# Patient Record
Sex: Female | Born: 1935
Health system: Southern US, Community
[De-identification: ages and names within clinical notes are randomized; demographics above are authoritative.]

## PROBLEM LIST (undated history)

## (undated) DIAGNOSIS — R51 Headache: Secondary | ICD-10-CM

## (undated) DIAGNOSIS — R112 Nausea with vomiting, unspecified: Secondary | ICD-10-CM

## (undated) DIAGNOSIS — R519 Headache, unspecified: Secondary | ICD-10-CM

## (undated) DIAGNOSIS — E785 Hyperlipidemia, unspecified: Secondary | ICD-10-CM

## (undated) DIAGNOSIS — K219 Gastro-esophageal reflux disease without esophagitis: Secondary | ICD-10-CM

## (undated) DIAGNOSIS — J189 Pneumonia, unspecified organism: Secondary | ICD-10-CM

## (undated) DIAGNOSIS — Z9889 Other specified postprocedural states: Secondary | ICD-10-CM

## (undated) DIAGNOSIS — M1990 Unspecified osteoarthritis, unspecified site: Secondary | ICD-10-CM

## (undated) HISTORY — DX: Headache, unspecified: R51.9

## (undated) HISTORY — PX: ANKLE SURGERY: SHX546

## (undated) HISTORY — DX: Gastro-esophageal reflux disease without esophagitis: K21.9

## (undated) HISTORY — PX: ABDOMINAL HYSTERECTOMY: SHX81

## (undated) HISTORY — DX: Unspecified osteoarthritis, unspecified site: M19.90

## (undated) HISTORY — DX: Hyperlipidemia, unspecified: E78.5

## (undated) HISTORY — PX: EYE SURGERY: SHX253

## (undated) HISTORY — DX: Headache: R51

## (undated) HISTORY — PX: WISDOM TOOTH EXTRACTION: SHX21

---
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
## 1998-05-08 HISTORY — PX: OTHER SURGICAL HISTORY: SHX169

## 2011-05-09 HISTORY — PX: OTHER SURGICAL HISTORY: SHX169

## 2012-09-18 LAB — PULMONARY FUNCTION TEST

## 2013-05-19 DIAGNOSIS — M1610 Unilateral primary osteoarthritis, unspecified hip: Secondary | ICD-10-CM | POA: Insufficient documentation

## 2015-06-15 DIAGNOSIS — Z96649 Presence of unspecified artificial hip joint: Secondary | ICD-10-CM | POA: Insufficient documentation

## 2015-06-15 DIAGNOSIS — M7062 Trochanteric bursitis, left hip: Secondary | ICD-10-CM | POA: Insufficient documentation

## 2016-05-09 DIAGNOSIS — M545 Low back pain: Secondary | ICD-10-CM | POA: Diagnosis not present

## 2016-05-09 DIAGNOSIS — M9903 Segmental and somatic dysfunction of lumbar region: Secondary | ICD-10-CM | POA: Diagnosis not present

## 2016-05-09 DIAGNOSIS — M791 Myalgia: Secondary | ICD-10-CM | POA: Diagnosis not present

## 2016-05-09 DIAGNOSIS — M6240 Contracture of muscle, unspecified site: Secondary | ICD-10-CM | POA: Diagnosis not present

## 2016-05-15 DIAGNOSIS — M9903 Segmental and somatic dysfunction of lumbar region: Secondary | ICD-10-CM | POA: Diagnosis not present

## 2016-05-15 DIAGNOSIS — M791 Myalgia: Secondary | ICD-10-CM | POA: Diagnosis not present

## 2016-05-15 DIAGNOSIS — M545 Low back pain: Secondary | ICD-10-CM | POA: Diagnosis not present

## 2016-05-15 DIAGNOSIS — M6240 Contracture of muscle, unspecified site: Secondary | ICD-10-CM | POA: Diagnosis not present

## 2016-05-18 DIAGNOSIS — M6240 Contracture of muscle, unspecified site: Secondary | ICD-10-CM | POA: Diagnosis not present

## 2016-05-18 DIAGNOSIS — M545 Low back pain: Secondary | ICD-10-CM | POA: Diagnosis not present

## 2016-05-18 DIAGNOSIS — M791 Myalgia: Secondary | ICD-10-CM | POA: Diagnosis not present

## 2016-05-18 DIAGNOSIS — M9903 Segmental and somatic dysfunction of lumbar region: Secondary | ICD-10-CM | POA: Diagnosis not present

## 2016-05-22 DIAGNOSIS — M6240 Contracture of muscle, unspecified site: Secondary | ICD-10-CM | POA: Diagnosis not present

## 2016-05-22 DIAGNOSIS — M791 Myalgia: Secondary | ICD-10-CM | POA: Diagnosis not present

## 2016-05-22 DIAGNOSIS — M545 Low back pain: Secondary | ICD-10-CM | POA: Diagnosis not present

## 2016-05-22 DIAGNOSIS — M9903 Segmental and somatic dysfunction of lumbar region: Secondary | ICD-10-CM | POA: Diagnosis not present

## 2016-05-29 DIAGNOSIS — J209 Acute bronchitis, unspecified: Secondary | ICD-10-CM | POA: Diagnosis not present

## 2016-06-08 DIAGNOSIS — M9903 Segmental and somatic dysfunction of lumbar region: Secondary | ICD-10-CM | POA: Diagnosis not present

## 2016-07-06 DIAGNOSIS — B351 Tinea unguium: Secondary | ICD-10-CM | POA: Diagnosis not present

## 2016-07-06 DIAGNOSIS — M79675 Pain in left toe(s): Secondary | ICD-10-CM | POA: Diagnosis not present

## 2016-07-06 DIAGNOSIS — M9903 Segmental and somatic dysfunction of lumbar region: Secondary | ICD-10-CM | POA: Diagnosis not present

## 2016-07-06 DIAGNOSIS — M79674 Pain in right toe(s): Secondary | ICD-10-CM | POA: Diagnosis not present

## 2016-08-17 DIAGNOSIS — Z79899 Other long term (current) drug therapy: Secondary | ICD-10-CM | POA: Diagnosis not present

## 2016-08-17 DIAGNOSIS — E782 Mixed hyperlipidemia: Secondary | ICD-10-CM | POA: Diagnosis not present

## 2016-08-18 LAB — CBC AND DIFFERENTIAL
HCT: 42 (ref 36–46)
Hemoglobin: 13.7 (ref 12.0–16.0)
Platelets: 235 (ref 150–399)
WBC: 4.8

## 2016-08-24 DIAGNOSIS — M545 Low back pain: Secondary | ICD-10-CM | POA: Diagnosis not present

## 2016-08-24 DIAGNOSIS — T7840XA Allergy, unspecified, initial encounter: Secondary | ICD-10-CM | POA: Diagnosis not present

## 2016-08-24 DIAGNOSIS — E782 Mixed hyperlipidemia: Secondary | ICD-10-CM | POA: Diagnosis not present

## 2016-08-24 DIAGNOSIS — M810 Age-related osteoporosis without current pathological fracture: Secondary | ICD-10-CM | POA: Diagnosis not present

## 2016-08-29 DIAGNOSIS — M5416 Radiculopathy, lumbar region: Secondary | ICD-10-CM | POA: Diagnosis not present

## 2016-09-06 DIAGNOSIS — M47816 Spondylosis without myelopathy or radiculopathy, lumbar region: Secondary | ICD-10-CM | POA: Diagnosis not present

## 2016-09-06 DIAGNOSIS — M5417 Radiculopathy, lumbosacral region: Secondary | ICD-10-CM | POA: Diagnosis not present

## 2016-09-15 DIAGNOSIS — B351 Tinea unguium: Secondary | ICD-10-CM | POA: Diagnosis not present

## 2016-09-15 DIAGNOSIS — M79675 Pain in left toe(s): Secondary | ICD-10-CM | POA: Diagnosis not present

## 2016-09-15 DIAGNOSIS — M79674 Pain in right toe(s): Secondary | ICD-10-CM | POA: Diagnosis not present

## 2016-10-19 DIAGNOSIS — M48062 Spinal stenosis, lumbar region with neurogenic claudication: Secondary | ICD-10-CM | POA: Diagnosis not present

## 2016-10-19 DIAGNOSIS — M25559 Pain in unspecified hip: Secondary | ICD-10-CM | POA: Diagnosis not present

## 2016-10-19 DIAGNOSIS — M4807 Spinal stenosis, lumbosacral region: Secondary | ICD-10-CM | POA: Diagnosis not present

## 2016-10-19 DIAGNOSIS — M545 Low back pain: Secondary | ICD-10-CM | POA: Diagnosis not present

## 2016-10-19 DIAGNOSIS — M79605 Pain in left leg: Secondary | ICD-10-CM | POA: Diagnosis not present

## 2016-11-17 DIAGNOSIS — M79675 Pain in left toe(s): Secondary | ICD-10-CM | POA: Diagnosis not present

## 2016-11-17 DIAGNOSIS — M79674 Pain in right toe(s): Secondary | ICD-10-CM | POA: Diagnosis not present

## 2016-11-17 DIAGNOSIS — B351 Tinea unguium: Secondary | ICD-10-CM | POA: Diagnosis not present

## 2016-11-28 DIAGNOSIS — M5416 Radiculopathy, lumbar region: Secondary | ICD-10-CM | POA: Diagnosis not present

## 2016-11-30 DIAGNOSIS — K449 Diaphragmatic hernia without obstruction or gangrene: Secondary | ICD-10-CM | POA: Diagnosis not present

## 2016-11-30 DIAGNOSIS — D131 Benign neoplasm of stomach: Secondary | ICD-10-CM | POA: Diagnosis not present

## 2016-11-30 DIAGNOSIS — Z0389 Encounter for observation for other suspected diseases and conditions ruled out: Secondary | ICD-10-CM | POA: Diagnosis not present

## 2016-11-30 DIAGNOSIS — Z8719 Personal history of other diseases of the digestive system: Secondary | ICD-10-CM | POA: Diagnosis not present

## 2016-11-30 DIAGNOSIS — K2970 Gastritis, unspecified, without bleeding: Secondary | ICD-10-CM | POA: Diagnosis not present

## 2016-11-30 DIAGNOSIS — K317 Polyp of stomach and duodenum: Secondary | ICD-10-CM | POA: Diagnosis not present

## 2016-11-30 DIAGNOSIS — K2270 Barrett's esophagus without dysplasia: Secondary | ICD-10-CM | POA: Diagnosis not present

## 2016-11-30 DIAGNOSIS — K219 Gastro-esophageal reflux disease without esophagitis: Secondary | ICD-10-CM | POA: Diagnosis not present

## 2016-11-30 DIAGNOSIS — K2950 Unspecified chronic gastritis without bleeding: Secondary | ICD-10-CM | POA: Diagnosis not present

## 2016-11-30 DIAGNOSIS — B9681 Helicobacter pylori [H. pylori] as the cause of diseases classified elsewhere: Secondary | ICD-10-CM | POA: Diagnosis not present

## 2016-12-01 DIAGNOSIS — M5416 Radiculopathy, lumbar region: Secondary | ICD-10-CM | POA: Diagnosis not present

## 2017-01-04 DIAGNOSIS — H3540 Unspecified peripheral retinal degeneration: Secondary | ICD-10-CM | POA: Diagnosis not present

## 2017-01-04 DIAGNOSIS — H43813 Vitreous degeneration, bilateral: Secondary | ICD-10-CM | POA: Diagnosis not present

## 2017-01-26 DIAGNOSIS — M79674 Pain in right toe(s): Secondary | ICD-10-CM | POA: Diagnosis not present

## 2017-01-26 DIAGNOSIS — B351 Tinea unguium: Secondary | ICD-10-CM | POA: Diagnosis not present

## 2017-01-26 DIAGNOSIS — M79675 Pain in left toe(s): Secondary | ICD-10-CM | POA: Diagnosis not present

## 2017-02-08 DIAGNOSIS — B9681 Helicobacter pylori [H. pylori] as the cause of diseases classified elsewhere: Secondary | ICD-10-CM | POA: Diagnosis not present

## 2017-02-08 DIAGNOSIS — K219 Gastro-esophageal reflux disease without esophagitis: Secondary | ICD-10-CM | POA: Diagnosis not present

## 2017-02-09 DIAGNOSIS — B9681 Helicobacter pylori [H. pylori] as the cause of diseases classified elsewhere: Secondary | ICD-10-CM | POA: Diagnosis not present

## 2017-02-21 DIAGNOSIS — Z23 Encounter for immunization: Secondary | ICD-10-CM | POA: Diagnosis not present

## 2017-02-22 DIAGNOSIS — E782 Mixed hyperlipidemia: Secondary | ICD-10-CM | POA: Diagnosis not present

## 2017-02-22 DIAGNOSIS — M810 Age-related osteoporosis without current pathological fracture: Secondary | ICD-10-CM | POA: Diagnosis not present

## 2017-02-22 DIAGNOSIS — Z1231 Encounter for screening mammogram for malignant neoplasm of breast: Secondary | ICD-10-CM | POA: Diagnosis not present

## 2017-02-22 DIAGNOSIS — Z79899 Other long term (current) drug therapy: Secondary | ICD-10-CM | POA: Diagnosis not present

## 2017-02-22 DIAGNOSIS — F320 Major depressive disorder, single episode, mild: Secondary | ICD-10-CM | POA: Diagnosis not present

## 2017-02-22 DIAGNOSIS — Z0001 Encounter for general adult medical examination with abnormal findings: Secondary | ICD-10-CM | POA: Diagnosis not present

## 2017-02-23 LAB — LIPID PANEL
Cholesterol: 172 (ref 0–200)
HDL: 89 — AB (ref 35–70)
LDL CALC: 67
Triglycerides: 81 (ref 40–160)

## 2017-02-23 LAB — BASIC METABOLIC PANEL
BUN: 19 (ref 4–21)
Creatinine: 0.6 (ref 0.5–1.1)
GLUCOSE: 88
POTASSIUM: 3.9 (ref 3.4–5.3)
SODIUM: 143 (ref 137–147)

## 2017-02-23 LAB — HEPATIC FUNCTION PANEL
ALT: 16 (ref 7–35)
AST: 27 (ref 13–35)
Alkaline Phosphatase: 64 (ref 25–125)
BILIRUBIN, TOTAL: 0.7

## 2017-03-01 DIAGNOSIS — Z79899 Other long term (current) drug therapy: Secondary | ICD-10-CM | POA: Diagnosis not present

## 2017-03-01 DIAGNOSIS — M810 Age-related osteoporosis without current pathological fracture: Secondary | ICD-10-CM | POA: Diagnosis not present

## 2017-03-01 DIAGNOSIS — E782 Mixed hyperlipidemia: Secondary | ICD-10-CM | POA: Diagnosis not present

## 2017-03-01 DIAGNOSIS — K2270 Barrett's esophagus without dysplasia: Secondary | ICD-10-CM | POA: Diagnosis not present

## 2017-03-20 DIAGNOSIS — Z1231 Encounter for screening mammogram for malignant neoplasm of breast: Secondary | ICD-10-CM | POA: Diagnosis not present

## 2017-03-20 LAB — HM MAMMOGRAPHY

## 2017-03-22 DIAGNOSIS — H52223 Regular astigmatism, bilateral: Secondary | ICD-10-CM | POA: Diagnosis not present

## 2017-03-22 DIAGNOSIS — Z961 Presence of intraocular lens: Secondary | ICD-10-CM | POA: Diagnosis not present

## 2017-03-22 DIAGNOSIS — H43393 Other vitreous opacities, bilateral: Secondary | ICD-10-CM | POA: Diagnosis not present

## 2017-03-22 DIAGNOSIS — H524 Presbyopia: Secondary | ICD-10-CM | POA: Diagnosis not present

## 2017-03-22 DIAGNOSIS — H31091 Other chorioretinal scars, right eye: Secondary | ICD-10-CM | POA: Diagnosis not present

## 2017-03-22 DIAGNOSIS — H1045 Other chronic allergic conjunctivitis: Secondary | ICD-10-CM | POA: Diagnosis not present

## 2017-03-23 DIAGNOSIS — L814 Other melanin hyperpigmentation: Secondary | ICD-10-CM | POA: Diagnosis not present

## 2017-03-23 DIAGNOSIS — L821 Other seborrheic keratosis: Secondary | ICD-10-CM | POA: Diagnosis not present

## 2017-03-23 DIAGNOSIS — D1801 Hemangioma of skin and subcutaneous tissue: Secondary | ICD-10-CM | POA: Diagnosis not present

## 2017-04-02 DIAGNOSIS — M79674 Pain in right toe(s): Secondary | ICD-10-CM | POA: Diagnosis not present

## 2017-04-02 DIAGNOSIS — M79675 Pain in left toe(s): Secondary | ICD-10-CM | POA: Diagnosis not present

## 2017-04-02 DIAGNOSIS — Z6826 Body mass index (BMI) 26.0-26.9, adult: Secondary | ICD-10-CM | POA: Diagnosis not present

## 2017-04-02 DIAGNOSIS — B351 Tinea unguium: Secondary | ICD-10-CM | POA: Diagnosis not present

## 2017-04-18 DIAGNOSIS — M810 Age-related osteoporosis without current pathological fracture: Secondary | ICD-10-CM | POA: Diagnosis not present

## 2017-06-04 DIAGNOSIS — M79674 Pain in right toe(s): Secondary | ICD-10-CM | POA: Diagnosis not present

## 2017-06-04 DIAGNOSIS — B351 Tinea unguium: Secondary | ICD-10-CM | POA: Diagnosis not present

## 2017-06-04 DIAGNOSIS — M79675 Pain in left toe(s): Secondary | ICD-10-CM | POA: Diagnosis not present

## 2017-06-04 DIAGNOSIS — Z6826 Body mass index (BMI) 26.0-26.9, adult: Secondary | ICD-10-CM | POA: Diagnosis not present

## 2017-08-06 DIAGNOSIS — Z6826 Body mass index (BMI) 26.0-26.9, adult: Secondary | ICD-10-CM | POA: Diagnosis not present

## 2017-08-06 DIAGNOSIS — M79675 Pain in left toe(s): Secondary | ICD-10-CM | POA: Diagnosis not present

## 2017-08-06 DIAGNOSIS — M79674 Pain in right toe(s): Secondary | ICD-10-CM | POA: Diagnosis not present

## 2017-08-06 DIAGNOSIS — B351 Tinea unguium: Secondary | ICD-10-CM | POA: Diagnosis not present

## 2017-08-06 DIAGNOSIS — M810 Age-related osteoporosis without current pathological fracture: Secondary | ICD-10-CM | POA: Diagnosis not present

## 2017-08-06 DIAGNOSIS — E782 Mixed hyperlipidemia: Secondary | ICD-10-CM | POA: Diagnosis not present

## 2017-08-06 DIAGNOSIS — Z79899 Other long term (current) drug therapy: Secondary | ICD-10-CM | POA: Diagnosis not present

## 2017-08-14 DIAGNOSIS — M810 Age-related osteoporosis without current pathological fracture: Secondary | ICD-10-CM | POA: Diagnosis not present

## 2017-08-14 DIAGNOSIS — E782 Mixed hyperlipidemia: Secondary | ICD-10-CM | POA: Diagnosis not present

## 2017-08-21 DIAGNOSIS — M5416 Radiculopathy, lumbar region: Secondary | ICD-10-CM | POA: Diagnosis not present

## 2017-09-10 ENCOUNTER — Encounter: Payer: Self-pay | Admitting: Family Medicine

## 2017-09-10 ENCOUNTER — Ambulatory Visit (INDEPENDENT_AMBULATORY_CARE_PROVIDER_SITE_OTHER): Payer: Medicare Other | Admitting: Family Medicine

## 2017-09-10 VITALS — BP 112/68 | HR 71 | Temp 98.1°F | Ht 63.5 in | Wt 148.5 lb

## 2017-09-10 DIAGNOSIS — Z7689 Persons encountering health services in other specified circumstances: Secondary | ICD-10-CM

## 2017-09-10 DIAGNOSIS — K219 Gastro-esophageal reflux disease without esophagitis: Secondary | ICD-10-CM

## 2017-09-10 DIAGNOSIS — L600 Ingrowing nail: Secondary | ICD-10-CM

## 2017-09-10 DIAGNOSIS — M1990 Unspecified osteoarthritis, unspecified site: Secondary | ICD-10-CM

## 2017-09-10 DIAGNOSIS — E785 Hyperlipidemia, unspecified: Secondary | ICD-10-CM

## 2017-09-10 DIAGNOSIS — B351 Tinea unguium: Secondary | ICD-10-CM

## 2017-09-10 DIAGNOSIS — E2839 Other primary ovarian failure: Secondary | ICD-10-CM

## 2017-09-10 NOTE — Progress Notes (Signed)
Subjective:    Patient ID: Sherry Lamb, female    DOB: 01-21-1935, 83 y.o.   MRN: [id number]  HPI This is an 83 yo female who presents today to establish care. She is accompanied by her husband who is also establishing care. She has recently moved to the area from [facility] to be close to family.   Last CPE- two weeks ago Mammo- 10/18  Stomach has been upset with move. Feels unsettled. Takes omeprazole daily. Had EGD/colonscopy, was dx with h. Pylori and had treatment. Improvement of symptoms following treatment.   Intermittent back and hip pain- has done well in past with occasional injections. Requesting names of someone who may be able to help her if she has a flare.   Requesting referral to podiatrist for thickened nails/pain of feet.   She denies chest pain, SOB, fatigue, nausea/vomiting/diarrhea/constipation.   Past Medical History:  Diagnosis Date  . Arthritis   . Frequent headaches   . GERD (gastroesophageal reflux disease)   . Hyperlipidemia    Past Surgical History:  Procedure Laterality Date  . ABDOMINAL HYSTERECTOMY    . EYE SURGERY    . Left hip replacement Left 2013  . right hip replacement Right 2000  . TONSILLECTOMY AND ADENOIDECTOMY  1943     Family History  Problem Relation Age of Onset  . Arthritis Mother   . Asthma Mother   . Hearing loss Mother   . Early death Father   . Heart disease Father   . Hyperlipidemia Father   . Kidney disease Father   . Depression Brother   . Hyperlipidemia Brother    Social History   Tobacco Use  . Smoking status: Never Smoker  . Smokeless tobacco: Never Used  Substance Use Topics  . Alcohol use: Yes  . Drug use: Never       Review of Systems Per HPI    Objective:   Physical Exam  Constitutional: She is oriented to person, place, and time. She appears well-developed and well-nourished.  HENT:  Head: Normocephalic and atraumatic.  Eyes: Conjunctivae are normal.  Cardiovascular: Normal rate, regular  rhythm and normal heart sounds.  Pulmonary/Chest: Effort normal and breath sounds normal.  Musculoskeletal: She exhibits no edema.  Neurological: She is alert and oriented to person, place, and time.  Skin: Skin is warm and dry.  Psychiatric: She has a normal mood and affect. Her behavior is normal. Thought content normal.  Vitals reviewed.    BP 112/68 (BP Location: Right Arm, Patient Position: Sitting, Cuff Size: Normal)   Pulse 71   Temp 98.1 F (36.7 C) (Oral)   Ht 5' 3.5" (1.613 m)   Wt 148 lb 8 oz (67.4 kg)   SpO2 96%   BMI 25.89 kg/m   Depression screen [facility] 2/9 09/10/2017  Decreased Interest 0  Down, Depressed, Hopeless 0  PHQ - 2 Score 0       Assessment & Plan:  1. Encounter to establish care - will request records   2. Onychomycosis with ingrown toenail - Ambulatory referral to Podiatry  3. Estrogen deficiency - DG Bone Density; Future  4. Hyperlipidemia, unspecified hyperlipidemia type - has had recent labs, currently on rosuvastatin 10 mg  5. Gastroesophageal reflux disease, esophagitis presence not specified - continue omeprazole 20 mg, avoid triggers  6. Arthritis - currently pain well controlled, function good - provided her with names of local physiatrists and offered referral. She will let me know if she needs referral in future.   -  follow up in 6 months  Clarene Reamer, FNP-BC  [facility], [place]  09/16/2017 8:55 PM

## 2017-09-10 NOTE — Patient Instructions (Signed)
Good to meet you today  I have placed a referral for podiatry and order for bone scan, please stop at front desk to schedule  Please follow up in 6 months  Physiatrists in the area-  Beemer, Suella Broad, Laroy Apple, [facility]- [phone]

## 2017-09-12 ENCOUNTER — Telehealth: Payer: Self-pay | Admitting: Family Medicine

## 2017-09-12 NOTE — Telephone Encounter (Signed)
Patient brought by handicapped placard form.  Patient has had 2 hip replacements.  Patient asked for form to be faxed to the [facility] at fax number [phone] when form is complete. Form is in rx tower.

## 2017-09-12 NOTE — Telephone Encounter (Signed)
Noted  

## 2017-09-12 NOTE — Telephone Encounter (Signed)
Called and made patient aware that form has been completed and faxed. Form placed up front for patient to pick up. Nothing further needed.

## 2017-09-16 ENCOUNTER — Encounter: Payer: Self-pay | Admitting: Family Medicine

## 2017-09-16 DIAGNOSIS — E785 Hyperlipidemia, unspecified: Secondary | ICD-10-CM | POA: Insufficient documentation

## 2017-09-16 DIAGNOSIS — R51 Headache: Secondary | ICD-10-CM

## 2017-09-16 DIAGNOSIS — K219 Gastro-esophageal reflux disease without esophagitis: Secondary | ICD-10-CM | POA: Insufficient documentation

## 2017-09-16 DIAGNOSIS — M1990 Unspecified osteoarthritis, unspecified site: Secondary | ICD-10-CM | POA: Insufficient documentation

## 2017-09-16 DIAGNOSIS — R519 Headache, unspecified: Secondary | ICD-10-CM | POA: Insufficient documentation

## 2017-09-18 ENCOUNTER — Other Ambulatory Visit: Payer: Self-pay | Admitting: Family Medicine

## 2017-09-18 DIAGNOSIS — Z1231 Encounter for screening mammogram for malignant neoplasm of breast: Secondary | ICD-10-CM

## 2017-10-03 ENCOUNTER — Encounter: Payer: Self-pay | Admitting: Family Medicine

## 2017-10-04 ENCOUNTER — Ambulatory Visit (INDEPENDENT_AMBULATORY_CARE_PROVIDER_SITE_OTHER): Payer: Medicare Other | Admitting: Internal Medicine

## 2017-10-04 ENCOUNTER — Encounter: Payer: Self-pay | Admitting: Internal Medicine

## 2017-10-04 VITALS — BP 114/74 | HR 84 | Temp 98.1°F | Wt 147.0 lb

## 2017-10-04 DIAGNOSIS — J3089 Other allergic rhinitis: Secondary | ICD-10-CM | POA: Diagnosis not present

## 2017-10-05 ENCOUNTER — Encounter: Payer: Self-pay | Admitting: Internal Medicine

## 2017-10-05 ENCOUNTER — Ambulatory Visit: Payer: Medicare Other | Admitting: Family Medicine

## 2017-10-05 ENCOUNTER — Ambulatory Visit: Payer: Medicare Other | Admitting: Internal Medicine

## 2017-10-05 NOTE — Progress Notes (Signed)
HPI  Pt presents to the clinic today with c/o nasal congestion, ear fullness and cough. Shee reports this started 2 weeks ago. Shee is blowing clear mucous out of his nose. She denies ear pain or decreased hearing. The cough is non productive. She denies fever, chills or body aches. She has tried nasal saline and an antihistamine OTC. She has no history of allergies. She has not had sick contacts that she is aware of.  Review of Systems      Past Medical History:  Diagnosis Date  . Arthritis   . Frequent headaches   . GERD (gastroesophageal reflux disease)   . Hyperlipidemia     Family History  Problem Relation Age of Onset  . Arthritis Mother   . Asthma Mother   . Hearing loss Mother   . Early death Father   . Heart disease Father   . Hyperlipidemia Father   . Kidney disease Father   . Depression Brother   . Hyperlipidemia Brother     Social History   Socioeconomic History  . Marital status: Married    Spouse name: Not on file  . Number of children: Not on file  . Years of education: Not on file  . Highest education level: Not on file  Occupational History  . Not on file  Social Needs  . Financial resource strain: Not on file  . Food insecurity:    Worry: Not on file    Inability: Not on file  . Transportation needs:    Medical: Not on file    Non-medical: Not on file  Tobacco Use  . Smoking status: Never Smoker  . Smokeless tobacco: Never Used  Substance and Sexual Activity  . Alcohol use: Yes  . Drug use: Never  . Sexual activity: Yes    Partners: Male  Lifestyle  . Physical activity:    Days per week: Not on file    Minutes per session: Not on file  . Stress: Not on file  Relationships  . Social connections:    Talks on phone: Not on file    Gets together: Not on file    Attends religious service: Not on file    Active member of club or organization: Not on file    Attends meetings of clubs or organizations: Not on file    Relationship status:  Not on file  . Intimate partner violence:    Fear of current or ex partner: Not on file    Emotionally abused: Not on file    Physically abused: Not on file    Forced sexual activity: Not on file  Other Topics Concern  . Not on file  Social History Narrative   Married to [employer] [facility] from [facility] 2019 to be closer to family    Allergies  Allergen Reactions  . Penicillins Other (See Comments)    Stomach tightness.     Constitutional:Denies headache, fatigue, fever or abrupt weight changes.  HEENT:  Positive ear fullness, nasal congestion. Denies eye redness, eye pain, pressure behind the eyes, facial pain, ear pain, ringing in the ears, wax buildup, runny nose or bloody nose. Respiratory: Positive cough. Denies difficulty breathing or shortness of breath.  Cardiovascular: Denies chest pain, chest tightness, palpitations or swelling in the hands or feet.   No other specific complaints in a complete review of systems (except as listed in HPI above).  Objective:   BP 114/74   Pulse 84   Temp 98.1  F (36.7 C) (Oral)   Wt 147 lb (66.7 kg)   SpO2 97%   BMI 25.63 kg/m  Wt Readings from Last 3 Encounters:  10/04/17 147 lb (66.7 kg)  09/10/17 148 lb 8 oz (67.4 kg)     General: Appears her stated age, in NAD. HEENT: Head: normal shape and size;  Ears: Tm's gray and intact, normal light reflex; Nose: mucosa pink and moist, septum midline; Throat/Mouth: + PND. Teeth present, mucosa erythematous and moist, no exudate noted, no lesions or ulcerations noted.  Neck: No cervical lymphadenopathy.  Pulmonary/Chest: Normal effort and positive vesicular breath sounds. No respiratory distress. No wheezes, rales or ronchi noted.       Assessment & Plan:   Allergies:  Get some rest and drink plenty of water Encouraged her to continue antihistamine OTC Advised her to try Flonase instead of nasal saline  RTC as needed or if symptoms persist.   Webb Silversmith, NP

## 2017-10-05 NOTE — Patient Instructions (Signed)
Allergies An allergy is when your body reacts to a substance in a way that is not normal. An allergic reaction can happen after you:  Eat something.  Breathe in something.  Touch something.  You can be allergic to:  Things that are only around during certain seasons, like molds and pollens.  Foods.  Drugs.  Insects.  Animal dander.  What are the signs or symptoms?  Puffiness (swelling). This may happen on the lips, face, tongue, mouth, or throat.  Sneezing.  Coughing.  Breathing loudly (wheezing).  Stuffy nose.  Tingling in the mouth.  A rash.  Itching.  Itchy, red, puffy areas of skin (hives).  Watery eyes.  Throwing up (vomiting).  Watery poop (diarrhea).  Dizziness.  Feeling faint or fainting.  Trouble breathing or swallowing.  A tight feeling in the chest.  A fast heartbeat. How is this diagnosed? Allergies can be diagnosed with:  A medical and family history.  Skin tests.  Blood tests.  A food diary. A food diary is a record of all the foods, drinks, and symptoms you have each day.  The results of an elimination diet. This diet involves making sure not to eat certain foods and then seeing what happens when you start eating them again.  How is this treated? There is no cure for allergies, but allergic reactions can be treated with medicine. Severe reactions usually need to be treated at a hospital. How is this prevented? The best way to prevent an allergic reaction is to avoid the thing you are allergic to. Allergy shots and medicines can also help prevent reactions in some cases. This information is not intended to replace advice given to you by your health care provider. Make sure you discuss any questions you have with your health care provider. Document Released: 08/19/2012 Document Revised: 12/20/2015 Document Reviewed: 02/03/2014 Elsevier Interactive Patient Education  2018 Elsevier Inc.  

## 2017-10-17 DIAGNOSIS — H1031 Unspecified acute conjunctivitis, right eye: Secondary | ICD-10-CM | POA: Diagnosis not present

## 2017-11-05 ENCOUNTER — Encounter: Payer: Self-pay | Admitting: Podiatry

## 2017-11-05 ENCOUNTER — Ambulatory Visit (INDEPENDENT_AMBULATORY_CARE_PROVIDER_SITE_OTHER): Payer: Medicare Other | Admitting: Podiatry

## 2017-11-05 VITALS — BP 144/91 | HR 77

## 2017-11-05 DIAGNOSIS — M79676 Pain in unspecified toe(s): Secondary | ICD-10-CM

## 2017-11-05 DIAGNOSIS — B351 Tinea unguium: Secondary | ICD-10-CM

## 2017-11-11 NOTE — Progress Notes (Signed)
   SUBJECTIVE Patient presents to office today complaining of elongated, thickened nails that cause pain while ambulating in shoes. She is unable to trim her own nails. Patient is here for further evaluation and treatment.  Past Medical History:  Diagnosis Date  . Arthritis   . Frequent headaches   . GERD (gastroesophageal reflux disease)   . Hyperlipidemia     OBJECTIVE General Patient is awake, alert, and oriented x 3 and in no acute distress. Derm Skin is dry and supple bilateral. Negative open lesions or macerations. Remaining integument unremarkable. Nails are tender, long, thickened and dystrophic with subungual debris, consistent with onychomycosis, 1-5 bilateral. No signs of infection noted. Vasc  DP and PT pedal pulses palpable bilaterally. Temperature gradient within normal limits.  Neuro Epicritic and protective threshold sensation grossly intact bilaterally.  Musculoskeletal Exam No symptomatic pedal deformities noted bilateral. Muscular strength within normal limits.  ASSESSMENT 1. Onychodystrophic nails 1-5 bilateral with hyperkeratosis of nails.  2. Onychomycosis of nail due to dermatophyte bilateral 3. Pain in foot bilateral  PLAN OF CARE 1. Patient evaluated today.  2. Instructed to maintain good pedal hygiene and foot care.  3. Mechanical debridement of nails 1-5 bilaterally performed using a nail nipper. Filed with dremel without incident.  4. Return to clinic in 3 mos.    Edrick Kins, DPM [facility]  Dr. Edrick Kins, [address]                                        [place], [place] [address]                Office [phone]  Fax [phone]

## 2017-11-13 ENCOUNTER — Ambulatory Visit: Payer: Self-pay | Admitting: Podiatry

## 2017-11-19 ENCOUNTER — Other Ambulatory Visit: Payer: Self-pay | Admitting: Family Medicine

## 2017-11-19 MED ORDER — OMEPRAZOLE 20 MG PO CPDR: 20.0000 mg | DELAYED_RELEASE_CAPSULE | Freq: Every day | ORAL | 1 refills | Status: DC

## 2017-11-19 MED ORDER — ROSUVASTATIN CALCIUM 10 MG PO TABS: 10.0000 mg | ORAL_TABLET | Freq: Every day | ORAL | 1 refills | Status: DC

## 2017-11-19 NOTE — Telephone Encounter (Signed)
Pt notified refills done for crestor and prilosec; pt wanted to know if it would be on auto refill; advised pt to call pharmacy about setting up auto refill. Pt voiced understanding.

## 2017-11-19 NOTE — Telephone Encounter (Signed)
I advised pt that [facility] mail order does not come up when enter the information but when call the [phone] the recording said [facility] new prescription care service. Pt said she is on another call and to call her back when refill is complete. Pt established as new pt on 09/10/17. Last lipid lab 02/23/17.Please advise.

## 2017-11-19 NOTE — Telephone Encounter (Signed)
Copied from [place] [id number]. Topic: Quick Communication - Rx Refill/Question >> Nov 19, 2017  2:54 PM Margot Ables wrote: Medication: rosuvastatin (CRESTOR) 10 MG tablet & omeprazole (PRILOSEC) 20 MG capsule - 1 1/2-2 weeks on hand - needs 90 day supply thru mail order pharmacy Has the patient contacted their pharmacy? Yes told to call doctor for new RX Preferred Pharmacy (with phone number or street name): [facility] [place], ph# [phone] fax# [phone]

## 2017-12-10 ENCOUNTER — Telehealth: Payer: Self-pay | Admitting: Family Medicine

## 2017-12-10 NOTE — Telephone Encounter (Signed)
Called and spoke with patient. She will wait until Nov. Nothing further needed at this time.

## 2017-12-10 NOTE — Telephone Encounter (Unsigned)
Copied from [place] [id number]. Topic: Quick Communication - See Telephone Encounter >> Dec 10, 2017  9:37 AM Percell Belt A wrote: CRM for notification. See Telephone encounter for: 12/10/17.   Pt called in and read something about the omeprazole (PRILOSEC) 20 MG capsule [[id number]] that it should not be taking for long periods of time?  She Is concerned about this med and stated that last dr increased it to 46 and not sure why?    Pt will be out this morning and said cell number would be the best number to get her

## 2017-12-10 NOTE — Telephone Encounter (Signed)
Please call patient and tell her that I recommend she schedule an office visit to discuss her omeprazole, possible side effects and whether or not she should decrease the dose. It is fine if she would like to wait until her November appointment, but if she is concerned, I am happy to see her sooner.

## 2018-02-07 ENCOUNTER — Encounter: Payer: Self-pay | Admitting: Podiatry

## 2018-02-07 ENCOUNTER — Ambulatory Visit (INDEPENDENT_AMBULATORY_CARE_PROVIDER_SITE_OTHER): Payer: Medicare Other | Admitting: Podiatry

## 2018-02-07 DIAGNOSIS — M79676 Pain in unspecified toe(s): Secondary | ICD-10-CM | POA: Diagnosis not present

## 2018-02-07 DIAGNOSIS — M1990 Unspecified osteoarthritis, unspecified site: Secondary | ICD-10-CM | POA: Diagnosis not present

## 2018-02-07 DIAGNOSIS — B351 Tinea unguium: Secondary | ICD-10-CM | POA: Diagnosis not present

## 2018-02-07 NOTE — Progress Notes (Signed)
Complaint:  Visit Type: Patient returns to my office for continued preventative foot care services. Complaint: Patient states" my nails have grown long and thick and become painful to walk and wear shoes" Patient has been dealing with ingrown nails right big toe.. The patient presents for preventative foot care services. No changes to ROS  Podiatric Exam: Vascular: dorsalis pedis and posterior tibial pulses are palpable bilateral. Capillary return is immediate. Temperature gradient is WNL. Skin turgor WNL  Sensorium: Normal Semmes Weinstein monofilament test. Normal tactile sensation bilaterally. Nail Exam: Pt has thick disfigured discolored nails with subungual debris noted bilateral entire nail hallux through fifth toenails.  Pincer nail right hallux. Ulcer Exam: There is no evidence of ulcer or pre-ulcerative changes or infection. Orthopedic Exam: Muscle tone and strength are WNL. No limitations in general ROM. No crepitus or effusions noted. Foot type and digits show no abnormalities. Bony prominences are unremarkable. Skin: No Porokeratosis. No infection or ulcers  Diagnosis:  Onychomycosis, , Pain in right toe, pain in left toes  Treatment & Plan Procedures and Treatment: Consent by patient was obtained for treatment procedures.   Debridement of mycotic and hypertrophic toenails, 1 through 5 bilateral and clearing of subungual debris. No ulceration, no infection noted.  Return Visit-Office Procedure: Patient instructed to return to the office for a follow up visit 3 months for continued evaluation and treatment.    Gardiner Barefoot DPM

## 2018-02-13 ENCOUNTER — Encounter: Payer: Self-pay | Admitting: Family Medicine

## 2018-02-13 ENCOUNTER — Ambulatory Visit (INDEPENDENT_AMBULATORY_CARE_PROVIDER_SITE_OTHER): Payer: Medicare Other | Admitting: Family Medicine

## 2018-02-13 VITALS — BP 116/70 | HR 94 | Temp 97.4°F | Ht 63.5 in | Wt 152.0 lb

## 2018-02-13 DIAGNOSIS — E785 Hyperlipidemia, unspecified: Secondary | ICD-10-CM

## 2018-02-13 DIAGNOSIS — Y93E6 Activity, residential relocation: Secondary | ICD-10-CM | POA: Diagnosis not present

## 2018-02-13 NOTE — Patient Instructions (Signed)
Good to see you today!  

## 2018-02-13 NOTE — Progress Notes (Signed)
   Subjective:    Patient ID: Sherry Lamb, female    DOB: 1934-10-17, 83 y.o.   MRN: [id number]  HPI This is a 83 yo female who presents today with concerns about her medication, specifically whether or not she should continue her Crestor. She is currently on 10 mg daily and has not been having any side effects. She takes in the evening. Taking omeprazole in the morning.  She is still struggling with her relocation from  [facility], [place]. She feels that she did not have much say in her husband's decision to move. Misses her friends and activities.   Past Medical History:  Diagnosis Date  . Arthritis   . Frequent headaches   . GERD (gastroesophageal reflux disease)   . Hyperlipidemia    Past Surgical History:  Procedure Laterality Date  . ABDOMINAL HYSTERECTOMY    . EYE SURGERY    . Left hip replacement Left 2013  . right hip replacement Right 2000  . TONSILLECTOMY AND ADENOIDECTOMY  1943   Family History  Problem Relation Age of Onset  . Arthritis Mother   . Asthma Mother   . Hearing loss Mother   . Early death Father   . Heart disease Father   . Hyperlipidemia Father   . Kidney disease Father   . Depression Brother   . Hyperlipidemia Brother    Social History   Tobacco Use  . Smoking status: Never Smoker  . Smokeless tobacco: Never Used  Substance Use Topics  . Alcohol use: Yes  . Drug use: Never      Review of Systems Per HPI    Objective:   Physical Exam  Constitutional: She is oriented to person, place, and time. She appears well-developed and well-nourished.  Appears younger than stated age.   HENT:  Head: Normocephalic and atraumatic.  Eyes: Conjunctivae are normal.  Neck: Normal range of motion. Neck supple.  Cardiovascular: Normal rate.  Pulmonary/Chest: Effort normal.  Neurological: She is alert and oriented to person, place, and time.  Psychiatric: She has a normal mood and affect. Her behavior is normal. Judgment and thought content normal.    Vitals reviewed.     BP 116/70 (BP Location: Right Arm, Patient Position: Sitting, Cuff Size: Normal)   Pulse 94   Temp (!) 97.4 F (36.3 C) (Oral)   Ht 5' 3.5" (1.613 m)   Wt 152 lb (68.9 kg)   SpO2 96%   BMI 26.50 kg/m  Wt Readings from Last 3 Encounters:  02/13/18 152 lb (68.9 kg)  10/04/17 147 lb (66.7 kg)  09/10/17 148 lb 8 oz (67.4 kg)       Assessment & Plan:  1. Hyperlipidemia, unspecified hyperlipidemia type - reviewed her labs done earlier this year and she has had excellent response to Crestor - she agrees to continue, will let me know if she develops any side effect, may consider decreasing to 5 mg  2. Recent relocation to unfamiliar city - encouraged her to increase socialization and activities to help her feel more integrated in the community  - follow up on file  Clarene Reamer, FNP-BC  [facility] at [facility], [place]  02/17/2018 8:55 PM

## 2018-02-17 ENCOUNTER — Encounter: Payer: Self-pay | Admitting: Family Medicine

## 2018-03-13 ENCOUNTER — Ambulatory Visit (INDEPENDENT_AMBULATORY_CARE_PROVIDER_SITE_OTHER): Payer: Medicare Other | Admitting: Family Medicine

## 2018-03-13 ENCOUNTER — Encounter: Payer: Self-pay | Admitting: Family Medicine

## 2018-03-13 VITALS — BP 128/80 | HR 96 | Temp 98.3°F | Ht 63.5 in | Wt 151.4 lb

## 2018-03-13 DIAGNOSIS — Z23 Encounter for immunization: Secondary | ICD-10-CM

## 2018-03-13 DIAGNOSIS — B9789 Other viral agents as the cause of diseases classified elsewhere: Secondary | ICD-10-CM

## 2018-03-13 DIAGNOSIS — J069 Acute upper respiratory infection, unspecified: Secondary | ICD-10-CM | POA: Diagnosis not present

## 2018-03-13 DIAGNOSIS — E785 Hyperlipidemia, unspecified: Secondary | ICD-10-CM

## 2018-03-13 DIAGNOSIS — Z79899 Other long term (current) drug therapy: Secondary | ICD-10-CM | POA: Diagnosis not present

## 2018-03-13 LAB — LIPID PANEL
CHOLESTEROL: 165 mg/dL (ref 0–200)
HDL: 82.2 mg/dL (ref >39.00)
LDL Cholesterol: 70 mg/dL (ref 0–99)
NONHDL: 82.58
TRIGLYCERIDES: 64 mg/dL (ref 0.0–149.0)
Total CHOL/HDL Ratio: 2
VLDL: 12.8 mg/dL (ref 0.0–40.0)

## 2018-03-13 LAB — BASIC METABOLIC PANEL
BUN: 28 mg/dL — ABNORMAL HIGH (ref 6–23)
CALCIUM: 9.9 mg/dL (ref 8.4–10.5)
CO2: 30 mEq/L (ref 19–32)
Chloride: 103 mEq/L (ref 96–112)
Creatinine, Ser: 0.68 mg/dL (ref 0.40–1.20)
GFR: 88.16 mL/min (ref >60.00)
GLUCOSE: 105 mg/dL — AB (ref 70–99)
POTASSIUM: 4.5 meq/L (ref 3.5–5.1)
SODIUM: 140 meq/L (ref 135–145)

## 2018-03-13 MED ORDER — OMEPRAZOLE 10 MG PO CPDR: 10.0000 mg | DELAYED_RELEASE_CAPSULE | Freq: Every day | ORAL | 0 refills | Status: DC

## 2018-03-13 NOTE — Progress Notes (Signed)
Subjective:    Patient ID: Sherry Lamb, female    DOB: [birth year], 83 y.o.   MRN: [id number]  HPI This is an 83 yo female, accompanied by her husband who is also being seen, for follow up of chronic health concerns.   Hyperlipidemia- currently on Crestor 10 mg. Tolerating without side effects.   Barretts esophagus- has been on omeprazole for many years. Was on 40 mg but was decreased to 20 mg. She wonders if she needs it. She denies GERD symptoms currently or prior to being put on omeprazole.   Cough and congestions- for several days, husband with cough. No wheeze, SOB, fever or sputum production. Has been taking a homeopathic cold medication with good relief.   Past Medical History:  Diagnosis Date  . Arthritis   . Frequent headaches   . GERD (gastroesophageal reflux disease)   . Hyperlipidemia    Past Surgical History:  Procedure Laterality Date  . ABDOMINAL HYSTERECTOMY    . EYE SURGERY    . Left hip replacement Left 2013  . right hip replacement Right 2000  . TONSILLECTOMY AND ADENOIDECTOMY  1943   Family History  Problem Relation Age of Onset  . Arthritis Mother   . Asthma Mother   . Hearing loss Mother   . Early death Father   . Heart disease Father   . Hyperlipidemia Father   . Kidney disease Father   . Depression Brother   . Hyperlipidemia Brother    Social History   Tobacco Use  . Smoking status: Never Smoker  . Smokeless tobacco: Never Used  Substance Use Topics  . Alcohol use: Yes  . Drug use: Never      Review of Systems     Objective:   Physical Exam  Constitutional: She is oriented to person, place, and time. She appears well-developed and well-nourished. No distress.  HENT:  Head: Normocephalic and atraumatic.  Right Ear: Tympanic membrane, external ear and ear canal normal.  Left Ear: Tympanic membrane, external ear and ear canal normal.  Nose: Mucosal edema and rhinorrhea present.  Eyes: Conjunctivae are normal.  Neck: Normal range  of motion. Neck supple.  Cardiovascular: Normal rate, regular rhythm and normal heart sounds.  Pulmonary/Chest: Effort normal and breath sounds normal.  Neurological: She is alert and oriented to person, place, and time.  Skin: Skin is warm and dry. She is not diaphoretic.  Psychiatric: She has a normal mood and affect. Her behavior is normal. Judgment and thought content normal.  Vitals reviewed.     BP 128/80 (BP Location: Right Arm, Patient Position: Sitting, Cuff Size: Normal)   Pulse 96   Temp 98.3 F (36.8 C) (Oral)   Ht 5' 3.5" (1.613 m)   Wt 151 lb 6.4 oz (68.7 kg)   SpO2 97%   BMI 26.40 kg/m  Wt Readings from Last 3 Encounters:  03/13/18 151 lb 6.4 oz (68.7 kg)  02/13/18 152 lb (68.9 kg)  10/04/17 147 lb (66.7 kg)       Assessment & Plan:  1. Hyperlipidemia, unspecified hyperlipidemia type - Basic Metabolic Panel - Lipid Panel  2. Viral URI with cough - discussed symptomatic relief, provided RTC precautions  3. Current use of proton pump inhibitor - reviewed outside EGD records which did not show Barrett's esophagus. Discussed slowly tapering omeprazole to avoid rebound acid hypersecretion - omeprazole (PRILOSEC) 10 MG capsule; Take 1 capsule (10 mg total) by mouth daily.  Dispense: 90 capsule; Refill: 0  4. Need  for influenza vaccination - Flu vaccine HIGH DOSE PF  - follow up in 6 months for AWV/ follow up   Clarene Reamer, FNP-BC  [facility] at [facility]  03/15/2018 6:16 AM

## 2018-03-13 NOTE — Patient Instructions (Signed)
Good to see you today  I have sent in a lower dose of omeprazole for your stomach, take daily for 3 weeks then go to every other day for 2 weeks, if no heartburn symptoms, you can stop it

## 2018-03-14 ENCOUNTER — Ambulatory Visit
Admission: RE | Admit: 2018-03-14 | Discharge: 2018-03-14 | Disposition: A | Discharge status: Home / Self Care | Payer: Medicare Other | Source: Ambulatory Visit | Attending: Family Medicine | Admitting: Family Medicine

## 2018-03-14 ENCOUNTER — Other Ambulatory Visit: Payer: Self-pay | Admitting: Family Medicine

## 2018-03-14 DIAGNOSIS — Z1231 Encounter for screening mammogram for malignant neoplasm of breast: Secondary | ICD-10-CM

## 2018-03-14 DIAGNOSIS — E2839 Other primary ovarian failure: Secondary | ICD-10-CM | POA: Diagnosis not present

## 2018-03-14 DIAGNOSIS — M810 Age-related osteoporosis without current pathological fracture: Secondary | ICD-10-CM | POA: Diagnosis not present

## 2018-03-14 DIAGNOSIS — Z780 Asymptomatic menopausal state: Secondary | ICD-10-CM | POA: Diagnosis not present

## 2018-03-15 ENCOUNTER — Encounter: Payer: Self-pay | Admitting: Family Medicine

## 2018-03-20 DIAGNOSIS — Z961 Presence of intraocular lens: Secondary | ICD-10-CM | POA: Diagnosis not present

## 2018-04-03 ENCOUNTER — Ambulatory Visit (INDEPENDENT_AMBULATORY_CARE_PROVIDER_SITE_OTHER): Payer: Medicare Other | Admitting: Family Medicine

## 2018-04-03 ENCOUNTER — Encounter: Payer: Self-pay | Admitting: Family Medicine

## 2018-04-03 VITALS — BP 120/70 | HR 96 | Temp 97.8°F | Ht 63.5 in | Wt 153.0 lb

## 2018-04-03 DIAGNOSIS — M810 Age-related osteoporosis without current pathological fracture: Secondary | ICD-10-CM

## 2018-04-03 LAB — VITAMIN D 25 HYDROXY (VIT D DEFICIENCY, FRACTURES): VITD: 70.18 ng/mL (ref 30.00–100.00)

## 2018-04-03 NOTE — Patient Instructions (Addendum)
Please stop at the lab for blood work  Go to every other day of omeprazole  I will notify you of your vitamin D level and if normal I will send in Fosamax   Alendronate tablets What is this medicine? ALENDRONATE (a LEN droe nate) slows calcium loss from bones. It helps to make normal healthy bone and to slow bone loss in people with Paget's disease and osteoporosis. It may be used in others at risk for bone loss. This medicine may be used for other purposes; ask your health care provider or pharmacist if you have questions. COMMON BRAND NAME(S): Fosamax What should I tell my health care provider before I take this medicine? They need to know if you have any of these conditions: -dental disease -esophagus, stomach, or intestine problems, like acid reflux or GERD -kidney disease -low blood calcium -low vitamin D -problems sitting or standing 30 minutes -trouble swallowing -an unusual or allergic reaction to alendronate, other medicines, foods, dyes, or preservatives -pregnant or trying to get pregnant -breast-feeding How should I use this medicine? You must take this medicine exactly as directed or you will lower the amount of the medicine you absorb into your body or you may cause yourself harm. Take this medicine by mouth first thing in the morning, after you are up for the day. Do not eat or drink anything before you take your medicine. Swallow the tablet with a full glass (6 to 8 fluid ounces) of plain water. Do not take this medicine with any other drink. Do not chew or crush the tablet. After taking this medicine, do not eat breakfast, drink, or take any medicines or vitamins for at least 30 minutes. Sit or stand up for at least 30 minutes after you take this medicine; do not lie down. Do not take your medicine more often than directed. Talk to your pediatrician regarding the use of this medicine in children. Special care may be needed. Overdosage: If you think you have taken too much  of this medicine contact a poison control center or emergency room at once. NOTE: This medicine is only for you. Do not share this medicine with others. What if I miss a dose? If you miss a dose, do not take it later in the day. Continue your normal schedule starting the next morning. Do not take double or extra doses. What may interact with this medicine? -aluminum hydroxide -antacids -aspirin -calcium supplements -drugs for inflammation like ibuprofen, naproxen, and others -iron supplements -magnesium supplements -vitamins with minerals This list may not describe all possible interactions. Give your health care provider a list of all the medicines, herbs, non-prescription drugs, or dietary supplements you use. Also tell them if you smoke, drink alcohol, or use illegal drugs. Some items may interact with your medicine. What should I watch for while using this medicine? Visit your doctor or health care professional for regular checks ups. It may be some time before you see benefit from this medicine. Do not stop taking your medicine except on your doctor's advice. Your doctor or health care professional may order blood tests and other tests to see how you are doing. You should make sure you get enough calcium and vitamin D while you are taking this medicine, unless your doctor tells you not to. Discuss the foods you eat and the vitamins you take with your health care professional. Some people who take this medicine have severe bone, joint, and/or muscle pain. This medicine may also increase your risk for  a broken thigh bone. Tell your doctor right away if you have pain in your upper leg or groin. Tell your doctor if you have any pain that does not go away or that gets worse. This medicine can make you more sensitive to the sun. If you get a rash while taking this medicine, sunlight may cause the rash to get worse. Keep out of the sun. If you cannot avoid being in the sun, wear protective clothing  and use sunscreen. Do not use sun lamps or tanning beds/booths. What side effects may I notice from receiving this medicine? Side effects that you should report to your doctor or health care professional as soon as possible: -allergic reactions like skin rash, itching or hives, swelling of the face, lips, or tongue -black or tarry stools -bone, muscle or joint pain -changes in vision -chest pain -heartburn or stomach pain -jaw pain, especially after dental work -pain or trouble when swallowing -redness, blistering, peeling or loosening of the skin, including inside the mouth Side effects that usually do not require medical attention (report to your doctor or health care professional if they continue or are bothersome): -changes in taste -diarrhea or constipation -eye pain or itching -headache -nausea or vomiting -stomach gas or fullness This list may not describe all possible side effects. Call your doctor for medical advice about side effects. You may report side effects to FDA at 1-800-FDA-1088. Where should I keep my medicine? Keep out of the reach of children. Store at room temperature of 15 and 30 degrees C (59 and 86 degrees F). Throw away any unused medicine after the expiration date. NOTE: This sheet is a summary. It may not cover all possible information. If you have questions about this medicine, talk to your doctor, pharmacist, or health care provider.  2018 Elsevier/Gold Standard (2010-10-21 08:56:09)

## 2018-04-03 NOTE — Progress Notes (Signed)
   Subjective:    Patient ID: Sherry Lamb, female    DOB: 1934/08/15, 83 y.o.   MRN: [id number]  HPI This is an 83 year old female, accompanied by her husband, who presents today to discuss recent results from bone density study.  She had a recent study done on 03/14/2018 which showed a T score of -3.7. I have no records regarding previous tests from prior pcp but patient reports that she was on alendronate for many years, was taken off "because I was on it for too long" and then put on Evista which caused GI upset. She has not been on any medication for osteoporosis for several years. Is concerned about possible side effects from Prolia.  Last EGD negative. Is currently weaning omeprazole, tolerating well.    Past Medical History:  Diagnosis Date  . Arthritis   . Frequent headaches   . GERD (gastroesophageal reflux disease)   . Hyperlipidemia    Past Surgical History:  Procedure Laterality Date  . ABDOMINAL HYSTERECTOMY    . EYE SURGERY    . Left hip replacement Left 2013  . right hip replacement Right 2000  . TONSILLECTOMY AND ADENOIDECTOMY  1943   Family History  Problem Relation Age of Onset  . Arthritis Mother   . Asthma Mother   . Hearing loss Mother   . Early death Father   . Heart disease Father   . Hyperlipidemia Father   . Kidney disease Father   . Depression Brother   . Hyperlipidemia Brother   . Breast cancer Neg Hx    Social History   Tobacco Use  . Smoking status: Never Smoker  . Smokeless tobacco: Never Used  Substance Use Topics  . Alcohol use: Yes  . Drug use: Never      Review of Systems Per HPI    Objective:   Physical Exam Physical Exam  Vitals reviewed. Constitutional: Oriented to person, place, and time. Appears well-developed and well-nourished.  HENT:  Head: Normocephalic and atraumatic.  Eyes: Conjunctivae are normal.  Neck: Normal range of motion. Neck supple.  Cardiovascular: Normal rate.   Pulmonary/Chest: Effort normal.    Musculoskeletal: Normal range of motion.  Neurological: Alert and oriented to person, place, and time.  Psychiatric: Normal mood and affect. Behavior is normal. Judgment and thought content normal.      BP 120/70 (BP Location: Left Arm, Patient Position: Sitting, Cuff Size: Normal)   Pulse 96   Temp 97.8 F (36.6 C) (Oral)   Ht 5' 3.5" (1.613 m)   Wt 153 lb (69.4 kg)   SpO2 97%   BMI 26.68 kg/m  Wt Readings from Last 3 Encounters:  04/03/18 153 lb (69.4 kg)  03/13/18 151 lb 6.4 oz (68.7 kg)  02/13/18 152 lb (68.9 kg)       Assessment & Plan:  1. Age-related osteoporosis without current pathological fracture - Provided written and verbal information regarding diagnosis and treatment. - Discussed treatment options and patient agrees to alendronate. Will check vit d and if normal, will send in prescription. Discussed importance of adequate calcium intake and sources of calcium - VITAMIN D 25 Hydroxy (Vit-D Deficiency, Fractures)   Clarene Reamer, FNP-BC  [facility]  04/03/2018 11:21 AM

## 2018-04-08 MED ORDER — ALENDRONATE SODIUM 70 MG PO TABS: 70.0000 mg | ORAL_TABLET | ORAL | 3 refills | Status: DC

## 2018-04-08 NOTE — Addendum Note (Signed)
Addended by: Clarene Reamer B on: 04/08/2018 07:51 AM   Modules accepted: Orders

## 2018-05-09 ENCOUNTER — Encounter: Payer: Self-pay | Admitting: Podiatry

## 2018-05-09 ENCOUNTER — Ambulatory Visit (INDEPENDENT_AMBULATORY_CARE_PROVIDER_SITE_OTHER): Payer: Medicare Other | Admitting: Podiatry

## 2018-05-09 DIAGNOSIS — B351 Tinea unguium: Secondary | ICD-10-CM | POA: Diagnosis not present

## 2018-05-09 DIAGNOSIS — M79676 Pain in unspecified toe(s): Secondary | ICD-10-CM

## 2018-05-09 DIAGNOSIS — L608 Other nail disorders: Secondary | ICD-10-CM

## 2018-05-09 NOTE — Progress Notes (Signed)
Complaint:  Visit Type: Patient returns to my office for continued preventative foot care services. Complaint: Patient states" my nails have grown long and thick and become painful to walk and wear shoes" Patient has been dealing with ingrown nails right big toe.. The patient presents for preventative foot care services. No changes to ROS  Podiatric Exam: Vascular: dorsalis pedis and posterior tibial pulses are palpable bilateral. Capillary return is immediate. Temperature gradient is WNL. Skin turgor WNL  Sensorium: Normal Semmes Weinstein monofilament test. Normal tactile sensation bilaterally. Nail Exam: Pt has thick disfigured discolored nails with subungual debris noted bilateral entire nail hallux through fifth toenails.  Pincer nail right hallux. Ulcer Exam: There is no evidence of ulcer or pre-ulcerative changes or infection. Orthopedic Exam: Muscle tone and strength are WNL. No limitations in general ROM. No crepitus or effusions noted. Foot type and digits show no abnormalities. Bony prominences are unremarkable. Skin: No Porokeratosis. No infection or ulcers  Diagnosis:  Onychomycosis, , Pain in right toe, pain in left toes  Treatment & Plan Procedures and Treatment: Consent by patient was obtained for treatment procedures.   Debridement of mycotic and hypertrophic toenails, 1 through 5 bilateral and clearing of subungual debris. No ulceration, no infection noted.  Return Visit-Office Procedure: Patient instructed to return to the office for a follow up visit 10 weeks  for continued evaluation and treatment.    Macon Sandiford DPM 

## 2018-05-13 ENCOUNTER — Telehealth: Payer: Self-pay | Admitting: Podiatry

## 2018-05-13 NOTE — Telephone Encounter (Signed)
Pt called following up on compound she had spoke with nurse about.

## 2018-05-14 NOTE — Telephone Encounter (Signed)
Prescription for GALKK cream has been phoned into [facility] and patient has been notified GALKK cream consist of: Gabapentin 2% Amitriptyline 2% Lidocaine 5% Ketoprofen 5%  Ketamine 2.5%

## 2018-05-16 ENCOUNTER — Other Ambulatory Visit: Payer: Self-pay | Admitting: Family Medicine

## 2018-05-16 NOTE — Telephone Encounter (Signed)
Medication not on pts current medication list. Last OV 03/2018

## 2018-05-16 NOTE — Telephone Encounter (Signed)
Received a refill request for her omeprazole. At last visit, we discussed weaning this medication. Please call her and see if she is still taking? If so, clarify dose.

## 2018-05-17 NOTE — Telephone Encounter (Signed)
Advised Sherry Lamb pt is no longer taking medication and she states ok to refuse

## 2018-05-17 NOTE — Telephone Encounter (Signed)
Pt stated she isn't on this medication anymore

## 2018-05-17 NOTE — Telephone Encounter (Signed)
Lm on pts vm requesting a call back to confirm if she is still taking med, and if so, what dose.

## 2018-06-06 DIAGNOSIS — G8929 Other chronic pain: Secondary | ICD-10-CM | POA: Insufficient documentation

## 2018-06-06 DIAGNOSIS — M25562 Pain in left knee: Secondary | ICD-10-CM | POA: Diagnosis not present

## 2018-06-06 DIAGNOSIS — M25561 Pain in right knee: Secondary | ICD-10-CM | POA: Diagnosis not present

## 2018-06-06 DIAGNOSIS — M1711 Unilateral primary osteoarthritis, right knee: Secondary | ICD-10-CM | POA: Diagnosis not present

## 2018-06-06 DIAGNOSIS — M1712 Unilateral primary osteoarthritis, left knee: Secondary | ICD-10-CM | POA: Diagnosis not present

## 2018-06-08 ENCOUNTER — Telehealth: Payer: Self-pay

## 2018-06-10 NOTE — Telephone Encounter (Signed)
Patient Name: Sherry Lamb Gender: Female DOB: August 24, 1934 Age: 84 Y 110 M 10 D Return Phone Number: [phone] (Primary) Address: City/State/Zip: [place] [address] Client [facility] Night - Client Client Site [place] Physician Tor Netters- NP Contact Type Call Who Is Calling Patient / Member / Family / Caregiver Call Type Triage / Clinical Relationship To Patient Self Return Phone Number [phone] (Primary) Chief Complaint Medication reaction Reason for Call Symptomatic / Request for [place] is taking a new Rx her face is red and hot. She had to corterzone shots on the knee on thursday. Translation No Nurse Assessment Nurse: Tressia Danas, RN, [place] Date/Time (Eastern Time): 06/08/2018 10:33:40 AM Confirm and document reason for call. If symptomatic, describe symptoms. ---Caller is taking a new Rx her face is red and hot. She had 2 Cortisone shots on the knee on Thursday. Alendronate 70 mg. no fever. cheeks and forehead Does the patient have any new or worsening symptoms? ---Yes Will a triage be completed? ---Yes Related visit to physician within the last 2 weeks? ---No Does the PT have any chronic conditions? (i.e. diabetes, asthma, this includes High risk factors for pregnancy, etc.) ---Yes List chronic conditions. ---decreased bone density Is this a behavioral health or substance abuse call? ---No Guidelines Guideline Title Affirmed Question Affirmed Notes Nurse Date/Time (Eastern Time) Rash or Redness - Localized Mild localized rash [place], RN, [facility] 06/08/2018 10:39:24 AM Disp. Time Eilene Ghazi Time) Disposition Final User 06/08/2018 10:42:53 AM Cole, RN, [facility] Caller Disagree/Comply Comply Caller Understands Yes PLEASE NOTE: All timestamps contained within this report are represented as [place] Standard Time. CONFIDENTIALTY NOTICE: This fax transmission is  intended only for the addressee. It contains information that is legally privileged, confidential or otherwise protected from use or disclosure. If you are not the intended recipient, you are strictly prohibited from reviewing, disclosing, copying using or disseminating any of this information or taking any action in reliance on or regarding this information. If you have received this fax in error, please notify us immediately by telephone so that we can arrange for its return to [place]. Phone: [phone], Toll-Free: [phone], Fax: [phone] Page: 2 of 2 Call Id: [id number] PreDisposition Did not know what to do Care Advice Given Per Guideline HOME CARE: * You should be able to treat this at home. LOCAL COLD: Apply ice or soak in cold water for 20 minutes every 3 or 4 hours to reduce itching or pain. REASSURANCE AND EDUCATION: New rashes that are in one small (localized) area are usually due to skin contact with an irritating substance. * Rash spreads or becomes worse CALL BACK IF: * Rash lasts over 1 week * You become worse. CARE ADVICE given per Rash - Localized and Cause Unknown (Adult) guideline. EXPECTED COURSE: Most of these rashes pass in 2 to 3 days.

## 2018-06-10 NOTE — Telephone Encounter (Signed)
Attempted to reach patient, unable to leave a message, (answering machine not working) to have her update [place] on her condition.  Will forward to D. Carlean Purl, NP as [place].

## 2018-06-12 NOTE — Telephone Encounter (Signed)
Please call patient and check on her

## 2018-06-17 NOTE — Telephone Encounter (Signed)
Noted  

## 2018-06-17 NOTE — Telephone Encounter (Signed)
Spoke with pt who was confused as to why I was calling. Per pt she states someone named Sherry Lamb has been called her and they figured out the issue which was a new cleanser that she was using and gave her instructions to clear her skin and per pt her skin is back to normal. Apologized to pt and informed her nothing was documented in chart of this but she states she is ok and everything is fine.   [name] this is a Pharmacist, hospital

## 2018-07-18 DIAGNOSIS — M25561 Pain in right knee: Secondary | ICD-10-CM | POA: Diagnosis not present

## 2018-07-18 DIAGNOSIS — M25562 Pain in left knee: Secondary | ICD-10-CM | POA: Diagnosis not present

## 2018-07-25 ENCOUNTER — Ambulatory Visit (INDEPENDENT_AMBULATORY_CARE_PROVIDER_SITE_OTHER): Payer: Medicare Other | Admitting: Podiatry

## 2018-07-25 ENCOUNTER — Encounter: Payer: Self-pay | Admitting: Podiatry

## 2018-07-25 ENCOUNTER — Other Ambulatory Visit: Payer: Self-pay

## 2018-07-25 DIAGNOSIS — B351 Tinea unguium: Secondary | ICD-10-CM | POA: Diagnosis not present

## 2018-07-25 DIAGNOSIS — M79676 Pain in unspecified toe(s): Secondary | ICD-10-CM

## 2018-07-25 DIAGNOSIS — L608 Other nail disorders: Secondary | ICD-10-CM

## 2018-07-25 NOTE — Progress Notes (Signed)
Complaint:  Visit Type: Patient returns to my office for continued preventative foot care services. Complaint: Patient states" my nails have grown long and thick and become painful to walk and wear shoes" Patient has been dealing with ingrown nails right big toe.. The patient presents for preventative foot care services. No changes to ROS  Podiatric Exam: Vascular: dorsalis pedis and posterior tibial pulses are palpable bilateral. Capillary return is immediate. Temperature gradient is WNL. Skin turgor WNL  Sensorium: Normal Semmes Weinstein monofilament test. Normal tactile sensation bilaterally. Nail Exam: Pt has thick disfigured discolored nails with subungual debris noted bilateral entire nail hallux through fifth toenails.  Pincer nail right hallux. Ulcer Exam: There is no evidence of ulcer or pre-ulcerative changes or infection. Orthopedic Exam: Muscle tone and strength are WNL. No limitations in general ROM. No crepitus or effusions noted. Foot type and digits show no abnormalities. Bony prominences are unremarkable. Skin: No Porokeratosis. No infection or ulcers  Diagnosis:  Onychomycosis, , Pain in right toe, pain in left toes  Treatment & Plan Procedures and Treatment: Consent by patient was obtained for treatment procedures.   Debridement of mycotic and hypertrophic toenails, 1 through 5 bilateral and clearing of subungual debris. No ulceration, no infection noted.  Return Visit-Office Procedure: Patient instructed to return to the office for a follow up visit 10 weeks  for continued evaluation and treatment.    Kadeja Granada DPM 

## 2018-08-15 DIAGNOSIS — M170 Bilateral primary osteoarthritis of knee: Secondary | ICD-10-CM | POA: Diagnosis not present

## 2018-08-20 DIAGNOSIS — M545 Low back pain: Secondary | ICD-10-CM | POA: Diagnosis not present

## 2018-08-20 DIAGNOSIS — M5136 Other intervertebral disc degeneration, lumbar region: Secondary | ICD-10-CM | POA: Insufficient documentation

## 2018-08-22 DIAGNOSIS — M170 Bilateral primary osteoarthritis of knee: Secondary | ICD-10-CM | POA: Diagnosis not present

## 2018-08-26 DIAGNOSIS — M1710 Unilateral primary osteoarthritis, unspecified knee: Secondary | ICD-10-CM | POA: Insufficient documentation

## 2018-08-26 DIAGNOSIS — M179 Osteoarthritis of knee, unspecified: Secondary | ICD-10-CM | POA: Insufficient documentation

## 2018-08-29 DIAGNOSIS — M25561 Pain in right knee: Secondary | ICD-10-CM | POA: Diagnosis not present

## 2018-08-29 DIAGNOSIS — M170 Bilateral primary osteoarthritis of knee: Secondary | ICD-10-CM | POA: Diagnosis not present

## 2018-08-29 DIAGNOSIS — M25562 Pain in left knee: Secondary | ICD-10-CM | POA: Diagnosis not present

## 2018-09-04 ENCOUNTER — Ambulatory Visit: Payer: Medicare Other | Admitting: Family Medicine

## 2018-09-04 ENCOUNTER — Telehealth: Payer: Self-pay | Admitting: Family Medicine

## 2018-09-04 NOTE — Telephone Encounter (Signed)
Noted. Patient has follow up on file for next month.

## 2018-09-04 NOTE — Telephone Encounter (Signed)
I spoke with pt and she does not feel like she needs this at this time.  Copied from [place] [id number]. Topic: Quick Communication - Appointment Cancellation >> Sep 03, 2018  4:49 PM Nils Flack wrote: Patient called to cancel appointment scheduled for 09/04/18 Patient has not rescheduled their appointment.  Route to department's PEC pool.

## 2018-09-05 DIAGNOSIS — M5136 Other intervertebral disc degeneration, lumbar region: Secondary | ICD-10-CM | POA: Diagnosis not present

## 2018-09-12 ENCOUNTER — Other Ambulatory Visit: Payer: Self-pay | Admitting: Family Medicine

## 2018-09-12 ENCOUNTER — Encounter: Payer: Self-pay | Admitting: Family Medicine

## 2018-09-12 DIAGNOSIS — E785 Hyperlipidemia, unspecified: Secondary | ICD-10-CM

## 2018-09-12 DIAGNOSIS — R799 Abnormal finding of blood chemistry, unspecified: Secondary | ICD-10-CM

## 2018-09-17 ENCOUNTER — Ambulatory Visit: Payer: Medicare Other

## 2018-09-18 ENCOUNTER — Other Ambulatory Visit: Payer: Medicare Other

## 2018-09-18 ENCOUNTER — Other Ambulatory Visit (INDEPENDENT_AMBULATORY_CARE_PROVIDER_SITE_OTHER): Payer: Medicare Other

## 2018-09-18 ENCOUNTER — Ambulatory Visit (INDEPENDENT_AMBULATORY_CARE_PROVIDER_SITE_OTHER): Payer: Medicare Other

## 2018-09-18 DIAGNOSIS — Z0000 Encounter for general adult medical examination without abnormal findings: Secondary | ICD-10-CM

## 2018-09-18 DIAGNOSIS — E785 Hyperlipidemia, unspecified: Secondary | ICD-10-CM | POA: Diagnosis not present

## 2018-09-18 DIAGNOSIS — R799 Abnormal finding of blood chemistry, unspecified: Secondary | ICD-10-CM

## 2018-09-18 LAB — CBC WITH DIFFERENTIAL/PLATELET
Basophils Absolute: 0 10*3/uL (ref 0.0–0.1)
Basophils Relative: 0.8 % (ref 0.0–3.0)
Eosinophils Absolute: 0 10*3/uL (ref 0.0–0.7)
Eosinophils Relative: 0.9 % (ref 0.0–5.0)
HCT: 41.3 % (ref 36.0–46.0)
Hemoglobin: 14.2 g/dL (ref 12.0–15.0)
Lymphocytes Relative: 32.4 % (ref 12.0–46.0)
Lymphs Abs: 1.7 10*3/uL (ref 0.7–4.0)
MCHC: 34.4 g/dL (ref 30.0–36.0)
MCV: 93.7 fl (ref 78.0–100.0)
Monocytes Absolute: 0.6 10*3/uL (ref 0.1–1.0)
Monocytes Relative: 11.9 % (ref 3.0–12.0)
Neutro Abs: 2.8 10*3/uL (ref 1.4–7.7)
Neutrophils Relative %: 54 % (ref 43.0–77.0)
Platelets: 211 10*3/uL (ref 150.0–400.0)
RBC: 4.4 Mil/uL (ref 3.87–5.11)
RDW: 13.9 % (ref 11.5–15.5)
WBC: 5.2 10*3/uL (ref 4.0–10.5)

## 2018-09-18 LAB — COMPREHENSIVE METABOLIC PANEL
ALT: 12 U/L (ref 0–35)
AST: 20 U/L (ref 0–37)
Albumin: 3.7 g/dL (ref 3.5–5.2)
Alkaline Phosphatase: 53 U/L (ref 39–117)
BUN: 24 mg/dL — ABNORMAL HIGH (ref 6–23)
CO2: 30 mEq/L (ref 19–32)
Calcium: 9 mg/dL (ref 8.4–10.5)
Chloride: 105 mEq/L (ref 96–112)
Creatinine, Ser: 0.52 mg/dL (ref 0.40–1.20)
GFR: 112.9 mL/min (ref >60.00)
Glucose, Bld: 97 mg/dL (ref 70–99)
Potassium: 4 mEq/L (ref 3.5–5.1)
Sodium: 141 mEq/L (ref 135–145)
Total Bilirubin: 0.6 mg/dL (ref 0.2–1.2)
Total Protein: 6.6 g/dL (ref 6.0–8.3)

## 2018-09-18 NOTE — Progress Notes (Signed)
PCP notes:   Health maintenance:  No gaps identified.  Abnormal screenings:   Depression score: 2 Depression screen [facility] 2/9 09/18/2018 09/10/2017  Decreased Interest 1 0  Down, Depressed, Hopeless 1 0  PHQ - 2 Score 2 0  Altered sleeping 0 -  Tired, decreased energy 0 -  Change in appetite 0 -  Feeling bad or failure about yourself  0 -  Trouble concentrating 0 -  Moving slowly or fidgety/restless 0 -  Suicidal thoughts 0 -  PHQ-9 Score 2 -  Difficult doing work/chores Not difficult at all -   Patient concerns:   None  Nurse concerns:  None  Next PCP appt:   10/04/18 @ 0930

## 2018-09-18 NOTE — Progress Notes (Signed)
Subjective:   Sherry Lamb is a 84 y.o. female who presents for an Initial Medicare Annual Wellness Visit.  Review of Systems    N/A  Cardiac Risk Factors include: advanced age (>56men, >38 women);dyslipidemia     Objective:    Today's Vitals   09/18/18 1716  PainSc: 0-No pain   There is no height or weight on file to calculate BMI.  Advanced Directives 09/18/2018  Does Patient Have a Medical Advance Directive? Yes  Type of Paramedic of [place];Living will  Copy of [place] in Chart? No - copy requested    Current Medications (verified) Outpatient Encounter Medications as of 09/18/2018  Medication Sig  . alendronate (FOSAMAX) 70 MG tablet Take 1 tablet (70 mg total) by mouth every 7 (seven) days. Take with a full glass of water on an empty stomach.  Marland Kitchen aspirin EC 81 MG tablet Take 81 mg by mouth daily.  . Biotin 10 MG TABS Take 5,000 mg by mouth daily.   . Cholecalciferol (HM VITAMIN D3) 4000 units CAPS Take 4,000 Int'l Units by mouth daily.  . rosuvastatin (CRESTOR) 10 MG tablet TAKE 1 TABLET DAILY   No facility-administered encounter medications on file as of 09/18/2018.     Allergies (verified) Penicillins   History: Past Medical History:  Diagnosis Date  . Arthritis   . Frequent headaches   . GERD (gastroesophageal reflux disease)   . Hyperlipidemia    Past Surgical History:  Procedure Laterality Date  . ABDOMINAL HYSTERECTOMY    . EYE SURGERY    . Left hip replacement Left 2013  . right hip replacement Right 2000  . TONSILLECTOMY AND ADENOIDECTOMY  1943   Family History  Problem Relation Age of Onset  . Arthritis Mother   . Asthma Mother   . Hearing loss Mother   . Early death Father   . Heart disease Father   . Hyperlipidemia Father   . Kidney disease Father   . Depression Brother   . Hyperlipidemia Brother   . Breast cancer Neg Hx    Social History   Socioeconomic History  . Marital status:  Married    Spouse name: Not on file  . Number of children: Not on file  . Years of education: Not on file  . Highest education level: Not on file  Occupational History  . Not on file  Social Needs  . Financial resource strain: Not on file  . Food insecurity:    Worry: Not on file    Inability: Not on file  . Transportation needs:    Medical: Not on file    Non-medical: Not on file  Tobacco Use  . Smoking status: Never Smoker  . Smokeless tobacco: Never Used  Substance and Sexual Activity  . Alcohol use: Yes  . Drug use: Never  . Sexual activity: Yes    Partners: Male  Lifestyle  . Physical activity:    Days per week: Not on file    Minutes per session: Not on file  . Stress: Not on file  Relationships  . Social connections:    Talks on phone: Not on file    Gets together: Not on file    Attends religious service: Not on file    Active member of club or organization: Not on file    Attends meetings of clubs or organizations: Not on file    Relationship status: Not on file  Other Topics Concern  . Not  on file  Social History Narrative   Married to [employer] to [facility] from [facility] 2019 to be closer to family    Tobacco Counseling Counseling given: No   Clinical Intake:  Pre-visit preparation completed: Yes  Pain : No/denies pain Pain Score: 0-No pain     Nutritional Status: BMI 25 -29 Overweight Nutritional Risks: None Diabetes: No  How often do you need to have someone help you when you read instructions, pamphlets, or other written materials from your doctor or pharmacy?: 1 - Never What is the last grade level you completed in school?: 12th grade + 1 yr college  Interpreter Needed?: No  Comments: pt lives with spouse Information entered by :: LPinson, LPN   Activities of Daily Living In your present state of health, do you have any difficulty performing the following activities: 09/18/2018  Hearing? N  Vision? N  Difficulty concentrating or making  decisions? N  Walking or climbing stairs? N  Dressing or bathing? N  Doing errands, shopping? N  Preparing Food and eating ? N  Using the Toilet? N  In the past six months, have you accidently leaked urine? N  Do you have problems with loss of bowel control? N  Managing your Medications? N  Managing your Finances? N  Housekeeping or managing your Housekeeping? N  Some recent data might be hidden     Immunizations and Health Maintenance Immunization History  Administered Date(s) Administered  . Influenza, High Dose Seasonal PF 03/13/2018  . Zoster Recombinat (Shingrix) 07/02/2018   There are no preventive care reminders to display for this patient.  Patient Care Team: Elby Beck, FNP as PCP - General (Nurse Practitioner)  Indicate any recent Medical Services you may have received from other than [facility] providers in the past year (date may be approximate).     Assessment:   This is a routine wellness examination for [facility].  Hearing/Vision screen Vision Screening Comments: Vision exam in Dec 2019 @ [facility]  Dietary issues and exercise activities discussed: Current Exercise Habits: The patient does not participate in regular exercise at present, Exercise limited by: None identified  Goals    . Patient Stated     Starting 09/18/2018, I will continue to take medications as prescribed.       Depression Screen PHQ 2/9 Scores 09/18/2018 09/10/2017  PHQ - 2 Score 2 0  PHQ- 9 Score 2 -    Fall Risk Fall Risk  09/18/2018 09/10/2017  Falls in the past year? 0 No   Cognitive Function: MMSE - Mini Mental State Exam 09/18/2018  Orientation to time 5  Orientation to Place 5  Registration 3  Attention/ Calculation 0  Recall 3  Language- name 2 objects 0  Language- repeat 1  Language- follow 3 step command 0  Language- read & follow direction 0  Write a sentence 0  Copy design 0  Total score 17     PLEASE NOTE: A Mini-Cog screen was completed. Maximum  score is 17. A value of 0 denotes this part of Folstein MMSE was not completed or the patient failed this part of the Mini-Cog screening.   Mini-Cog Screening Orientation to Time - Max 5 pts Orientation to Place - Max 5 pts Registration - Max 3 pts Recall - Max 3 pts Language Repeat - Max 1 pts      Screening Tests Health Maintenance  Topic Date Due  . PNA vac Low Risk Adult (1 of 2 -  PCV13) 05/08/2019 (Originally 09/26/2001)  . TETANUS/TDAP  05/07/2020 (Originally 09/27/1953)  . INFLUENZA VACCINE  12/07/2018  . DEXA SCAN  Completed     Plan:     I have personally reviewed, addressed, and noted the following in the patient's chart:  A. Medical and social history B. Use of alcohol, tobacco or illicit drugs  C. Current medications and supplements D. Functional ability and status E.  Nutritional status F.  Physical activity G. Advance directives H. List of other physicians I.  Hospitalizations, surgeries, and ER visits in previous 12 months J.  Vitals (unless it is a telemedicine encounter) K. Screenings to include cognitive, depression, hearing, vision (NOTE: hearing and vision screenings not completed in telemedicine encounter) L. Referrals and appointments   In addition, I have reviewed and discussed with patient certain preventive protocols, quality metrics, and best practice recommendations. A written personalized care plan for preventive services and recommendations were provided to patient.  With patient's permission, we connected on 09/18/18 at  1:30 PM EDT by a video enabled telemedicine application. Two patient identifiers were used to ensure the encounter occurred with the correct person.    Patient was in home and writer was in office.   Signed,   Lindell Noe, MHA, BS, LPN Health Coach

## 2018-09-18 NOTE — Patient Instructions (Signed)
Sherry Lamb , Thank you for taking time to come for your Medicare Wellness Visit. I appreciate your ongoing commitment to your health goals. Please review the following plan we discussed and let me know if I can assist you in the future.   These are the goals we discussed: Goals    . Patient Stated     Starting 09/18/2018, I will continue to take medications as prescribed.        This is a list of the screening recommended for you and due dates:  Health Maintenance  Topic Date Due  . Pneumonia vaccines (1 of 2 - PCV13) 05/08/2019*  . Tetanus Vaccine  05/07/2020*  . Flu Shot  12/07/2018  . DEXA scan (bone density measurement)  Completed  *Topic was postponed. The date shown is not the original due date.   Preventive Care for Adults  A healthy lifestyle and preventive care can promote health and wellness. Preventive health guidelines for adults include the following key practices.  . A routine yearly physical is a good way to check with your health care provider about your health and preventive screening. It is a chance to share any concerns and updates on your health and to receive a thorough exam.  . Visit your dentist for a routine exam and preventive care every 6 months. Brush your teeth twice a day and floss once a day. Good oral hygiene prevents tooth decay and gum disease.  . The frequency of eye exams is based on your age, health, family medical history, use  of contact lenses, and other factors. Follow your health care provider's recommendations for frequency of eye exams.  . Eat a healthy diet. Foods like vegetables, fruits, whole grains, low-fat dairy products, and lean protein foods contain the nutrients you need without too many calories. Decrease your intake of foods high in solid fats, added sugars, and salt. Eat the right amount of calories for you. Get information about a proper diet from your health care provider, if necessary.  . Regular physical exercise is one of the  most important things you can do for your health. Most adults should get at least 150 minutes of moderate-intensity exercise (any activity that increases your heart rate and causes you to sweat) each week. In addition, most adults need muscle-strengthening exercises on 2 or more days a week.  Silver Sneakers may be a benefit available to you. To determine eligibility, you may visit the website: www.silversneakers.com or contact program at (214)387-3826 Mon-Fri between 8AM-8PM.   . Maintain a healthy weight. The body mass index (BMI) is a screening tool to identify possible weight problems. It provides an estimate of body fat based on height and weight. Your health care provider can find your BMI and can help you achieve or maintain a healthy weight.   For adults 20 years and older: ? A BMI below 18.5 is considered underweight. ? A BMI of 18.5 to 24.9 is normal. ? A BMI of 25 to 29.9 is considered overweight. ? A BMI of 30 and above is considered obese.   . Maintain normal blood lipids and cholesterol levels by exercising and minimizing your intake of saturated fat. Eat a balanced diet with plenty of fruit and vegetables. Blood tests for lipids and cholesterol should begin at age 1 and be repeated every 5 years. If your lipid or cholesterol levels are high, you are over 50, or you are at high risk for heart disease, you may need your cholesterol levels checked  more frequently. Ongoing high lipid and cholesterol levels should be treated with medicines if diet and exercise are not working.  . If you smoke, find out from your health care provider how to quit. If you do not use tobacco, please do not start.  . If you choose to drink alcohol, please do not consume more than 2 drinks per day. One drink is considered to be 12 ounces (355 mL) of beer, 5 ounces (148 mL) of wine, or 1.5 ounces (44 mL) of liquor.  . If you are 7-23 years old, ask your health care provider if you should take aspirin to  prevent strokes.  . Use sunscreen. Apply sunscreen liberally and repeatedly throughout the day. You should seek shade when your shadow is shorter than you. Protect yourself by wearing long sleeves, pants, a wide-brimmed hat, and sunglasses year round, whenever you are outdoors.  . Once a month, do a whole body skin exam, using a mirror to look at the skin on your back. Tell your health care provider of new moles, moles that have irregular borders, moles that are larger than a pencil eraser, or moles that have changed in shape or color.

## 2018-09-23 NOTE — Progress Notes (Signed)
I reviewed health advisor's note, was available for consultation, and agree with documentation and plan.  

## 2018-09-27 DIAGNOSIS — M5416 Radiculopathy, lumbar region: Secondary | ICD-10-CM | POA: Insufficient documentation

## 2018-10-03 ENCOUNTER — Ambulatory Visit: Payer: Medicare Other | Admitting: Podiatry

## 2018-10-04 ENCOUNTER — Ambulatory Visit (INDEPENDENT_AMBULATORY_CARE_PROVIDER_SITE_OTHER): Payer: Medicare Other | Admitting: Family Medicine

## 2018-10-04 ENCOUNTER — Encounter: Payer: Self-pay | Admitting: Family Medicine

## 2018-10-04 DIAGNOSIS — M25561 Pain in right knee: Secondary | ICD-10-CM

## 2018-10-04 DIAGNOSIS — K219 Gastro-esophageal reflux disease without esophagitis: Secondary | ICD-10-CM | POA: Diagnosis not present

## 2018-10-04 DIAGNOSIS — M810 Age-related osteoporosis without current pathological fracture: Secondary | ICD-10-CM | POA: Diagnosis not present

## 2018-10-04 DIAGNOSIS — E785 Hyperlipidemia, unspecified: Secondary | ICD-10-CM | POA: Diagnosis not present

## 2018-10-04 DIAGNOSIS — G8929 Other chronic pain: Secondary | ICD-10-CM | POA: Diagnosis not present

## 2018-10-04 DIAGNOSIS — M25562 Pain in left knee: Secondary | ICD-10-CM

## 2018-10-04 MED ORDER — ROSUVASTATIN CALCIUM 10 MG PO TABS: 10.0000 mg | ORAL_TABLET | Freq: Every day | ORAL | 1 refills | Status: DC

## 2018-10-04 NOTE — Patient Instructions (Addendum)
Hi Sherry Lamb,  It was good to talk with you for your virtual visit. I'm glad you are hanging in there!  Your labs looked good, water intake improved, continue to work on this, especially as the weather gets warmer!  Some supplements for inflammation and joint pain that some people find helpful- Tumeric with black pepper, glucosamine/chondroitin, tart cherry.   Also, work to increase stretching and walking as much as possible. I really like Yoga with Adrienne on YouTube. She has gentle and chair yoga.   Your cholesterol is perfect on the Crestor and we can talk about decreasing your dose with you next lab results.   Please follow up in 6 months. Hopefully, things will be closer to normal by then!   Warm regards,  Tor Netters, FNP-BC

## 2018-10-04 NOTE — Progress Notes (Addendum)
Virtual Visit via Video Note  I connected with Sherry Lamb on 10/04/18 at  9:30 AM EDT by a video enabled telemedicine application and verified that I am speaking with the correct person using two identifiers.  Location: Patient: In her home Provider: In my home   I discussed the limitations of evaluation and management by telemedicine and the availability of in person appointments. The patient expressed understanding and agreed to proceed.  History of Present Illness: This is an 84 yo female who presents today for video visit for follow up of chronic medical conditions. She had her Medicare AWV 09/18/2018. Has been doing ok. Staying at home. Daughter doing her shopping. Walks outdoor some.   We were initially able to connect via video but lost connection and completed visit via telephone.   Hyperlipidemia- last lipid panel 03/13/2018 with good control on rosuvastatin 10 mg.  LDL 70, ratio 2.   GERD with chronic PPI use- was able to successfully wean off omeprazole. No GERD symptoms.   Osteoporosis on Fosamax- no side effects of medication.   Knee pain/back pain- chronic. Had series of 3 knee injections as well as back injection April 2020. No improvement. Little exercise. Takes 1 Alleve twice a day as needed. Does not take daily. Interested in turmeric for inflammation.   Past Medical History:  Diagnosis Date  . Arthritis   . Frequent headaches   . GERD (gastroesophageal reflux disease)   . Hyperlipidemia    Past Surgical History:  Procedure Laterality Date  . ABDOMINAL HYSTERECTOMY    . EYE SURGERY    . Left hip replacement Left 2013  . right hip replacement Right 2000  . TONSILLECTOMY AND ADENOIDECTOMY  1943   Family History  Problem Relation Age of Onset  . Arthritis Mother   . Asthma Mother   . Hearing loss Mother   . Early death Father   . Heart disease Father   . Hyperlipidemia Father   . Kidney disease Father   . Depression Brother   . Hyperlipidemia Brother    . Breast cancer Neg Hx    Social History   Tobacco Use  . Smoking status: Never Smoker  . Smokeless tobacco: Never Used  Substance Use Topics  . Alcohol use: Yes  . Drug use: Never      Observations/Objective: The patient is alert and answers questions appropriately. She is in no distress. Visible skin is unremarkable. She is normally conversive without shortness of breath. Mood and affect are appropriate.   There were no vitals taken for this visit. BP Readings from Last 3 Encounters:  04/03/18 120/70  03/13/18 128/80  02/13/18 116/70   Wt Readings from Last 3 Encounters:  04/03/18 153 lb (69.4 kg)  03/13/18 151 lb 6.4 oz (68.7 kg)  02/13/18 152 lb (68.9 kg)   Depression screen [facility] 2/9 09/18/2018 09/10/2017  Decreased Interest 1 0  Down, Depressed, Hopeless 1 0  PHQ - 2 Score 2 0  Altered sleeping 0 -  Tired, decreased energy 0 -  Change in appetite 0 -  Feeling bad or failure about yourself  0 -  Trouble concentrating 0 -  Moving slowly or fidgety/restless 0 -  Suicidal thoughts 0 -  PHQ-9 Score 2 -  Difficult doing work/chores Not difficult at all -    Assessment and Plan: 1. Hyperlipidemia, unspecified hyperlipidemia type - well controlled on rosuvastatin 10 mg - advised to stop niacin due to potential interaction and increased side effects with rosuvastatin -  follow up labs in 6 months  2. Gastroesophageal reflux disease, esophagitis presence not specified - doing well off omeprazole  3. Age-related osteoporosis without current pathological fracture - tolerating aldonerate, continue  4. Bilateral chronic knee pain - discussed sparingly using Alleve, increasing stretching, exercise as tolerated   Clarene Reamer, FNP-BC  [facility] [place]  10/04/2018 9:58 AM   Follow Up Instructions: AVS mailed to patient.    I discussed the assessment and treatment plan with the patient. The patient was provided an  opportunity to ask questions and all were answered. The patient agreed with the plan and demonstrated an understanding of the instructions.   The patient was advised to call back or seek an in-person evaluation if the symptoms worsen or if the condition fails to improve as anticipated.  Elby Beck, FNP

## 2018-10-07 ENCOUNTER — Other Ambulatory Visit: Payer: Self-pay

## 2018-10-07 ENCOUNTER — Ambulatory Visit (INDEPENDENT_AMBULATORY_CARE_PROVIDER_SITE_OTHER): Payer: Medicare Other | Admitting: Podiatry

## 2018-10-07 ENCOUNTER — Encounter: Payer: Self-pay | Admitting: Podiatry

## 2018-10-07 VITALS — Temp 97.3°F

## 2018-10-07 DIAGNOSIS — L608 Other nail disorders: Secondary | ICD-10-CM

## 2018-10-07 DIAGNOSIS — B351 Tinea unguium: Secondary | ICD-10-CM

## 2018-10-07 DIAGNOSIS — M79676 Pain in unspecified toe(s): Secondary | ICD-10-CM | POA: Diagnosis not present

## 2018-10-07 NOTE — Progress Notes (Signed)
Complaint:  Visit Type: Patient returns to my office for continued preventative foot care services. Complaint: Patient states" my nails have grown long and thick and become painful to walk and wear shoes" Patient has been dealing with ingrown nails right big toe.. The patient presents for preventative foot care services. No changes to ROS  Podiatric Exam: Vascular: dorsalis pedis and posterior tibial pulses are palpable bilateral. Capillary return is immediate. Temperature gradient is WNL. Skin turgor WNL  Sensorium: Normal Semmes Weinstein monofilament test. Normal tactile sensation bilaterally. Nail Exam: Pt has thick disfigured discolored nails with subungual debris noted bilateral entire nail hallux through fifth toenails.  Pincer nail right hallux. Ulcer Exam: There is no evidence of ulcer or pre-ulcerative changes or infection. Orthopedic Exam: Muscle tone and strength are WNL. No limitations in general ROM. No crepitus or effusions noted. Foot type and digits show no abnormalities. Bony prominences are unremarkable. Skin: No Porokeratosis. No infection or ulcers  Diagnosis:  Onychomycosis, , Pain in right toe, pain in left toes  Treatment & Plan Procedures and Treatment: Consent by patient was obtained for treatment procedures.   Debridement of mycotic and hypertrophic toenails, 1 through 5 bilateral and clearing of subungual debris. No ulceration, no infection noted.  Return Visit-Office Procedure: Patient instructed to return to the office for a follow up visit 10 weeks  for continued evaluation and treatment.    Gardiner Barefoot DPM

## 2018-10-08 ENCOUNTER — Telehealth: Payer: Self-pay

## 2018-10-08 NOTE — Telephone Encounter (Signed)
Patient has the following questions for D. Carlean Purl, NP:  1.  She currently is taking Vitamin D 3 4000iu daily and wonders if that is an appropriate dosing.  She doesn't want to take too much.  Please advise.   2.  She had her first shingles shot in February but the location she received does not have supply and is not administering right now.  She wants to know what she can do?  Will she just get the 2nd one later or will it not be any good beyond the 6 month period.   I have informed patient of the following:  As I am the shingrix nurse here at [facility], I can administer her 2nd dose, but do have a limited supply and a waiting list to cycle through.  We have until August to get her 2nd dose.  I will add her to my list with the notation that she must receive by 12/31/18 to keep on schedule with her second dose. *She was made aware that insurance will not cover for her to receive here as it only covers under the pharmacy benefit.  But, if it means the difference of her not getting her 2nd necessary dose, she is willing to pay out of pocket and knows cost could run somewhere between 200-260 dollars for in office administration.   Of course, if the prior administration location should begin administering Shingrix before then and she goes ahead and gets it, please let me know.

## 2018-10-09 NOTE — Telephone Encounter (Signed)
Ms. Ringold notified as instructed by telephone.  Patient states understanding.

## 2018-10-09 NOTE — Telephone Encounter (Signed)
Please call patient and let her know that I recommend vitamin D3 1,000 IU daily. She can use up her current supply, just take twice a week until gone.  Also, she can call other pharmacies to see if they have Shingrix. She can try [facility] in [place] on [address]

## 2018-10-16 ENCOUNTER — Telehealth: Payer: Self-pay

## 2018-10-16 NOTE — Telephone Encounter (Signed)
Pt left v/m; pt had FU visit on 10/04/18.pt was advised to take tumeric with pepper and instructions on bottle are to take 3 caps once or twice daily. Pt wants to take less and wants to know if can take 2 caps once daily. Also pt wants to know if can take Vit D 3 - 2000 every other night until they run out and then will get Vit D 3 1000 to take daily; is that OK. Also when pt gets cb she would like to review the lab results or copy of recent labs.

## 2018-10-16 NOTE — Telephone Encounter (Signed)
It is fine to take less of the tumeric supplement and use current vitamin D dose, taking every other day. Please mail her a copy of her labs.

## 2018-10-17 NOTE — Telephone Encounter (Signed)
Patient advised and lab results mailed.

## 2018-11-06 ENCOUNTER — Telehealth: Payer: Self-pay | Admitting: Family Medicine

## 2018-11-06 NOTE — Telephone Encounter (Signed)
Please call Sherry Lamb to set up shingrix vaccine from wait list. Best dates are 7/14, 7/21, 7/28, 8/4, 8/11. Slots are 1130, 1145, 1200, 230, 245, 300, 315, 330.  Sherry Lamb had ist dose @pharmacy . See if 2nd is needed

## 2018-11-19 NOTE — Telephone Encounter (Signed)
I spoke with patient, she has not received the 2nd dose at this time. She is on the [facility] list and will call them back to see what is happening there. She will call back if needed here. 1st dose 07/02/18

## 2018-11-19 NOTE — Telephone Encounter (Signed)
Patient stated she reached out to [facility] and she received her 2nd dose today .  11/19/2018

## 2018-11-20 NOTE — Telephone Encounter (Signed)
Updated chart.

## 2018-12-16 ENCOUNTER — Ambulatory Visit (INDEPENDENT_AMBULATORY_CARE_PROVIDER_SITE_OTHER): Payer: Medicare Other | Admitting: Podiatry

## 2018-12-16 ENCOUNTER — Other Ambulatory Visit: Payer: Self-pay

## 2018-12-16 ENCOUNTER — Encounter: Payer: Self-pay | Admitting: Podiatry

## 2018-12-16 VITALS — Temp 97.1°F

## 2018-12-16 DIAGNOSIS — B351 Tinea unguium: Secondary | ICD-10-CM | POA: Diagnosis not present

## 2018-12-16 DIAGNOSIS — L608 Other nail disorders: Secondary | ICD-10-CM

## 2018-12-16 DIAGNOSIS — M79676 Pain in unspecified toe(s): Secondary | ICD-10-CM | POA: Diagnosis not present

## 2018-12-16 NOTE — Progress Notes (Signed)
Complaint:  Visit Type: Patient returns to my office for continued preventative foot care services. Complaint: Patient states" my nails have grown long and thick and become painful to walk and wear shoes" Patient has been dealing with ingrown nails right big toe.. The patient presents for preventative foot care services. No changes to ROS  Podiatric Exam: Vascular: dorsalis pedis and posterior tibial pulses are palpable bilateral. Capillary return is immediate. Temperature gradient is WNL. Skin turgor WNL  Sensorium: Normal Semmes Weinstein monofilament test. Normal tactile sensation bilaterally. Nail Exam: Pt has thick disfigured discolored nails with subungual debris noted bilateral entire nail hallux through fifth toenails.  Pincer nails  B/L. Ulcer Exam: There is no evidence of ulcer or pre-ulcerative changes or infection. Orthopedic Exam: Muscle tone and strength are WNL. No limitations in general ROM. No crepitus or effusions noted. Foot type and digits show no abnormalities. Bony prominences are unremarkable. Skin: No Porokeratosis. No infection or ulcers  Diagnosis:  Onychomycosis, , Pain in right toe, pain in left toes  Treatment & Plan Procedures and Treatment: Consent by patient was obtained for treatment procedures.   Debridement of mycotic and hypertrophic toenails, 1 through 5 bilateral and clearing of subungual debris. No ulceration, no infection noted.  Return Visit-Office Procedure: Patient instructed to return to the office for a follow up visit 10 weeks  for continued evaluation and treatment.    Gardiner Barefoot DPM

## 2019-01-03 ENCOUNTER — Telehealth: Payer: Self-pay | Admitting: Family Medicine

## 2019-01-03 NOTE — Telephone Encounter (Signed)
Please call patient tell her that a notification that her rosuvastatin will no longer be covered by her health insurance.  This is worked well for her in the past and I would like her to stay on it.  It is a generic medication and she should be able to get it either from her current pharmacy or from [facility] very inexpensively. She would just need to ask the pharmacist to give her the non insurance or out of pocket price. If it is unaffordable, please tell her to let me know and we will find an alternative.

## 2019-01-03 NOTE — Telephone Encounter (Signed)
Spoke with pt relaying Debbie's message.  Pt verbalizes understanding.  Says she will call insurance to see what alternative would be covered.

## 2019-01-06 NOTE — Telephone Encounter (Signed)
Spoke with patient, states that she spoke with insurance and they stated that they do no see where there is an issue with her Crestor Rx coverage. Pt states that that pharmacy must have gotten her confused with someone else.   Pt will call if any issues getting refilled.   Nothing further needed.

## 2019-02-07 ENCOUNTER — Other Ambulatory Visit: Payer: Self-pay | Admitting: Family Medicine

## 2019-02-07 DIAGNOSIS — Z1231 Encounter for screening mammogram for malignant neoplasm of breast: Secondary | ICD-10-CM

## 2019-02-11 DIAGNOSIS — Z23 Encounter for immunization: Secondary | ICD-10-CM | POA: Diagnosis not present

## 2019-02-25 ENCOUNTER — Other Ambulatory Visit: Payer: Self-pay | Admitting: Family Medicine

## 2019-02-25 DIAGNOSIS — M810 Age-related osteoporosis without current pathological fracture: Secondary | ICD-10-CM

## 2019-02-27 ENCOUNTER — Encounter: Payer: Self-pay | Admitting: Podiatry

## 2019-02-27 ENCOUNTER — Ambulatory Visit (INDEPENDENT_AMBULATORY_CARE_PROVIDER_SITE_OTHER): Payer: Medicare Other | Admitting: Podiatry

## 2019-02-27 ENCOUNTER — Other Ambulatory Visit: Payer: Self-pay

## 2019-02-27 DIAGNOSIS — L608 Other nail disorders: Secondary | ICD-10-CM

## 2019-02-27 DIAGNOSIS — M79676 Pain in unspecified toe(s): Secondary | ICD-10-CM

## 2019-02-27 DIAGNOSIS — B351 Tinea unguium: Secondary | ICD-10-CM

## 2019-02-27 NOTE — Progress Notes (Signed)
Complaint:  Visit Type: Patient returns to my office for continued preventative foot care services. Complaint: Patient states" my nails have grown long and thick and become painful to walk and wear shoes" Patient has been dealing with ingrown nails right big toe.. The patient presents for preventative foot care services. No changes to ROS  Podiatric Exam: Vascular: dorsalis pedis and posterior tibial pulses are palpable bilateral. Capillary return is immediate. Temperature gradient is WNL. Skin turgor WNL  Sensorium: Normal Semmes Weinstein monofilament test. Normal tactile sensation bilaterally. Nail Exam: Pt has thick disfigured discolored nails with subungual debris noted bilateral entire nail hallux through fifth toenails.  Pincer nails  B/L. Ulcer Exam: There is no evidence of ulcer or pre-ulcerative changes or infection. Orthopedic Exam: Muscle tone and strength are WNL. No limitations in general ROM. No crepitus or effusions noted. Foot type and digits show no abnormalities. Bony prominences are unremarkable. Skin: No Porokeratosis. No infection or ulcers  Diagnosis:  Onychomycosis, , Pain in right toe, pain in left toes  Treatment & Plan Procedures and Treatment: Consent by patient was obtained for treatment procedures.   Debridement of mycotic and hypertrophic toenails, 1 through 5 bilateral and clearing of subungual debris. No ulceration, no infection noted.  Return Visit-Office Procedure: Patient instructed to return to the office for a follow up visit 10 weeks  for continued evaluation and treatment.    Glennie Bose DPM 

## 2019-03-19 ENCOUNTER — Ambulatory Visit
Admission: RE | Admit: 2019-03-19 | Discharge: 2019-03-19 | Disposition: A | Discharge status: Home / Self Care | Payer: Medicare Other | Source: Ambulatory Visit | Attending: Family Medicine | Admitting: Family Medicine

## 2019-03-19 DIAGNOSIS — Z1231 Encounter for screening mammogram for malignant neoplasm of breast: Secondary | ICD-10-CM | POA: Diagnosis not present

## 2019-03-19 IMAGING — MG DIGITAL SCREENING BILAT W/ TOMO W/ CAD
6 of 10 series · 6 of 30 positions shown · non-contrast
Comparison: Previous exam(s).

CLINICAL DATA: Screening.

EXAM:
DIGITAL SCREENING BILATERAL MAMMOGRAM WITH TOMO AND CAD

[R MLO synth-2D (1 of 2)]
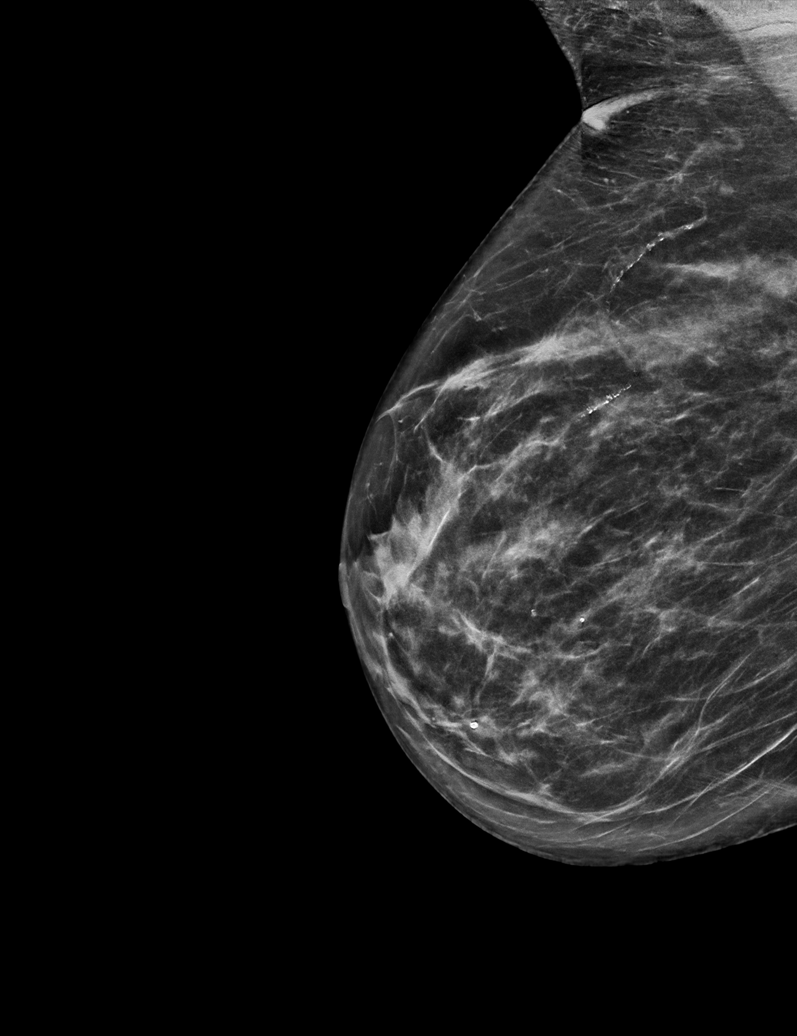

[L CC synth-2D]
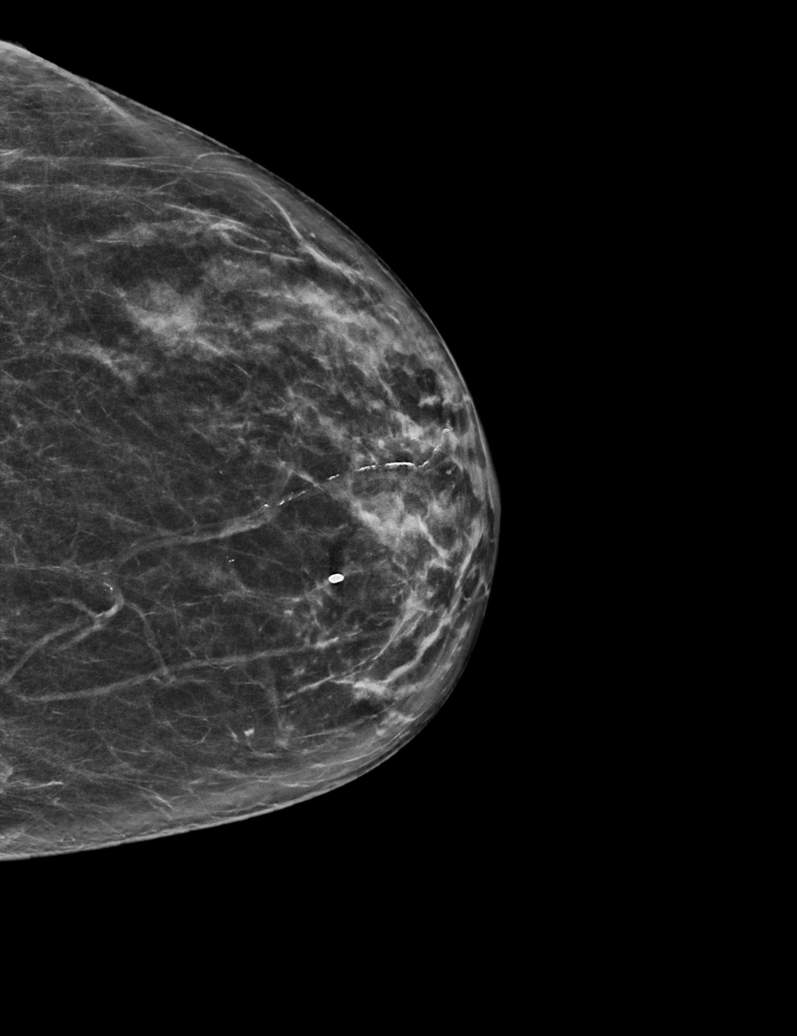

[R MLO synth-2D (2 of 2)]
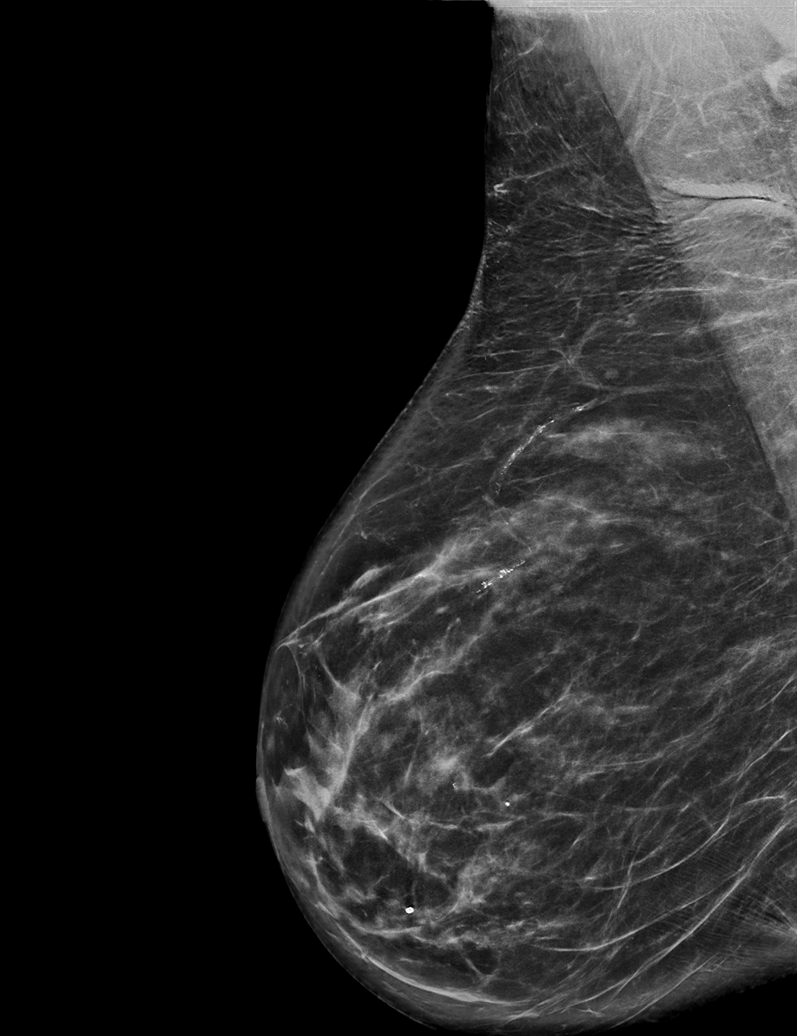

[R CC synth-2D]
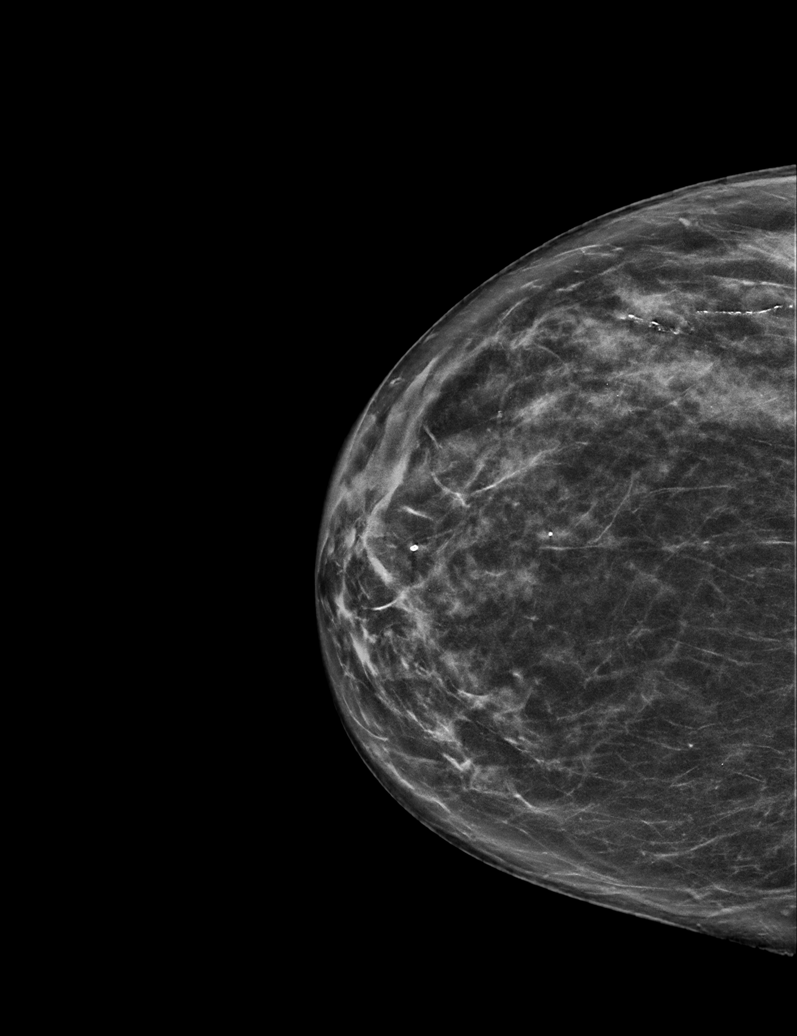

[L MLO synth-2D]
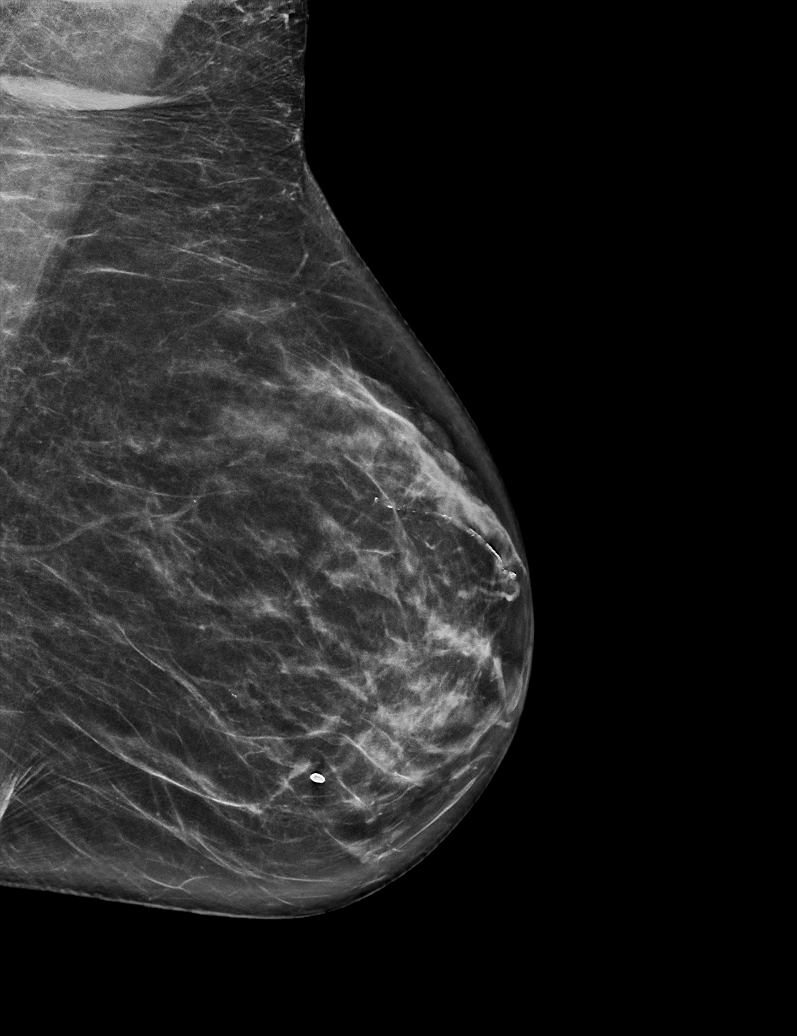

[R MLO tomo · tomo slice 35/70.0]
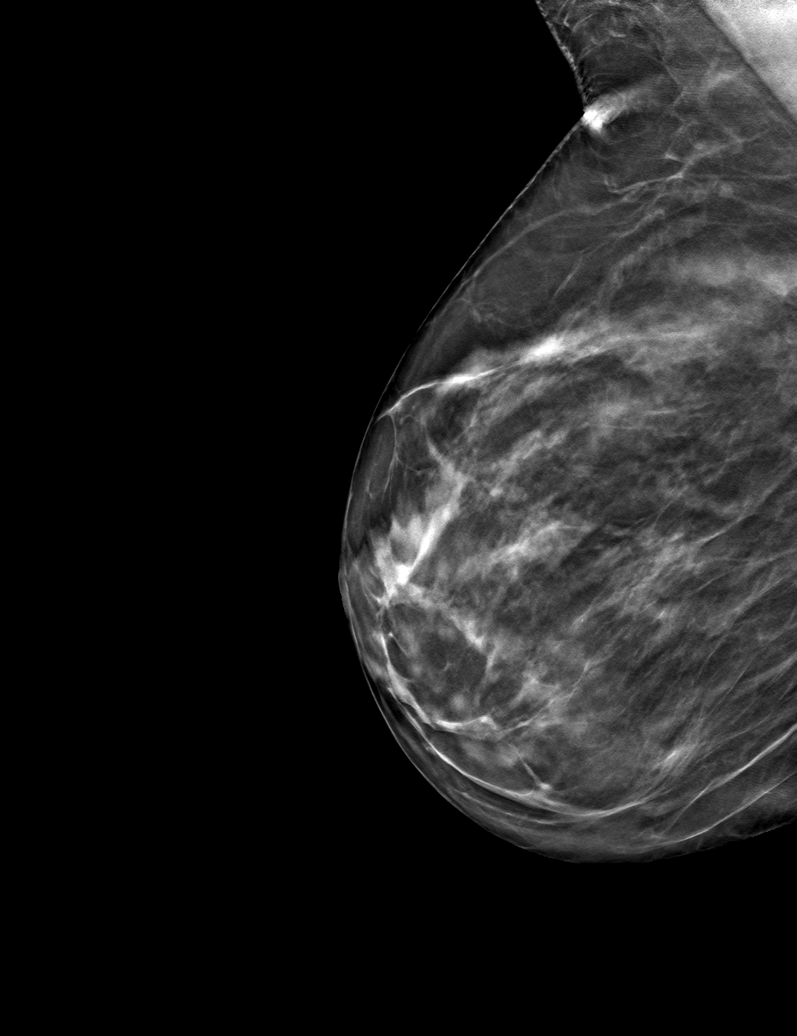

[6 of 30 positions shown; findings below may reference images not displayed]

ACR Breast Density Category c: The breast tissue is heterogeneously
dense, which may obscure small masses.
FINDINGS: There are no findings suspicious for malignancy. Images were
processed with CAD.
IMPRESSION: No mammographic evidence of malignancy. A result letter of this
screening mammogram will be mailed directly to the patient.

RECOMMENDATION:
Screening mammogram in one year. (Code:[5V])

BI-RADS CATEGORY  1: Negative.

## 2019-04-11 ENCOUNTER — Other Ambulatory Visit: Payer: Self-pay | Admitting: Family Medicine

## 2019-04-11 DIAGNOSIS — E785 Hyperlipidemia, unspecified: Secondary | ICD-10-CM

## 2019-04-11 DIAGNOSIS — M810 Age-related osteoporosis without current pathological fracture: Secondary | ICD-10-CM

## 2019-04-14 ENCOUNTER — Telehealth: Payer: Self-pay

## 2019-04-14 NOTE — Telephone Encounter (Signed)
LVM w COVID screen, front door and back lab info 12.7.2020 TLJ

## 2019-04-15 ENCOUNTER — Other Ambulatory Visit: Payer: Self-pay

## 2019-04-15 ENCOUNTER — Other Ambulatory Visit (INDEPENDENT_AMBULATORY_CARE_PROVIDER_SITE_OTHER): Payer: Medicare Other

## 2019-04-15 DIAGNOSIS — M810 Age-related osteoporosis without current pathological fracture: Secondary | ICD-10-CM | POA: Diagnosis not present

## 2019-04-15 DIAGNOSIS — E785 Hyperlipidemia, unspecified: Secondary | ICD-10-CM

## 2019-04-15 LAB — BASIC METABOLIC PANEL
BUN: 30 mg/dL — ABNORMAL HIGH (ref 6–23)
CO2: 29 mEq/L (ref 19–32)
Calcium: 9.1 mg/dL (ref 8.4–10.5)
Chloride: 105 mEq/L (ref 96–112)
Creatinine, Ser: 0.65 mg/dL (ref 0.40–1.20)
GFR: 87.15 mL/min (ref >60.00)
Glucose, Bld: 98 mg/dL (ref 70–99)
Potassium: 3.7 mEq/L (ref 3.5–5.1)
Sodium: 140 mEq/L (ref 135–145)

## 2019-04-15 LAB — LIPID PANEL
Cholesterol: 155 mg/dL (ref 0–200)
HDL: 77.4 mg/dL (ref >39.00)
LDL Cholesterol: 61 mg/dL (ref 0–99)
NonHDL: 78.09
Total CHOL/HDL Ratio: 2
Triglycerides: 83 mg/dL (ref 0.0–149.0)
VLDL: 16.6 mg/dL (ref 0.0–40.0)

## 2019-04-21 ENCOUNTER — Other Ambulatory Visit: Payer: Self-pay

## 2019-04-21 ENCOUNTER — Encounter: Payer: Self-pay | Admitting: Family Medicine

## 2019-04-21 ENCOUNTER — Ambulatory Visit (INDEPENDENT_AMBULATORY_CARE_PROVIDER_SITE_OTHER): Payer: Medicare Other | Admitting: Family Medicine

## 2019-04-21 VITALS — BP 102/60 | HR 98 | Temp 97.7°F | Ht 64.0 in | Wt 150.8 lb

## 2019-04-21 DIAGNOSIS — M25561 Pain in right knee: Secondary | ICD-10-CM

## 2019-04-21 DIAGNOSIS — M25562 Pain in left knee: Secondary | ICD-10-CM

## 2019-04-21 DIAGNOSIS — G8929 Other chronic pain: Secondary | ICD-10-CM

## 2019-04-21 DIAGNOSIS — E785 Hyperlipidemia, unspecified: Secondary | ICD-10-CM

## 2019-04-21 DIAGNOSIS — K219 Gastro-esophageal reflux disease without esophagitis: Secondary | ICD-10-CM

## 2019-04-21 MED ORDER — ROSUVASTATIN CALCIUM 5 MG PO TABS: 5.0000 mg | ORAL_TABLET | Freq: Every day | ORAL | 3 refills | Status: DC

## 2019-04-21 NOTE — Progress Notes (Signed)
   Subjective:    Patient ID: Sherry Lamb, female    DOB: 01-Feb-1935, 84 y.o.   MRN: [id number]  HPI Chief Complaint  Patient presents with  . Follow-up   This is an 84 yo female whp presents today for follow up of chronic medical conditions.   Bilateral knee pain. Saw [facility] and had gel injections. No improvement. Did not return for follow up. Had injection by pain management, no improvement. Has had PT in past, none recently. Take NSAIDs sparingly. Able to walk for short distances. Pain in morning getting out of bed.   Osteoporosis- tolerating weekly alendronate. Last bone density was 03/14/2018.   Hyperlipidemia- very well controlled on rosuvastatin 10 mg. Labs 04/15/2019 showed total cholesterol 155, LDL 61, HDL 77. No side effects of medication.   GERD- successfully weaned PPI. No symptoms.   Past Medical History:  Diagnosis Date  . Arthritis   . Frequent headaches   . GERD (gastroesophageal reflux disease)   . Hyperlipidemia    Past Surgical History:  Procedure Laterality Date  . ABDOMINAL HYSTERECTOMY    . EYE SURGERY    . Left hip replacement Left 2013  . right hip replacement Right 2000  . TONSILLECTOMY AND ADENOIDECTOMY  1943   Family History  Problem Relation Age of Onset  . Arthritis Mother   . Asthma Mother   . Hearing loss Mother   . Early death Father   . Heart disease Father   . Hyperlipidemia Father   . Kidney disease Father   . Depression Brother   . Hyperlipidemia Brother   . Breast cancer Neg Hx    Social History   Tobacco Use  . Smoking status: Never Smoker  . Smokeless tobacco: Never Used  Substance Use Topics  . Alcohol use: Yes  . Drug use: Never      Review of Systems     Objective:   Physical Exam    BP 102/60 (BP Location: Left Arm, Patient Position: Sitting, Cuff Size: Normal)   Pulse 98   Temp 97.7 F (36.5 C) (Temporal)   Ht 5\' 4"  (1.626 m)   Wt 150 lb 12.8 oz (68.4 kg)   SpO2 99%   BMI 25.88 kg/m  Wt Readings  from Last 3 Encounters:  04/21/19 150 lb 12.8 oz (68.4 kg)  04/03/18 153 lb (69.4 kg)  03/13/18 151 lb 6.4 oz (68.7 kg)   BP Readings from Last 3 Encounters:  04/21/19 102/60  04/03/18 120/70  03/13/18 128/80       Assessment & Plan:  1. Bilateral chronic knee pain - discussed her options of returning to ortho, second opinion, additional PT. She did not want to anything at this time, encouraged her to let me know if she wants me to facilitate additional interventions.   2. Hyperlipidemia, unspecified hyperlipidemia type - will decrease her rosuvastatin to 5 mg daily and recheck in 6-12 months  3. Gastroesophageal reflux disease, unspecified whether esophagitis present - no symptoms, off PPI  - follow up for AWV  This visit occurred during the SARS-CoV-2 public health emergency.  Safety protocols were in place, including screening questions prior to the visit, additional usage of staff PPE, and extensive cleaning of exam room while observing appropriate contact time as indicated for disinfecting solutions.    Clarene Reamer, FNP-BC  [facility], [facility]  04/24/2019 11:26 AM

## 2019-04-24 ENCOUNTER — Encounter: Payer: Self-pay | Admitting: Family Medicine

## 2019-04-28 ENCOUNTER — Telehealth: Payer: Self-pay

## 2019-04-28 NOTE — Telephone Encounter (Signed)
Please call patient and clarify that she can decrease her vitamin D level to 1000-2000 iu daily. She should just use what she has on hand. Can even take every other day.

## 2019-04-28 NOTE — Telephone Encounter (Signed)
Patient called about 2 things: 1) patient was asking if Crestor was to change to 5 mg now. Her mail order pharmacy emailed her stating 10 mg was been shipped to her. I reviewed the last office note and advised patient that Crestor 5 mg was sent to [facility] on 04/21/2019. Advised patient to call her pharmacy to see if she can get Crestor 10 mg on hold before they ship it. Patient understood.  2) patient wanted to know about her Vitamin D, she is taking 4000 units daily and thought that she needed to cut back to 2000 units daily? Please review

## 2019-04-29 NOTE — Telephone Encounter (Signed)
Left message for patient

## 2019-04-29 NOTE — Telephone Encounter (Signed)
Pt aware of medication recommendations. Nothing further needed.

## 2019-05-01 ENCOUNTER — Other Ambulatory Visit: Payer: Self-pay | Admitting: Family Medicine

## 2019-05-15 ENCOUNTER — Encounter: Payer: Self-pay | Admitting: Podiatry

## 2019-05-15 ENCOUNTER — Other Ambulatory Visit: Payer: Self-pay

## 2019-05-15 ENCOUNTER — Ambulatory Visit (INDEPENDENT_AMBULATORY_CARE_PROVIDER_SITE_OTHER): Payer: Medicare Other | Admitting: Podiatry

## 2019-05-15 DIAGNOSIS — M79676 Pain in unspecified toe(s): Secondary | ICD-10-CM | POA: Diagnosis not present

## 2019-05-15 DIAGNOSIS — B351 Tinea unguium: Secondary | ICD-10-CM | POA: Diagnosis not present

## 2019-05-15 DIAGNOSIS — L608 Other nail disorders: Secondary | ICD-10-CM

## 2019-05-15 NOTE — Progress Notes (Signed)
Complaint:  Visit Type: Patient returns to my office for continued preventative foot care services. Complaint: Patient states" my nails have grown long and thick and become painful to walk and wear shoes" Patient has been dealing with ingrown nails right big toe.. The patient presents for preventative foot care services. No changes to ROS  Podiatric Exam: Vascular: dorsalis pedis and posterior tibial pulses are palpable bilateral. Capillary return is immediate. Temperature gradient is WNL. Skin turgor WNL  Sensorium: Normal Semmes Weinstein monofilament test. Normal tactile sensation bilaterally. Nail Exam: Pt has thick disfigured discolored nails with subungual debris noted bilateral entire nail hallux through fifth toenails.  Pincer nails  B/L. Incurvation lateral border right great toenail. Ulcer Exam: There is no evidence of ulcer or pre-ulcerative changes or infection. Orthopedic Exam: Muscle tone and strength are WNL. No limitations in general ROM. No crepitus or effusions noted. Foot type and digits show no abnormalities. Bony prominences are unremarkable. Skin: No Porokeratosis. No infection or ulcers  Diagnosis:  Onychomycosis, , Pain in right toe, pain in left toes  Treatment & Plan Procedures and Treatment: Consent by patient was obtained for treatment procedures.   Debridement of mycotic and hypertrophic toenails, 1 through 5 bilateral and clearing of subungual debris. No ulceration, no infection noted.  Return Visit-Office Procedure: Patient instructed to return to the office for a follow up visit 10 weeks  for continued evaluation and treatment.    Gardiner Barefoot DPM

## 2019-06-23 ENCOUNTER — Telehealth: Payer: Self-pay | Admitting: Family Medicine

## 2019-06-23 NOTE — Telephone Encounter (Signed)
Please call patient and clarify if this is for her bilateral knee pain?

## 2019-06-23 NOTE — Telephone Encounter (Signed)
Pt is requesting a referral to Dr. Marry Guan for ortho. She states she has mentioned this in the past.

## 2019-06-24 NOTE — Telephone Encounter (Signed)
Pt states that it is bilateral knee pain - posterior and anterior pain

## 2019-06-25 ENCOUNTER — Other Ambulatory Visit: Payer: Self-pay | Admitting: Family Medicine

## 2019-06-25 DIAGNOSIS — M25562 Pain in left knee: Secondary | ICD-10-CM

## 2019-06-25 DIAGNOSIS — G8929 Other chronic pain: Secondary | ICD-10-CM

## 2019-06-25 NOTE — Telephone Encounter (Signed)
Please call patient and let her know that the referral has been placed.

## 2019-06-27 NOTE — Telephone Encounter (Signed)
Pt scheduled with Dr Lorelei Pont on Monday 2/22 - she will hold her Ortho appt for next Thursday just in case he wants her to be seen by Ortho.

## 2019-06-27 NOTE — Telephone Encounter (Signed)
Pt aware that the referral has been placed.   Pt states that she got in touch with the Ortho office but they were not able to get her in with the doctor she wanted, she is seeing the NP so she is concerned with this. She was asking if there was someone within [place] that can see her? Would Dr Lorelei Pont be able to see her?

## 2019-06-27 NOTE — Telephone Encounter (Signed)
Please call patient let her know that Dr. Lorelei Pont would be happy to see her.  Also reassure her that the advanced practice providers in the orthopedic offices are very good and the physicians are there and available as needed.  I think she would be in good hands either way.  She may be able to get in a little quicker with Dr. Lorelei Pont.

## 2019-06-30 ENCOUNTER — Other Ambulatory Visit: Payer: Self-pay

## 2019-06-30 ENCOUNTER — Ambulatory Visit (INDEPENDENT_AMBULATORY_CARE_PROVIDER_SITE_OTHER): Payer: Medicare Other | Admitting: Family Medicine

## 2019-06-30 ENCOUNTER — Encounter: Payer: Self-pay | Admitting: Family Medicine

## 2019-06-30 ENCOUNTER — Ambulatory Visit (INDEPENDENT_AMBULATORY_CARE_PROVIDER_SITE_OTHER)
Admission: RE | Admit: 2019-06-30 | Discharge: 2019-06-30 | Disposition: A | Discharge status: Home / Self Care | Payer: Medicare Other | Source: Ambulatory Visit | Attending: Family Medicine | Admitting: Family Medicine

## 2019-06-30 VITALS — BP 130/70 | HR 92 | Temp 97.5°F | Ht 64.0 in | Wt 152.2 lb

## 2019-06-30 DIAGNOSIS — M25561 Pain in right knee: Secondary | ICD-10-CM

## 2019-06-30 DIAGNOSIS — M1712 Unilateral primary osteoarthritis, left knee: Secondary | ICD-10-CM | POA: Diagnosis not present

## 2019-06-30 DIAGNOSIS — M25562 Pain in left knee: Secondary | ICD-10-CM

## 2019-06-30 DIAGNOSIS — G8929 Other chronic pain: Secondary | ICD-10-CM

## 2019-06-30 DIAGNOSIS — M1711 Unilateral primary osteoarthritis, right knee: Secondary | ICD-10-CM | POA: Diagnosis not present

## 2019-06-30 DIAGNOSIS — M170 Bilateral primary osteoarthritis of knee: Secondary | ICD-10-CM | POA: Diagnosis not present

## 2019-06-30 MED ORDER — METHYLPREDNISOLONE ACETATE 40 MG/ML IJ SUSP
80.0000 mg | Freq: Once | INTRAMUSCULAR | Status: AC
  Administered 2019-06-30: 12:00:00 80 mg via INTRA_ARTICULAR

## 2019-06-30 MED ORDER — METHYLPREDNISOLONE ACETATE 40 MG/ML IJ SUSP
80.0000 mg | Freq: Once | INTRAMUSCULAR | Status: AC
  Administered 2019-06-30: 80 mg via INTRA_ARTICULAR

## 2019-06-30 NOTE — Patient Instructions (Signed)
OSTEOARTHRITIS:  For symptomatic relief:  Tylenol: 2 tablets up to 3-4 times a day Regular NSAIDS are helpful (avoid in kidney disease and ulcers)  Topical Capzaicin Cream, as needed (wear glove to put on) - THIS IS EXCEPTIONALLY HOT Supplements: Tart cherry juice and Curcumin (Turmeric extract) have good scientific evidence  For flares, corticosteroid injections help. Hyaluronic Acid injections have good success, average relief is 6 months  Glucosamine and Chondroitin often helpful - will take about 3 months to see if you have an effect. If you do, great, keep them up, if none at that point, no need to take in the future.   Ice joints on bad days, 20 min, 2-3 x / day REGULAR EXERCISE: swimming.  Yoga?  Weight loss will always take stress off of the joints and back

## 2019-06-30 NOTE — Progress Notes (Signed)
Sherry Lamb T. Oliviarose Punch, MD Primary Care and Sports Medicine [facility] at [facility] [place], [address] Phone: [phone]  FAX: [phone]  Sherry Lamb - 85 y.o. female  MRN [id number]  Date of Birth: 18-Nov-1934  Visit Date: 06/30/2019  PCP: Elby Beck, FNP  Referred by: Elby Beck, FNP  Chief Complaint  Patient presents with  . Knee Pain    Bilateral    This visit occurred during the SARS-CoV-2 public health emergency.  Safety protocols were in place, including screening questions prior to the visit, additional usage of staff PPE, and extensive cleaning of exam room while observing appropriate contact time as indicated for disinfecting solutions.   Subjective:   Sherry Lamb is a 85 y.o. very pleasant female patient with Body mass index is 26.12 kg/m. who presents with the following:  She is a pleasant lady and she presents with a chronic issue of some ongoing bilateral knee pain. B OA of the knee. She has been having some escalating problems with her knees at least for 1 year, but on secondary questioning, this may have been longer than this upwards of 2 or 3 years.  She has no significant history of traumatic fracture or surgery.  She does recall falling on her knees 15 to 20 years ago.  Her symptoms are right greater than left.  She has tried multiple medications including Tylenol as well as Aleve regularly.  She is also tried multiple topical medications as used below.  Currently secondary to pain, she has minimal ability to exercise and she is having difficulty rising from a seated position  She has had 4 injections of viscosupplementation, none of these has helped significantly.  She has remotely had some steroid injections.  Iced or months, no relief.  Used some topicals.  Using sombra with a lot of menthol.  Voltaren gel no help Alleve but not schedulted.  Tylenol.  Minimal level of activity.  Can walk ok.   Night in bed and it will hit it hard.   4 trials of visco. B knee injections, steroids   Review of Systems is noted in the HPI, as appropriate   Objective:   BP 130/70   Pulse 92   Temp (!) 97.5 F (36.4 C) (Temporal)   Ht 5\' 4"  (1.626 m)   Wt 152 lb 3 oz (69 kg)   SpO2 98%   BMI 26.12 kg/m   GEN: No acute distress; alert,appropriate. PULM: Breathing comfortably in no respiratory distress PSYCH: Normally interactive.    She has a notable antalgia with movement, she has a very difficult time rising from her chair.  Right knee: Loss of 4 degrees of extension and flexion to approximately 93 degrees. Left knee: Loss of 2 degrees of extension and flexion to 105 degrees. She does have valgus angulation of both knees with standing  ACL, MCL, LCL, and PCL are all intact in both knees. Both knees have significant medial greater than lateral joint line tenderness. The patella are minimally movable. Significant crepitus bilaterally.  Additional testing is limited secondary to significant pain with flexion.  Radiology: No results found.  Assessment and Plan:     ICD-10-CM   1. Primary osteoarthritis of knees, bilateral  M17.0 methylPREDNISolone acetate (DEPO-MEDROL) injection 80 mg    methylPREDNISolone acetate (DEPO-MEDROL) injection 80 mg  2. Chronic pain of both knees  M25.561 DG Knee 4 Views W/Patella Left   M25.562 DG Knee 4 Views W/Patella  Right   G89.29    Total encounter time: 30 minutes. On the day of the patient encounter, this can include review of prior records, labs, and imaging.  Additional time can include counselling, consultation with peer MD in person or by telephone.  This also includes independent review of Radiology.  Chart review, radiographical personal interpretation and discussion with the patient, discussion of over-the-counter supplementation.  Diagnostic and therapeutic injection of corticosteroid today.  Quite significant tricompartmental  arthritis on plain film with bone-on-bone pathology laterally and with also some subchondral significant sclerosis in the lateral compartment as well.  Aspiration/Injection Procedure Note Sherry Lamb 1934-05-22 Date of procedure: 06/30/2019  Procedure: Large Joint Joint Aspiration / Injection of the Right Knee Indications: Pain  Procedure Details Patient verbally consented to procedure. Risks (including potential rare risk of infection), benefits, and alternatives explained. Sterilely prepped with Chloraprep. Ethyl cholride used for anesthesia. 8 cc Lidocaine 1% mixed with 2 mL Depo-Medrol 40 mg injected using the anteromedial approach without difficulty. No complications with procedure and tolerated well. Patient had decreased pain post-injection.  Medication: 2 mL of Depo-Medrol 40 mg, equaling Depo-Medrol 80 mg total  Aspiration/Injection Procedure Note Sherry Lamb 1935/02/10 Date of procedure: 06/30/2019  Procedure: Large Joint Aspiration / Injection of the Left Knee Indications: Pain  Procedure Details Patient verbally consented to procedure. Risks (including potential rare risk of infection), benefits, and alternatives explained. Sterilely prepped with Chloraprep. Ethyl cholride used for anesthesia. 8 cc Lidocaine 1% mixed with 2 mL Depo-Medrol 40 mg injected using the anteromedial approach without difficulty. No complications with procedure and tolerated well. Patient had decreased pain post-injection.  Medication: 2 mL of Depo-Medrol 40 mg, equaling Depo-Medrol 80 mg total  Patient Instructions  OSTEOARTHRITIS:  For symptomatic relief:  Tylenol: 2 tablets up to 3-4 times a day Regular NSAIDS are helpful (avoid in kidney disease and ulcers)  Topical Capzaicin Cream, as needed (wear glove to put on) - THIS IS EXCEPTIONALLY HOT Supplements: Tart cherry juice and Curcumin (Turmeric extract) have good scientific evidence  For flares, corticosteroid injections  help. Hyaluronic Acid injections have good success, average relief is 6 months  Glucosamine and Chondroitin often helpful - will take about 3 months to see if you have an effect. If you do, great, keep them up, if none at that point, no need to take in the future.   Ice joints on bad days, 20 min, 2-3 x / day REGULAR EXERCISE: swimming.  Yoga?  Weight loss will always take stress off of the joints and back     Follow-up: No follow-ups on file.  Meds ordered this encounter  Medications  . methylPREDNISolone acetate (DEPO-MEDROL) injection 80 mg  . methylPREDNISolone acetate (DEPO-MEDROL) injection 80 mg   There are no discontinued medications. Orders Placed This Encounter  Procedures  . DG Knee 4 Views W/Patella Left  . DG Knee 4 Views W/Patella Right    Signed,  Frederico Hamman T. Laquinn Shippy, MD   Outpatient Encounter Medications as of 06/30/2019  Medication Sig  . alendronate (FOSAMAX) 70 MG tablet TAKE 1 TABLET EVERY 7 DAYS WITH A FULL GLASS OF WATER ON AN EMPTY STOMACH  . aspirin EC 81 MG tablet Take 81 mg by mouth daily.  . Biotin 10 MG TABS Take 5,000 mg by mouth daily.   . Cholecalciferol (HM VITAMIN D3) 4000 units CAPS Take 4,000 Int'l Units by mouth daily.  . rosuvastatin (CRESTOR) 5 MG tablet Take 1 tablet (5 mg total) by mouth daily.  . [  EXPIRED] methylPREDNISolone acetate (DEPO-MEDROL) injection 80 mg   . [EXPIRED] methylPREDNISolone acetate (DEPO-MEDROL) injection 80 mg    No facility-administered encounter medications on file as of 06/30/2019.

## 2019-07-01 ENCOUNTER — Telehealth: Payer: Self-pay

## 2019-07-01 NOTE — Telephone Encounter (Signed)
Pt was seen in office on 02/22/21and received injection. Pt wants to know if received Hyaluronic acid injection. I advised pt she received Depomedrol which is a corticosteroid injection. Pt has previously had visco x 4 and pt said those injections did not help at all. Pt said the injection she got on 06/30/19 has helped a lot and her knees are much better today. Pt wanted to know if she needed FU and if so how soon and how long does effects of shot usually last. I advised pt to contact our office if she had increased knee pain or problems at any time. The benefit of corticosteroid inj is individualized and hopefully will last at least 3-6 mths or more. Pt would need to wait at least 3 mths before getting another corticosteroid inj. Pt voiced understanding. Pt also wanted to note that she started with H/A on 06/30/19 in the evening and last night the pain level for H/A was 8-9. Today still has H/A but not as painful but today her eyeballs hurt also; pt said it is not a migraine. Pt also wanted to know what mg of Glucosamine chondroitin she should take. Pt found med on [employer] Glucosamine 1500 mg, Chondroitin 1200 mg and tumeric 100 mg would that be OK? Pt request cb.

## 2019-07-02 NOTE — Telephone Encounter (Signed)
Sherry Lamb notified as instructed by telephone.

## 2019-07-02 NOTE — Telephone Encounter (Signed)
All of the above are fine.  These products are not FDA approved and will vary based on manufacturer - go by the label.  Headache will not be from her joint injection.

## 2019-07-07 ENCOUNTER — Telehealth: Payer: Self-pay

## 2019-07-07 NOTE — Telephone Encounter (Signed)
Mrs. Pagnozzi notified as instructed by telephone.  She states what she is wanting to know where she should be a week out from her knee injections.  She states the pain is reduced and she is able to sleep at night without pain which is wonderful.. She states her left knee is great.  She states the lower bottom of her right knee hurts but not as severe as it was.  She would rate it on a pain scale of 4 to 5 which before the injection was a 10.  She is wanting to know has she reached a plateau from the injection or does Dr. Lorelei Pont thinks she still may get some additional results.  She does not want to do surgery but just wants to know what to expect.  Please advise.

## 2019-07-07 NOTE — Telephone Encounter (Signed)
The right knee has severe "bone on bone" arthritis.  She can try the different supplements and they may help.  When she is ready considering a total knee replacement would be an appropriate step and consultation with a joint replacement surgeon.  Probably worthwhile to see if supplements may help, and that is certainly not emergent.

## 2019-07-07 NOTE — Telephone Encounter (Signed)
Sherry Lamb notified as instructed by telephone.  Patient states understanding.

## 2019-07-07 NOTE — Telephone Encounter (Signed)
Response is always variable, but 3-4 days will give maximal response. Will not get better  End-stage arthritic knees often do not have great results.

## 2019-07-07 NOTE — Telephone Encounter (Signed)
Pt was seen on 06/30/19 and given Depo Medrol in both knees; the lt knee is doing fine but the rt knee which is her worse knee is giving pts twinges of pain at pain level 3-4 which is not as bad as when seen on 06/30/19 but lower left side of rt knee is still puffy and hurting at pain level of 3-4.pt wants to know if will continue to see any improvement in rt knee since had shot one wk ago or will this probably be as good as it gets. Pt is taking glucosamine, Chondroitin,cherry juice and tumeric and pt understands can take up to 3 months to see effects from the supplements. The pain in back of both knees is OK now. Pt request cb after Dr Lorelei Pont reviews this note.

## 2019-07-28 ENCOUNTER — Ambulatory Visit: Payer: Medicare Other | Admitting: Podiatry

## 2019-07-29 ENCOUNTER — Ambulatory Visit (INDEPENDENT_AMBULATORY_CARE_PROVIDER_SITE_OTHER): Payer: Medicare Other | Admitting: Podiatry

## 2019-07-29 ENCOUNTER — Other Ambulatory Visit: Payer: Self-pay

## 2019-07-29 DIAGNOSIS — L603 Nail dystrophy: Secondary | ICD-10-CM

## 2019-07-29 DIAGNOSIS — B351 Tinea unguium: Secondary | ICD-10-CM

## 2019-07-29 DIAGNOSIS — M79676 Pain in unspecified toe(s): Secondary | ICD-10-CM | POA: Diagnosis not present

## 2019-07-30 ENCOUNTER — Encounter: Payer: Self-pay | Admitting: Podiatry

## 2019-07-30 NOTE — Progress Notes (Signed)
  Subjective:  Patient ID: Sherry Lamb, female    DOB: June 20, 1934,  MRN: [id number]  Chief Complaint  Patient presents with  . Nail Problem    nail trim   85 y.o. female returns for the above complaint.  Patient is here today for nail debridement as is causing her a lot of pain.  Patient states that they are thick elongated mycotic x10.  She states the right big toenail is getting worse there is a pincer deformity/nail dystrophy noted.  She states that Dr. Prudence Davidson generally tends to take it right out.  She would like to know if I could do the same thing.  She denies any other acute complaint.  She may have a little bit of an ingrown toenail.  Objective:  There were no vitals filed for this visit. Podiatric Exam: Vascular: dorsalis pedis and posterior tibial pulses are palpable bilateral. Capillary return is immediate. Temperature gradient is WNL. Skin turgor WNL  Sensorium: Normal Semmes Weinstein monofilament test. Normal tactile sensation bilaterally. Nail Exam: Pt has thick disfigured discolored nails with subungual debris noted bilateral entire nail hallux through fifth toenails.  Pincer toenail deformity noted to the right hallux Ulcer Exam: There is no evidence of ulcer or pre-ulcerative changes or infection. Orthopedic Exam: Muscle tone and strength are WNL. No limitations in general ROM. No crepitus or effusions noted. HAV  B/L.  Hammer toes 2-5  B/L. Skin: No Porokeratosis. No infection or ulcers  Assessment & Plan:  Patient was evaluated and treated and all questions answered.  Onychomycosis with pain with associated nail dystrophy of the right hallux -Nails palliatively debrided as below. -Educated on self-care -I discussed with her that she may have a little bit of an ingrown toenail especially at the corner of the right hallux is been bothering you.  I discussed with her possibly removing the corner in the near future if it continues to bother.  Patient states understanding and  will think about it  Procedure: Nail Debridement Rationale: pain  Type of Debridement: manual, sharp debridement. Instrumentation: Nail nipper, rotary burr. Number of Nails: 10  Procedures and Treatment: Consent by patient was obtained for treatment procedures. The patient understood the discussion of treatment and procedures well. All questions were answered thoroughly reviewed. Debridement of mycotic and hypertrophic toenails, 1 through 5 bilateral and clearing of subungual debris. No ulceration, no infection noted.  Return Visit-Office Procedure: Patient instructed to return to the office for a follow up visit 3 months for continued evaluation and treatment.  Boneta Lucks, DPM    No follow-ups on file.

## 2019-09-24 ENCOUNTER — Ambulatory Visit (INDEPENDENT_AMBULATORY_CARE_PROVIDER_SITE_OTHER): Payer: Medicare Other | Admitting: Family Medicine

## 2019-09-24 ENCOUNTER — Encounter: Payer: Self-pay | Admitting: Family Medicine

## 2019-09-24 ENCOUNTER — Other Ambulatory Visit: Payer: Self-pay

## 2019-09-24 VITALS — BP 120/70 | HR 58 | Temp 98.0°F | Ht 64.0 in | Wt 153.8 lb

## 2019-09-24 DIAGNOSIS — M170 Bilateral primary osteoarthritis of knee: Secondary | ICD-10-CM

## 2019-09-24 MED ORDER — METHYLPREDNISOLONE ACETATE 40 MG/ML IJ SUSP
80.0000 mg | Freq: Once | INTRAMUSCULAR | Status: AC
  Administered 2019-09-24: 80 mg via INTRA_ARTICULAR

## 2019-09-24 NOTE — Progress Notes (Signed)
Spencer T. Copland, MD, [place]  [facility] and [place] at [facility] [place], [address]  Phone: [phone]  FAX: [phone]  Nazirah Schuchardt - 85 y.o. female  MRN [id number]  Date of Birth: 04-03-1935  Date: 09/24/2019  PCP: Elby Beck, FNP  Referral: Elby Beck, FNP  Chief Complaint  Patient presents with  . Knee Pain    Bilateral    This visit occurred during the SARS-CoV-2 public health emergency.  Safety protocols were in place, including screening questions prior to the visit, additional usage of staff PPE, and extensive cleaning of exam room while observing appropriate contact time as indicated for disinfecting solutions.   Subjective:   Sherry Lamb is a 85 y.o. very pleasant female patient with Body mass index is 26.39 kg/m. who presents with the following:  Severe B knee OA: She has known advanced osteoarthritis of her knees bilaterally.  Pulled up her x-rays again and we reviewed these together in the office.  She has tricompartmental disease.  She has failed viscosupplementation in the past.  I did review some supplements with her, and she is currently taking some curcumin, as well as glucosamine conjoint and an oral hyaluronic acid, and she thinks that this does help somewhat.  B knee inj   Visco did not help in the past Taking supplements I gave her  Review of Systems is noted in the HPI, as appropriate   Objective:   BP 120/70   Pulse (!) 58   Temp 98 F (36.7 C) (Temporal)   Ht 5\' 4"  (1.626 m)   Wt 153 lb 12 oz (69.7 kg)   SpO2 90%   BMI 26.39 kg/m    GEN: No acute distress; alert,appropriate. PULM: Breathing comfortably in no respiratory distress PSYCH: Normally interactive.    Bilateral knee exam: She does have medial and lateral joint line tenderness and a lack of approximate 5 degrees in extension both knees.  She is able to flex her knees to  approximately 100 degrees.  ACL, MCL, LCL, and PCL are all stable.  She does have pain with any forced flexion.  Radiology: No results found.  Assessment and Plan:     ICD-10-CM   1. Primary osteoarthritis of knees, bilateral  M17.0 methylPREDNISolone acetate (DEPO-MEDROL) injection 80 mg    methylPREDNISolone acetate (DEPO-MEDROL) injection 80 mg   Challenging case, the patient does have end-stage joint disease in the knees.  In almost 25, she really does not want to have any form of operative intervention.  I think this is entirely reasonable, and we will do the best we can try and do make her as functional and possible have her pain controlled as possible.  Aspiration/Injection Procedure Note Latrinia Bashor 1935/02/26 Date of procedure: 09/24/2019  Procedure: Large Joint Joint Aspiration / Injection of the Right Knee Indications: Pain  Procedure Details Patient verbally consented to procedure. Risks (including potential rare risk of infection), benefits, and alternatives explained. Sterilely prepped with Chloraprep. Ethyl cholride used for anesthesia. 8 cc Lidocaine 1% mixed with 2 mL Depo-Medrol 40 mg injected using the anteromedial approach without difficulty. No complications with procedure and tolerated well. Patient had decreased pain post-injection.  Medication: 2 mL of Depo-Medrol 40 mg, equaling Depo-Medrol 80 mg total  Aspiration/Injection Procedure Note Lizanne Rei 06/02/1934 Date of procedure: 09/24/2019  Procedure: Large Joint Aspiration / Injection of the Left Knee Indications: Pain  Procedure Details Patient verbally consented  to procedure. Risks (including potential rare risk of infection), benefits, and alternatives explained. Sterilely prepped with Chloraprep. Ethyl cholride used for anesthesia. 8 cc Lidocaine 1% mixed with 2 mL Depo-Medrol 40 mg injected using the anteromedial approach without difficulty. No complications with procedure and tolerated well.  Patient had decreased pain post-injection.  Medication: 2 mL of Depo-Medrol 40 mg, equaling Depo-Medrol 80 mg total  Follow-up: No follow-ups on file.  Meds ordered this encounter  Medications  . methylPREDNISolone acetate (DEPO-MEDROL) injection 80 mg  . methylPREDNISolone acetate (DEPO-MEDROL) injection 80 mg   There are no discontinued medications. No orders of the defined types were placed in this encounter.   Signed,  Maud Deed. Copland, MD   Outpatient Encounter Medications as of 09/24/2019  Medication Sig  . alendronate (FOSAMAX) 70 MG tablet TAKE 1 TABLET EVERY 7 DAYS WITH A FULL GLASS OF WATER ON AN EMPTY STOMACH  . aspirin EC 81 MG tablet Take 81 mg by mouth daily.  . Biotin 10 MG TABS Take 5,000 mg by mouth daily.   . Cholecalciferol (HM VITAMIN D3) 4000 units CAPS Take 4,000 Int'l Units by mouth daily.  . Misc Natural Products (JOINT SUPPORT PO) Take 3 tablets by mouth daily.  . rosuvastatin (CRESTOR) 5 MG tablet Take 1 tablet (5 mg total) by mouth daily.  . [EXPIRED] methylPREDNISolone acetate (DEPO-MEDROL) injection 80 mg   . [EXPIRED] methylPREDNISolone acetate (DEPO-MEDROL) injection 80 mg    No facility-administered encounter medications on file as of 09/24/2019.

## 2019-09-25 ENCOUNTER — Telehealth: Payer: Self-pay | Admitting: Family Medicine

## 2019-09-25 NOTE — Telephone Encounter (Signed)
Please call -   I am doubtful from steroids. Same dose of depo-medrol. I have never heard of this as a possible reaction nor ever head a patient have migraine from steroids.  Physicians often use steroids like depo-medrol that I used for bad headaches.   I hope that she feels better soon.

## 2019-09-25 NOTE — Telephone Encounter (Signed)
Pt wanted to let Dr Lorelei Pont know that after her visit yesterday she came down with the worst migraine. She was sking if she received more of the Depo-Medrol than she did back in Feb. She is curious if maybe she was given a stronger dose which caused the migraine. I verified that we gave the same dose of injection yesterday as was given in Feb 2021. Pt states that the Migraine set in about 4pm. She experience eye pain, Nausea and vomiting and Loose Stools.  Still lingering today, taking Aleve PRN Pt is unable to check BP  Allergies  Allergen Reactions  . Penicillins Other (See Comments)    Stomach tightness.   Please advise, thanks.

## 2019-09-26 NOTE — Telephone Encounter (Signed)
Sherry Lamb notified as instructed by telephone.  She states she is feeling back to normal today.  The swelling in her knee is down and her migraine is gone.  Patient was very appreciative for the call and wanted to thank Dr. Lorelei Pont for everything he has done for her.

## 2019-10-20 ENCOUNTER — Other Ambulatory Visit: Payer: Self-pay | Admitting: Family Medicine

## 2019-10-20 ENCOUNTER — Ambulatory Visit (INDEPENDENT_AMBULATORY_CARE_PROVIDER_SITE_OTHER): Payer: Medicare Other

## 2019-10-20 DIAGNOSIS — E785 Hyperlipidemia, unspecified: Secondary | ICD-10-CM

## 2019-10-20 DIAGNOSIS — Z0000 Encounter for general adult medical examination without abnormal findings: Secondary | ICD-10-CM

## 2019-10-20 DIAGNOSIS — M810 Age-related osteoporosis without current pathological fracture: Secondary | ICD-10-CM

## 2019-10-20 NOTE — Progress Notes (Signed)
PCP notes:  Health Maintenance: No gaps noted- Patient will bring records of pneumonia vaccines   Abnormal Screenings: none   Patient concerns: none   Nurse concerns: none   Next PCP appt.: 10/27/2019 @ 10:30 am

## 2019-10-20 NOTE — Patient Instructions (Signed)
[name] , Thank you for taking time to come for your Medicare Wellness Visit. I appreciate your ongoing commitment to your health goals. Please review the following plan we discussed and let me know if I can assist you in the future.   Screening recommendations/referrals: Colonoscopy: no longer required Mammogram: Up to date, completed 03/19/2019 Bone Density: Up to date, completed 03/14/2018 Recommended yearly ophthalmology/optometry visit for glaucoma screening and checkup Recommended yearly dental visit for hygiene and checkup  Vaccinations: Influenza vaccine: Up to date, completed 02/11/2019 Pneumococcal vaccine: Completed series per patient  Tdap vaccine: decline Shingles vaccine: Completed series    Advanced directives: Please bring a copy of your POA (Power of Attorney) and/or Living Will to your next appointment.   Conditions/risks identified: hyperlipidemia  Next appointment: 10/27/2019 @ 10:30 am   Preventive Care 83 Years and Older, Female Preventive care refers to lifestyle choices and visits with your health care provider that can promote health and wellness. What does preventive care include?  A yearly physical exam. This is also called an annual well check.  Dental exams once or twice a year.  Routine eye exams. Ask your health care provider how often you should have your eyes checked.  Personal lifestyle choices, including:  Daily care of your teeth and gums.  Regular physical activity.  Eating a healthy diet.  Avoiding tobacco and drug use.  Limiting alcohol use.  Practicing safe sex.  Taking low-dose aspirin every day.  Taking vitamin and mineral supplements as recommended by your health care provider. What happens during an annual well check? The services and screenings done by your health care provider during your annual well check will depend on your age, overall health, lifestyle risk factors, and family history of disease. Counseling  Your  health care provider may ask you questions about your:  Alcohol use.  Tobacco use.  Drug use.  Emotional well-being.  Home and relationship well-being.  Sexual activity.  Eating habits.  History of falls.  Memory and ability to understand (cognition).  Work and work Statistician.  Reproductive health. Screening  You may have the following tests or measurements:  Height, weight, and BMI.  Blood pressure.  Lipid and cholesterol levels. These may be checked every 5 years, or more frequently if you are over 70 years old.  Skin check.  Lung cancer screening. You may have this screening every year starting at age 40 if you have a 30-pack-year history of smoking and currently smoke or have quit within the past 15 years.  Fecal occult blood test (FOBT) of the stool. You may have this test every year starting at age 37.  Flexible sigmoidoscopy or colonoscopy. You may have a sigmoidoscopy every 5 years or a colonoscopy every 10 years starting at age 27.  Hepatitis C blood test.  Hepatitis B blood test.  Sexually transmitted disease (STD) testing.  Diabetes screening. This is done by checking your blood sugar (glucose) after you have not eaten for a while (fasting). You may have this done every 1-3 years.  Bone density scan. This is done to screen for osteoporosis. You may have this done starting at age 58.  Mammogram. This may be done every 1-2 years. Talk to your health care provider about how often you should have regular mammograms. Talk with your health care provider about your test results, treatment options, and if necessary, the need for more tests. Vaccines  Your health care provider may recommend certain vaccines, such as:  Influenza vaccine. This  is recommended every year.  Tetanus, diphtheria, and acellular pertussis (Tdap, Td) vaccine. You may need a Td booster every 10 years.  Zoster vaccine. You may need this after age 71.  Pneumococcal 13-valent  conjugate (PCV13) vaccine. One dose is recommended after age 9.  Pneumococcal polysaccharide (PPSV23) vaccine. One dose is recommended after age 68. Talk to your health care provider about which screenings and vaccines you need and how often you need them. This information is not intended to replace advice given to you by your health care provider. Make sure you discuss any questions you have with your health care provider. Document Released: 05/21/2015 Document Revised: 01/12/2016 Document Reviewed: 02/23/2015 Elsevier Interactive Patient Education  2017 [employer] Prevention in the Home Falls can cause injuries. They can happen to people of all ages. There are many things you can do to make your home safe and to help prevent falls. What can I do on the outside of my home?  Regularly fix the edges of walkways and driveways and fix any cracks.  Remove anything that might make you trip as you walk through a door, such as a raised step or threshold.  Trim any bushes or trees on the path to your home.  Use bright outdoor lighting.  Clear any walking paths of anything that might make someone trip, such as rocks or tools.  Regularly check to see if handrails are loose or broken. Make sure that both sides of any steps have handrails.  Any raised decks and porches should have guardrails on the edges.  Have any leaves, snow, or ice cleared regularly.  Use sand or salt on walking paths during winter.  Clean up any spills in your garage right away. This includes oil or grease spills. What can I do in the bathroom?  Use night lights.  Install grab bars by the toilet and in the tub and shower. Do not use towel bars as grab bars.  Use non-skid mats or decals in the tub or shower.  If you need to sit down in the shower, use a plastic, non-slip stool.  Keep the floor dry. Clean up any water that spills on the floor as soon as it happens.  Remove soap buildup in the tub or  shower regularly.  Attach bath mats securely with double-sided non-slip rug tape.  Do not have throw rugs and other things on the floor that can make you trip. What can I do in the bedroom?  Use night lights.  Make sure that you have a light by your bed that is easy to reach.  Do not use any sheets or blankets that are too big for your bed. They should not hang down onto the floor.  Have a firm chair that has side arms. You can use this for support while you get dressed.  Do not have throw rugs and other things on the floor that can make you trip. What can I do in the kitchen?  Clean up any spills right away.  Avoid walking on wet floors.  Keep items that you use a lot in easy-to-reach places.  If you need to reach something above you, use a strong step stool that has a grab bar.  Keep electrical cords out of the way.  Do not use floor polish or wax that makes floors slippery. If you must use wax, use non-skid floor wax.  Do not have throw rugs and other things on the floor that can make you  trip. What can I do with my stairs?  Do not leave any items on the stairs.  Make sure that there are handrails on both sides of the stairs and use them. Fix handrails that are broken or loose. Make sure that handrails are as long as the stairways.  Check any carpeting to make sure that it is firmly attached to the stairs. Fix any carpet that is loose or worn.  Avoid having throw rugs at the top or bottom of the stairs. If you do have throw rugs, attach them to the floor with carpet tape.  Make sure that you have a light switch at the top of the stairs and the bottom of the stairs. If you do not have them, ask someone to add them for you. What else can I do to help prevent falls?  Wear shoes that:  Do not have high heels.  Have rubber bottoms.  Are comfortable and fit you well.  Are closed at the toe. Do not wear sandals.  If you use a stepladder:  Make sure that it is fully  opened. Do not climb a closed stepladder.  Make sure that both sides of the stepladder are locked into place.  Ask someone to hold it for you, if possible.  Clearly mark and make sure that you can see:  Any grab bars or handrails.  First and last steps.  Where the edge of each step is.  Use tools that help you move around (mobility aids) if they are needed. These include:  Canes.  Walkers.  Scooters.  Crutches.  Turn on the lights when you go into a dark area. Replace any light bulbs as soon as they burn out.  Set up your furniture so you have a clear path. Avoid moving your furniture around.  If any of your floors are uneven, fix them.  If there are any pets around you, be aware of where they are.  Review your medicines with your doctor. Some medicines can make you feel dizzy. This can increase your chance of falling. Ask your doctor what other things that you can do to help prevent falls. This information is not intended to replace advice given to you by your health care provider. Make sure you discuss any questions you have with your health care provider. Document Released: 02/18/2009 Document Revised: 09/30/2015 Document Reviewed: 05/29/2014 Elsevier Interactive Patient Education  2017 [employer].

## 2019-10-20 NOTE — Progress Notes (Signed)
Subjective:   Sherry Lamb is a 85 y.o. female who presents for Medicare Annual (Subsequent) preventive examination.  Review of Systems: N/A   I connected with the patient today by telephone and verified that I am speaking with the correct person using two identifiers. Location patient: home Location nurse: work Persons participating in the virtual visit: patient, Marine scientist.   I discussed the limitations, risks, security and privacy concerns of performing an evaluation and management service by telephone and the availability of in person appointments. I also discussed with the patient that there may be a patient responsible charge related to this service. The patient expressed understanding and verbally consented to this telephonic visit.    Interactive audio and video telecommunications were attempted between this nurse and patient, however failed, due to patient having technical difficulties OR patient did not have access to video capability.  We continued and completed visit with audio only.     Cardiac Risk Factors include: advanced age (>45men, >64 women);dyslipidemia     Objective:     Vitals: There were no vitals taken for this visit.  There is no height or weight on file to calculate BMI.  Advanced Directives 10/20/2019 09/18/2018  Does Patient Have a Medical Advance Directive? Yes Yes  Type of Paramedic of [place];Living will [place];Living will  Copy of [place] in Chart? No - copy requested No - copy requested    Tobacco Social History   Tobacco Use  Smoking Status Never Smoker  Smokeless Tobacco Never Used     Counseling given: Not Answered   Clinical Intake:  Pre-visit preparation completed: Yes  Pain : 0-10 Pain Score: 10-Worst pain ever Pain Type: Chronic pain Pain Location: Knee Pain Orientation: Left, Right Pain Descriptors / Indicators: Aching Pain Onset: More than a month ago Pain  Frequency: Intermittent     Nutritional Risks: None Diabetes: No  How often do you need to have someone help you when you read instructions, pamphlets, or other written materials from your doctor or pharmacy?: 1 - Never What is the last grade level you completed in school?: 1 year of college  Interpreter Needed?: No  Information entered by :: CJohnson, LPN  Past Medical History:  Diagnosis Date  . Arthritis   . Frequent headaches   . GERD (gastroesophageal reflux disease)   . Hyperlipidemia    Past Surgical History:  Procedure Laterality Date  . ABDOMINAL HYSTERECTOMY    . EYE SURGERY    . Left hip replacement Left 2013  . right hip replacement Right 2000  . TONSILLECTOMY AND ADENOIDECTOMY  1943   Family History  Problem Relation Age of Onset  . Arthritis Mother   . Asthma Mother   . Hearing loss Mother   . Early death Father   . Heart disease Father   . Hyperlipidemia Father   . Kidney disease Father   . Depression Brother   . Hyperlipidemia Brother   . Breast cancer Neg Hx    Social History   Socioeconomic History  . Marital status: Married    Spouse name: Not on file  . Number of children: Not on file  . Years of education: Not on file  . Highest education level: Not on file  Occupational History  . Not on file  Tobacco Use  . Smoking status: Never Smoker  . Smokeless tobacco: Never Used  Vaping Use  . Vaping Use: Never used  Substance and Sexual Activity  .  Alcohol use: Yes    Alcohol/week: 1.0 standard drink    Types: 1 Glasses of wine per week    Comment: 1 wine day   . Drug use: Never  . Sexual activity: Yes    Partners: Male  Other Topics Concern  . Not on file  Social History Narrative   Married to [employer] to [facility] from [facility] 2019 to be closer to family   Social Determinants of Health   Financial Resource Strain: Low Risk   . Difficulty of Paying Living Expenses: Not hard at all  Food Insecurity: No Food Insecurity  . Worried About  Charity fundraiser in the Last Year: Never true  . Ran Out of Food in the Last Year: Never true  Transportation Needs: No Transportation Needs  . Lack of Transportation (Medical): No  . Lack of Transportation (Non-Medical): No  Physical Activity: Inactive  . Days of Exercise per Week: 0 days  . Minutes of Exercise per Session: 0 min  Stress: No Stress Concern Present  . Feeling of Stress : Not at all  Social Connections:   . Frequency of Communication with Friends and Family:   . Frequency of Social Gatherings with Friends and Family:   . Attends Religious Services:   . Active Member of Clubs or Organizations:   . Attends Archivist Meetings:   Marland Kitchen Marital Status:     Outpatient Encounter Medications as of 10/20/2019  Medication Sig  . alendronate (FOSAMAX) 70 MG tablet TAKE 1 TABLET EVERY 7 DAYS WITH A FULL GLASS OF WATER ON AN EMPTY STOMACH  . aspirin EC 81 MG tablet Take 81 mg by mouth daily.  . Biotin 10 MG TABS Take 5,000 mg by mouth daily.   . Cholecalciferol (HM VITAMIN D3) 4000 units CAPS Take 4,000 Int'l Units by mouth daily.  . rosuvastatin (CRESTOR) 5 MG tablet Take 1 tablet (5 mg total) by mouth daily.  . [DISCONTINUED] Misc Natural Products (JOINT SUPPORT PO) Take 3 tablets by mouth daily.   No facility-administered encounter medications on file as of 10/20/2019.    Activities of Daily Living In your present state of health, do you have any difficulty performing the following activities: 10/20/2019  Hearing? N  Vision? N  Difficulty concentrating or making decisions? N  Walking or climbing stairs? N  Dressing or bathing? N  Doing errands, shopping? N  Preparing Food and eating ? N  Using the Toilet? N  In the past six months, have you accidently leaked urine? Y  Comment wears pantyliners  Do you have problems with loss of bowel control? N  Managing your Medications? N  Managing your Finances? N  Housekeeping or managing your Housekeeping? N  Some  recent data might be hidden    Patient Care Team: Elby Beck, FNP as PCP - General (Nurse Practitioner)    Assessment:   This is a routine wellness examination for [facility]  Exercise Activities and Dietary recommendations Current Exercise Habits: The patient does not participate in regular exercise at present, Exercise limited by: None identified  Goals    . Patient Stated     Starting 09/18/2018, I will continue to take medications as prescribed.     . Patient Stated     10/20/2019, I will maintain and continue medications as prescribed.        Fall Risk Fall Risk  10/20/2019 09/18/2018 09/10/2017  Falls in the past year? 0 0 No  Number falls  in past yr: 0 - -  Injury with Fall? 0 - -  Risk for fall due to : No Fall Risks - -  Follow up Falls evaluation completed;Falls prevention discussed - -   Is the patient's home free of loose throw rugs in walkways, pet beds, electrical cords, etc?   yes      Grab bars in the bathroom? yes      Handrails on the stairs?   yes      Adequate lighting?   yes  Timed Get Up and Go performed: N/A  Depression Screen PHQ 2/9 Scores 10/20/2019 09/18/2018 09/10/2017  PHQ - 2 Score 2 2 0  PHQ- 9 Score 2 2 -     Cognitive Function MMSE - Mini Mental State Exam 10/20/2019 09/18/2018  Orientation to time 5 5  Orientation to Place 5 5  Registration 3 3  Attention/ Calculation 5 0  Recall 3 3  Language- name 2 objects - 0  Language- repeat 1 1  Language- follow 3 step command - 0  Language- read & follow direction - 0  Write a sentence - 0  Copy design - 0  Total score - 17  Mini Cog  Mini-Cog screen was completed. Maximum score is 22. A value of 0 denotes this part of the MMSE was not completed or the patient failed this part of the Mini-Cog screening.       Immunization History  Administered Date(s) Administered  . Fluad Quad(high Dose 65+) 02/11/2019  . Influenza, High Dose Seasonal PF 03/13/2018  . PFIZER SARS-COV-2  Vaccination 05/26/2019, 06/16/2019  . Zoster Recombinat (Shingrix) 07/02/2018, 11/19/2018    Qualifies for Shingles Vaccine: Completed series  Screening Tests Health Maintenance  Topic Date Due  . PNA vac Low Risk Adult (1 of 2 - PCV13) Never done  . TETANUS/TDAP  05/07/2020 (Originally 09/27/1953)  . INFLUENZA VACCINE  12/07/2019  . DEXA SCAN  Completed  . COVID-19 Vaccine  Completed    Cancer Screenings: Lung: Low Dose CT Chest recommended if Age 22-80 years, 30 pack-year currently smoking OR have quit w/in 15 years. Patient does not qualify. Breast:  Up to date on Mammogram: Yes, completed 03/19/2019   Up to date of Bone Density/Dexa: Yes, completed 03/14/2018 Colorectal: no longer required  Additional Screenings:  Hepatitis C Screening: N/A     Plan:   Patient will maintain and continue medications as prescribed.   I have personally reviewed and noted the following in the patient's chart:   . Medical and social history . Use of alcohol, tobacco or illicit drugs  . Current medications and supplements . Functional ability and status . Nutritional status . Physical activity . Advanced directives . List of other physicians . Hospitalizations, surgeries, and ER visits in previous 12 months . Vitals . Screenings to include cognitive, depression, and falls . Referrals and appointments  In addition, I have reviewed and discussed with patient certain preventive protocols, quality metrics, and best practice recommendations. A written personalized care plan for preventive services as well as general preventive health recommendations were provided to patient.     Andrez Grime, LPN  1/96/2229

## 2019-10-21 ENCOUNTER — Other Ambulatory Visit (INDEPENDENT_AMBULATORY_CARE_PROVIDER_SITE_OTHER): Payer: Medicare Other

## 2019-10-21 ENCOUNTER — Other Ambulatory Visit: Payer: Medicare Other

## 2019-10-21 DIAGNOSIS — M810 Age-related osteoporosis without current pathological fracture: Secondary | ICD-10-CM | POA: Diagnosis not present

## 2019-10-21 DIAGNOSIS — E785 Hyperlipidemia, unspecified: Secondary | ICD-10-CM

## 2019-10-21 LAB — COMPREHENSIVE METABOLIC PANEL
ALT: 13 U/L (ref 0–35)
AST: 20 U/L (ref 0–37)
Albumin: 3.9 g/dL (ref 3.5–5.2)
Alkaline Phosphatase: 45 U/L (ref 39–117)
BUN: 30 mg/dL — ABNORMAL HIGH (ref 6–23)
CO2: 29 mEq/L (ref 19–32)
Calcium: 9.2 mg/dL (ref 8.4–10.5)
Chloride: 107 mEq/L (ref 96–112)
Creatinine, Ser: 0.51 mg/dL (ref 0.40–1.20)
GFR: 115.15 mL/min (ref >60.00)
Glucose, Bld: 93 mg/dL (ref 70–99)
Potassium: 4 mEq/L (ref 3.5–5.1)
Sodium: 140 mEq/L (ref 135–145)
Total Bilirubin: 0.6 mg/dL (ref 0.2–1.2)
Total Protein: 6.7 g/dL (ref 6.0–8.3)

## 2019-10-21 LAB — LIPID PANEL
Cholesterol: 179 mg/dL (ref 0–200)
HDL: 84.5 mg/dL (ref >39.00)
LDL Cholesterol: 79 mg/dL (ref 0–99)
NonHDL: 94.23
Total CHOL/HDL Ratio: 2
Triglycerides: 77 mg/dL (ref 0.0–149.0)
VLDL: 15.4 mg/dL (ref 0.0–40.0)

## 2019-10-24 NOTE — Progress Notes (Signed)
I reviewed health advisor's note, was available for consultation, and agree with documentation and plan.  

## 2019-10-27 ENCOUNTER — Other Ambulatory Visit: Payer: Self-pay

## 2019-10-27 ENCOUNTER — Encounter: Payer: Self-pay | Admitting: Family Medicine

## 2019-10-27 ENCOUNTER — Ambulatory Visit (INDEPENDENT_AMBULATORY_CARE_PROVIDER_SITE_OTHER): Payer: Medicare Other | Admitting: Family Medicine

## 2019-10-27 VITALS — BP 110/70 | HR 87 | Temp 98.0°F | Ht 64.0 in | Wt 154.8 lb

## 2019-10-27 DIAGNOSIS — M25561 Pain in right knee: Secondary | ICD-10-CM

## 2019-10-27 DIAGNOSIS — G8929 Other chronic pain: Secondary | ICD-10-CM

## 2019-10-27 DIAGNOSIS — M25562 Pain in left knee: Secondary | ICD-10-CM

## 2019-10-27 DIAGNOSIS — Z23 Encounter for immunization: Secondary | ICD-10-CM | POA: Diagnosis not present

## 2019-10-27 DIAGNOSIS — E785 Hyperlipidemia, unspecified: Secondary | ICD-10-CM | POA: Diagnosis not present

## 2019-10-27 DIAGNOSIS — M810 Age-related osteoporosis without current pathological fracture: Secondary | ICD-10-CM

## 2019-10-27 DIAGNOSIS — K219 Gastro-esophageal reflux disease without esophagitis: Secondary | ICD-10-CM

## 2019-10-27 NOTE — Progress Notes (Signed)
Subjective:    Patient ID: Sherry Lamb, female    DOB: Nov 21, 1934, 85 y.o.   MRN: [id number]  HPI Chief Complaint  Patient presents with  . Follow-up   This is an 85 yo female who presents today for follow up of chronic medical conditions.   Bilateral chronic knee pain- has seen ortho ([place]) and sports medicine ([place]) in the past. Has had multiple injections with little to short-term relief.  Has been told that she has "bone-on-bone," and likely needs a knee replacement.  She is very limited in her ability to do activities due to pain especially of her right knee.  She will use Aleve 1-2 times per day but is bothered to do things such as shopping, vacuuming and with sleep.  This limits her ability to enjoy herself and socialize with her friends.  Hypercholesterolemia-she had a very favorable lipid panel 6 months ago and we reduced her rosuvastatin.  Labs drawn prior to today's visit show good numbers with HDL greater than 80.    GERD-symptoms well controlled  Osteoporosis-she will be due DEXA scan with her mammogram this year.  Currently tolerating alendronate without side effects.    Review of Systems Per HPI    Objective:   Physical Exam Vitals reviewed.  Constitutional:      General: She is not in acute distress.    Appearance: Normal appearance. She is normal weight. She is not ill-appearing, toxic-appearing or diaphoretic.  HENT:     Head: Normocephalic and atraumatic.  Eyes:     Conjunctiva/sclera: Conjunctivae normal.  Cardiovascular:     Rate and Rhythm: Normal rate and regular rhythm.     Heart sounds: Normal heart sounds.  Pulmonary:     Breath sounds: Normal breath sounds.  Musculoskeletal:        General: Tenderness (right knee) present.  Skin:    General: Skin is warm and dry.  Neurological:     Mental Status: She is alert and oriented to person, place, and time.  Psychiatric:        Mood and Affect: Mood normal.        Behavior: Behavior normal.         Thought Content: Thought content normal.        Judgment: Judgment normal.       BP 110/70 (BP Location: Left Arm, Patient Position: Sitting, Cuff Size: Normal)   Pulse 87   Temp 98 F (36.7 C) (Temporal)   Ht 5\' 4"  (1.626 m)   Wt 154 lb 12.8 oz (70.2 kg)   SpO2 96%   BMI 26.57 kg/m  Wt Readings from Last 3 Encounters:  10/27/19 154 lb 12.8 oz (70.2 kg)  09/24/19 153 lb 12 oz (69.7 kg)  06/30/19 152 lb 3 oz (69 kg)       Assessment & Plan:  1. Need for pneumococcal vaccination - Pneumococcal conjugate vaccine 13-valent  2. Age-related osteoporosis without current pathological fracture - DG Bone Density; Future  3. Bilateral chronic knee pain -Discussed her pain and its interference with her daily activities and disrupting her quality of life.  She is going to get in touch with Dr. Lynnsey Ralphs and make an appointment to discuss possible knee replacement  4. Gastroesophageal reflux disease, unspecified whether esophagitis present -Has been doing well off medications without symptoms  5. Hyperlipidemia, unspecified hyperlipidemia type -Reviewed her most recent lipid panel.  We will continue her on rosuvastatin 5 mg  -Follow-up in 6 months This visit occurred during  the SARS-CoV-2 public health emergency.  Safety protocols were in place, including screening questions prior to the visit, additional usage of staff PPE, and extensive cleaning of exam room while observing appropriate contact time as indicated for disinfecting solutions.      Clarene Reamer, FNP-BC  [facility]  10/27/2019 2:14 PM

## 2019-10-27 NOTE — Patient Instructions (Signed)
Good to see you today  Please follow up with Dr. Taydem Ralphs, ? Time for right knee replacement

## 2019-10-30 ENCOUNTER — Ambulatory Visit (INDEPENDENT_AMBULATORY_CARE_PROVIDER_SITE_OTHER): Payer: Medicare Other | Admitting: Podiatry

## 2019-10-30 ENCOUNTER — Encounter: Payer: Self-pay | Admitting: Podiatry

## 2019-10-30 ENCOUNTER — Other Ambulatory Visit: Payer: Self-pay

## 2019-10-30 DIAGNOSIS — M79676 Pain in unspecified toe(s): Secondary | ICD-10-CM

## 2019-10-30 DIAGNOSIS — L603 Nail dystrophy: Secondary | ICD-10-CM | POA: Diagnosis not present

## 2019-10-30 DIAGNOSIS — B351 Tinea unguium: Secondary | ICD-10-CM

## 2019-10-30 NOTE — Progress Notes (Signed)
This patient returns to the office for evaluation and treatment of long thick painful nails .  This patient is unable to trim her own nails since the patient cannot reach her feet.  Patient says the nails are painful walking and wearing her shoes.  She returns for preventive foot care services.  General Appearance  Alert, conversant and in no acute stress.  Vascular  Dorsalis pedis and posterior tibial  pulses are palpable  bilaterally.  Capillary return is within normal limits  bilaterally. Temperature is within normal limits  bilaterally.  Neurologic  Senn-Weinstein monofilament wire test within normal limits  bilaterally. Muscle power within normal limits bilaterally.  Nails Thick disfigured discolored nails with subungual debris  from hallux to fifth toes bilaterally. Pincer nail right hallux. No evidence of bacterial infection or drainage bilaterally.  Orthopedic  No limitations of motion  feet .  No crepitus or effusions noted.  HAV  B/L.  Hammer toes 2-5  B/L.  Skin  normotropic skin with no porokeratosis noted bilaterally.  No signs of infections or ulcers noted.     Onychomycosis  Pain in toes right foot  Pain in toes left foot  Debridement  of nails  1-5  B/L with a nail nipper.  Nails were then filed using a dremel tool with no incidents.    RTC  3 months    Gardiner Barefoot DPM

## 2019-12-11 DIAGNOSIS — M170 Bilateral primary osteoarthritis of knee: Secondary | ICD-10-CM | POA: Diagnosis not present

## 2019-12-11 DIAGNOSIS — M1711 Unilateral primary osteoarthritis, right knee: Secondary | ICD-10-CM | POA: Diagnosis not present

## 2019-12-11 DIAGNOSIS — M1712 Unilateral primary osteoarthritis, left knee: Secondary | ICD-10-CM | POA: Diagnosis not present

## 2019-12-17 ENCOUNTER — Telehealth: Payer: Self-pay

## 2019-12-17 NOTE — Telephone Encounter (Signed)
Pt left v/m; pt has seen 3 different doctors about a dental problem; yesterday pt saw a doctor and has an old root canal that has infection in it. That doctor could do another root canal and repair the tooth but the doctor concerned that pt is on fosamax and that could hinder the heeling of the tooth. Pt was given option of having tooth repaired or pulled and pt wants to save the tooth if possible. Pt request cb with more information about how fosamax is affecting her tooth.

## 2019-12-17 NOTE — Telephone Encounter (Signed)
Called and spoke with patient regarding her concerns.  She is currently dealing with 2 weeks of dental pain for which she has seen a periodontist and a root canal specialist.  She has an upcoming appointment with oral maxillofacial specialist in [place], Dr. Lewanda Rife.  In reviewing her records and I discussed this with her, I have had her on Fosamax for approximately 1 year.  She reports that prior she had been on Fosamax in [facility] for about 10 years but was off for about 10 years prior to being restarted by me.  I discussed the current guidelines with her regarding recommendations for dental work and reviewed her risks.  She has not had any cancer, chemotherapy, frequent steroids, diabetes or smoking which would put her at increased risk of problems with dental procedures.  I have offered to speak with her dentist if necessary and she will pass along to Dr. Lewanda Rife.  She had been told that she had a dental infection and has been given a prescription to take if needed.  She reports that the tooth has been draining and feels much better now.  She is going out of town later this week and I advised her to have the prescription filled and take with her and to start if she develops increased pain, redness, swelling, fever.

## 2020-01-15 ENCOUNTER — Telehealth: Payer: Self-pay | Admitting: Family Medicine

## 2020-01-15 NOTE — Telephone Encounter (Signed)
Pt called stating [facility] [place] she is not happy with results she is getting and wanted to know if there was another dr in same practice.  Pt has not called them to ask if she could switch provider  She was wanting debbie advise on who to go to at [place]

## 2020-01-15 NOTE — Telephone Encounter (Signed)
Please call patient and see if she is just needing someone for nail care? I would recommend she call the practice and see if it is allowable to switch to another provider in the office.

## 2020-01-16 NOTE — Telephone Encounter (Signed)
Called pt and she will call the practice to switch providers.

## 2020-01-20 ENCOUNTER — Other Ambulatory Visit: Payer: Self-pay | Admitting: Family Medicine

## 2020-01-20 DIAGNOSIS — M810 Age-related osteoporosis without current pathological fracture: Secondary | ICD-10-CM

## 2020-01-21 DIAGNOSIS — Z23 Encounter for immunization: Secondary | ICD-10-CM | POA: Diagnosis not present

## 2020-01-27 ENCOUNTER — Ambulatory Visit (INDEPENDENT_AMBULATORY_CARE_PROVIDER_SITE_OTHER): Payer: Medicare Other | Admitting: Podiatry

## 2020-01-27 ENCOUNTER — Ambulatory Visit (INDEPENDENT_AMBULATORY_CARE_PROVIDER_SITE_OTHER): Payer: Medicare Other

## 2020-01-27 ENCOUNTER — Other Ambulatory Visit: Payer: Self-pay

## 2020-01-27 ENCOUNTER — Encounter: Payer: Self-pay | Admitting: Podiatry

## 2020-01-27 DIAGNOSIS — M19071 Primary osteoarthritis, right ankle and foot: Secondary | ICD-10-CM

## 2020-01-27 DIAGNOSIS — M778 Other enthesopathies, not elsewhere classified: Secondary | ICD-10-CM | POA: Diagnosis not present

## 2020-01-28 DIAGNOSIS — M1711 Unilateral primary osteoarthritis, right knee: Secondary | ICD-10-CM | POA: Diagnosis not present

## 2020-01-28 NOTE — Progress Notes (Signed)
Subjective:  Patient ID: Sherry Lamb, female    DOB: [birth year],  MRN: [id number]  Chief Complaint  Patient presents with  . Ankle Pain    Patient presents today for right ankle/foot swelling and pain x 2 weeks    85 y.o. female presents with the above complaint.  Patient presents with right lateral ankle sprain/capsulitis.  Patient states that this has been going on for 2 weeks as progressing on worse.  Patient states painful to touch.  It is painful to ambulate on.  She states that she does have arthritis as well as surgery that was done long time ago to fix the ankle.  She just wants to make sure that everything is doing okay and would like to discuss treatment options.  She has not seen anyone else prior to see me.  She normally sees Dr. Prudence Davidson for routine foot care.  She denies any other acute complaints.   Review of Systems: Negative except as noted in the HPI. Denies N/V/F/Ch.  Past Medical History:  Diagnosis Date  . Arthritis   . Frequent headaches   . GERD (gastroesophageal reflux disease)   . Hyperlipidemia     Current Outpatient Medications:  .  alendronate (FOSAMAX) 70 MG tablet, TAKE 1 TABLET EVERY 7 DAYS WITH A FULL GLASS OF WATER ON AN EMPTY STOMACH, Disp: 12 tablet, Rfl: 0 .  aspirin EC 81 MG tablet, Take 81 mg by mouth daily., Disp: , Rfl:  .  Biotin 10 MG TABS, Take 5,000 mg by mouth daily. , Disp: , Rfl:  .  Cholecalciferol (HM VITAMIN D3) 4000 units CAPS, Take 4,000 Int'l Units by mouth daily., Disp: , Rfl:  .  rosuvastatin (CRESTOR) 5 MG tablet, Take 1 tablet (5 mg total) by mouth daily., Disp: 90 tablet, Rfl: 3  Social History   Tobacco Use  Smoking Status Never Smoker  Smokeless Tobacco Never Used    Allergies  Allergen Reactions  . Penicillins Other (See Comments)    Stomach tightness.   Objective:  There were no vitals filed for this visit. There is no height or weight on file to calculate BMI. Constitutional Well developed. Well nourished.   Vascular Dorsalis pedis pulses palpable bilaterally. Posterior tibial pulses palpable bilaterally. Capillary refill normal to all digits.  No cyanosis or clubbing noted. Pedal hair growth normal.  Neurologic Normal speech. Oriented to person, place, and time. Epicritic sensation to light touch grossly present bilaterally.  Dermatologic Nails well groomed and normal in appearance. No open wounds. No skin lesions.  Orthopedic:  Pain on palpation to the right lateral foot at the level of the ATFL ligament.  No pain at the posterior tibial tendon, posterior tibial tendon, peroneal tendon, Achilles tendon.  Mild pain with dorsiflexion of the ankle joint.  Deep intra-articular pain noted.  Pain at the lateral gutter of the ankle joint.  No pain at the medial gutter.   Radiographs: 3 views of skeletally mature adult right ankle: Arthritic changes noted of the ankle mild to moderate in nature.  Previous hardware noted appears to be intact.  Mild loosening of the medial malleolus screw noted.  Again no protrusion noted.  Uneven joint space narrowing of the ankle joint noted.  Posterior heel spurring noted.  No other bony abnormalities identified Assessment:   1. Arthritis of right ankle   2. Capsulitis of foot, right    Plan:  Patient was evaluated and treated and all questions answered.  Right lateral ankle capsulitis/arthritis pain -I explained  to the patient the etiology of arthritis and various treatment options were discussed.  Given the location of the pain I believe patient will benefit from a steroid injection to help decrease acute inflammatory component associated pain.  Patient refuses to wear the boot therefore I believe patient will benefit from Tri-Lock ankle brace to give stability with ambulation.  If there is no improvement with the injection and Tri-Lock ankle brace we will plan on putting her in a cam boot.  Patient states understanding.  Also believe patient will benefit for the  steroid injection.  Steroid injection was given as directed. -A steroid injection was performed at right lateral foot using 1% plain Lidocaine and 10 mg of Kenalog. This was well tolerated.   No follow-ups on file.

## 2020-02-02 ENCOUNTER — Ambulatory Visit: Payer: Medicare Other | Admitting: Podiatry

## 2020-02-10 DIAGNOSIS — Z23 Encounter for immunization: Secondary | ICD-10-CM | POA: Diagnosis not present

## 2020-02-10 NOTE — Progress Notes (Addendum)
COVID Vaccine Completed: x2 Date COVID Vaccine completed:05-26-19 & 06-16-19 COVID vaccine manufacturer: [employer]   PCP - Clarene Reamer, FNP Cardiologist -   Chest x-ray -  EKG -  Stress Test -  ECHO -  Cardiac Cath -  Pacemaker/ICD device last checked:  Sleep Study -  CPAP -   Fasting Blood Sugar -  Checks Blood Sugar _____ times a day  Blood Thinner Instructions: Aspirin Instructions: Last Dose:  Anesthesia review:   Patient denies shortness of breath, fever, cough and chest pain at PAT appointment   Patient verbalized understanding of instructions that were given to them at the PAT appointment. Patient was also instructed that they will need to review over the PAT instructions again at home before surgery.

## 2020-02-10 NOTE — Patient Instructions (Addendum)
DUE TO COVID-19 ONLY ONE VISITOR IS ALLOWED TO COME WITH YOU AND STAY IN THE WAITING ROOM ONLY  DURING PRE OP AND PROCEDURE.   IF YOU WILL BE ADMITTED INTO THE HOSPITAL YOU ARE ALLOWED ONE SUPPORT PERSON DURING  VISITATION HOURS ONLY (10AM -8PM)   . The support person may change daily. . The support person must pass our screening, gel in and out, and wear a mask at all times, including in the patient's room. . Patients must also wear a mask when staff or their support person are in the room.   COVID SWAB TESTING MUST BE COMPLETED ON:  Thursday, 02-19-20 @ 10:30 AM   [address] [place], [place] [address]  (Must self quarantine after testing. Follow instructions on  handout.)    Your procedure is scheduled on: Monday, 02-23-20   Report to [facility] Main  Entrance   Report to admitting at 12:20 PM   Call this number if you have problems the morning of surgery [phone]   Do not eat food :After Midnight.   May have liquids until 11:50 AM day of surgery  CLEAR LIQUID DIET  Foods Allowed                                                                     Foods Excluded  Water, Black Coffee and tea, regular and decaf            liquids that you cannot  Plain Jell-O in any flavor  (No red)                                   see through such as: Fruit ices (not with fruit pulp)                                      milk, soups, orange juice              Iced Popsicles (No red)                                      All solid food                                   Apple juices Sports drinks like Gatorade (No red) Lightly seasoned clear broth or consume(fat free) Sugar, honey syrup     Complete one Ensure drink the morning of surgery at 11:50 AM  the day of surgery.     Oral Hygiene is also important to reduce your risk of infection.                                    Remember - BRUSH YOUR TEETH THE MORNING OF SURGERY WITH YOUR REGULAR TOOTHPASTE   Do NOT smoke after  Midnight   Take these medicines the morning of surgery with A  SIP OF WATER:  None                               You may not have any metal on your body including hair pins, jewelry, and body piercings             Do not wear make-up, lotions, powders, perfumes/cologne, or deodorant             Do not wear nail polish.  Do not shave  48 hours prior to surgery.     Do not bring valuables to the hospital. [place].   Contacts, dentures or bridgework may not be worn into surgery.   Bring small overnight bag day of surgery.                 Please read over the following fact sheets you were given: IF YOU HAVE QUESTIONS ABOUT YOUR PRE OP INSTRUCTIONS PLEASE CALL [phone]   [place] - Preparing for Surgery Before surgery, you can play an important role.  Because skin is not sterile, your skin needs to be as free of germs as possible.  You can reduce the number of germs on your skin by washing with CHG (chlorahexidine gluconate) soap before surgery.  CHG is an antiseptic cleaner which kills germs and bonds with the skin to continue killing germs even after washing. Please DO NOT use if you have an allergy to CHG or antibacterial soaps.  If your skin becomes reddened/irritated stop using the CHG and inform your nurse when you arrive at Short Stay. Do not shave (including legs and underarms) for at least 48 hours prior to the first CHG shower.  You may shave your face/neck.  Please follow these instructions carefully:  1.  Shower with CHG Soap the night before surgery and the  morning of surgery.  2.  If you choose to wash your hair, wash your hair first as usual with your normal  shampoo.  3.  After you shampoo, rinse your hair and body thoroughly to remove the shampoo.                             4.  Use CHG as you would any other liquid soap.  You can apply chg directly to the skin and wash.  Gently with a scrungie or clean washcloth.  5.  Apply the  CHG Soap to your body ONLY FROM THE NECK DOWN.   Do   not use on face/ open                           Wound or open sores. Avoid contact with eyes, ears mouth and   genitals (private parts).                       Wash face,  Genitals (private parts) with your normal soap.             6.  Wash thoroughly, paying special attention to the area where your    surgery  will be performed.  7.  Thoroughly rinse your body with warm water from the neck down.  8.  DO NOT shower/wash with your normal soap after using and rinsing off the CHG Soap.  9.  Pat yourself dry with a clean towel.            10.  Wear clean pajamas.            11.  Place clean sheets on your bed the night of your first shower and do not  sleep with pets. Day of Surgery : Do not apply any lotions/deodorants the morning of surgery.  Please wear clean clothes to the hospital/surgery center.  FAILURE TO FOLLOW THESE INSTRUCTIONS MAY RESULT IN THE CANCELLATION OF YOUR SURGERY  PATIENT SIGNATURE_________________________________  NURSE SIGNATURE__________________________________  ________________________________________________________________________   Sherry Lamb  An incentive spirometer is a tool that can help keep your lungs clear and active. This tool measures how well you are filling your lungs with each breath. Taking long deep breaths may help reverse or decrease the chance of developing breathing (pulmonary) problems (especially infection) following:  A long period of time when you are unable to move or be active. BEFORE THE PROCEDURE   If the spirometer includes an indicator to show your best effort, your nurse or respiratory therapist will set it to a desired goal.  If possible, sit up straight or lean slightly forward. Try not to slouch.  Hold the incentive spirometer in an upright position. INSTRUCTIONS FOR USE  1. Sit on the edge of your bed if possible, or sit up as far as you can in bed or  on a chair. 2. Hold the incentive spirometer in an upright position. 3. Breathe out normally. 4. Place the mouthpiece in your mouth and seal your lips tightly around it. 5. Breathe in slowly and as deeply as possible, raising the piston or the ball toward the top of the column. 6. Hold your breath for 3-5 seconds or for as long as possible. Allow the piston or ball to fall to the bottom of the column. 7. Remove the mouthpiece from your mouth and breathe out normally. 8. Rest for a few seconds and repeat Steps 1 through 7 at least 10 times every 1-2 hours when you are awake. Take your time and take a few normal breaths between deep breaths. 9. The spirometer may include an indicator to show your best effort. Use the indicator as a goal to work toward during each repetition. 10. After each set of 10 deep breaths, practice coughing to be sure your lungs are clear. If you have an incision (the cut made at the time of surgery), support your incision when coughing by placing a pillow or rolled up towels firmly against it. Once you are able to get out of bed, walk around indoors and cough well. You may stop using the incentive spirometer when instructed by your caregiver.  RISKS AND COMPLICATIONS  Take your time so you do not get dizzy or light-headed.  If you are in pain, you may need to take or ask for pain medication before doing incentive spirometry. It is harder to take a deep breath if you are having pain. AFTER USE  Rest and breathe slowly and easily.  It can be helpful to keep track of a log of your progress. Your caregiver can provide you with a simple table to help with this. If you are using the spirometer at home, follow these instructions: [place] IF:   You are having difficultly using the spirometer.  You have trouble using the spirometer as often as instructed.  Your pain medication is not giving enough relief while using the spirometer.  You  develop fever of 100.5 F  (38.1 C) or higher. SEEK IMMEDIATE MEDICAL CARE IF:   You cough up bloody sputum that had not been present before.  You develop fever of 102 F (38.9 C) or greater.  You develop worsening pain at or near the incision site. MAKE SURE YOU:   Understand these instructions.  Will watch your condition.  Will get help right away if you are not doing well or get worse. Document Released: 09/04/2006 Document Revised: 07/17/2011 Document Reviewed: 11/05/2006 ExitCare Patient Information 2014 ExitCare, [place].   ________________________________________________________________________  WHAT IS A BLOOD TRANSFUSION? Blood Transfusion Information  A transfusion is the replacement of blood or some of its parts. Blood is made up of multiple cells which provide different functions.  Red blood cells carry oxygen and are used for blood loss replacement.  White blood cells fight against infection.  Platelets control bleeding.  Plasma helps clot blood.  Other blood products are available for specialized needs, such as hemophilia or other clotting disorders. BEFORE THE TRANSFUSION  Who gives blood for transfusions?   Healthy volunteers who are fully evaluated to make sure their blood is safe. This is blood bank blood. Transfusion therapy is the safest it has ever been in the practice of medicine. Before blood is taken from a donor, a complete history is taken to make sure that person has no history of diseases nor engages in risky social behavior (examples are intravenous drug use or sexual activity with multiple partners). The donor's travel history is screened to minimize risk of transmitting infections, such as malaria. The donated blood is tested for signs of infectious diseases, such as HIV and hepatitis. The blood is then tested to be sure it is compatible with you in order to minimize the chance of a transfusion reaction. If you or a relative donates blood, this is often done in anticipation  of surgery and is not appropriate for emergency situations. It takes many days to process the donated blood. RISKS AND COMPLICATIONS Although transfusion therapy is very safe and saves many lives, the main dangers of transfusion include:   Getting an infectious disease.  Developing a transfusion reaction. This is an allergic reaction to something in the blood you were given. Every precaution is taken to prevent this. The decision to have a blood transfusion has been considered carefully by your caregiver before blood is given. Blood is not given unless the benefits outweigh the risks. AFTER THE TRANSFUSION  Right after receiving a blood transfusion, you will usually feel much better and more energetic. This is especially true if your red blood cells have gotten low (anemic). The transfusion raises the level of the red blood cells which carry oxygen, and this usually causes an energy increase.  The nurse administering the transfusion will monitor you carefully for complications. HOME CARE INSTRUCTIONS  No special instructions are needed after a transfusion. You may find your energy is better. Speak with your caregiver about any limitations on activity for underlying diseases you may have. SEEK MEDICAL CARE IF:   Your condition is not improving after your transfusion.  You develop redness or irritation at the intravenous (IV) site. SEEK IMMEDIATE MEDICAL CARE IF:  Any of the following symptoms occur over the next 12 hours:  Shaking chills.  You have a temperature by mouth above 102 F (38.9 C), not controlled by medicine.  Chest, back, or muscle pain.  People around you feel you are not acting correctly or are confused.  Shortness of  breath or difficulty breathing.  Dizziness and fainting.  You get a rash or develop hives.  You have a decrease in urine output.  Your urine turns a dark color or changes to pink, red, or brown. Any of the following symptoms occur over the next 10  days:  You have a temperature by mouth above 102 F (38.9 C), not controlled by medicine.  Shortness of breath.  Weakness after normal activity.  The white part of the eye turns yellow (jaundice).  You have a decrease in the amount of urine or are urinating less often.  Your urine turns a dark color or changes to pink, red, or brown. Document Released: 04/21/2000 Document Revised: 07/17/2011 Document Reviewed: 12/09/2007 [facility] Patient Information 2014 [place], [place].  _______________________________________________________________________

## 2020-02-13 ENCOUNTER — Other Ambulatory Visit: Payer: Self-pay

## 2020-02-13 ENCOUNTER — Encounter (HOSPITAL_COMMUNITY)
Admission: RE | Admit: 2020-02-13 | Discharge: 2020-02-13 | Disposition: A | Discharge status: Home / Self Care | Payer: Medicare Other | Source: Ambulatory Visit | Attending: Orthopedic Surgery | Admitting: Orthopedic Surgery

## 2020-02-13 ENCOUNTER — Encounter (HOSPITAL_COMMUNITY): Payer: Self-pay

## 2020-02-13 DIAGNOSIS — Z01812 Encounter for preprocedural laboratory examination: Secondary | ICD-10-CM | POA: Insufficient documentation

## 2020-02-13 HISTORY — DX: Nausea with vomiting, unspecified: R11.2

## 2020-02-13 HISTORY — DX: Pneumonia, unspecified organism: J18.9

## 2020-02-13 HISTORY — DX: Other specified postprocedural states: Z98.890

## 2020-02-13 LAB — CBC
HCT: 43 % (ref 36.0–46.0)
Hemoglobin: 14.4 g/dL (ref 12.0–15.0)
MCH: 32.1 pg (ref 26.0–34.0)
MCHC: 33.5 g/dL (ref 30.0–36.0)
MCV: 96 fL (ref 80.0–100.0)
Platelets: 219 10*3/uL (ref 150–400)
RBC: 4.48 MIL/uL (ref 3.87–5.11)
RDW: 13.8 % (ref 11.5–15.5)
WBC: 6 10*3/uL (ref 4.0–10.5)
nRBC: 0 % (ref 0.0–0.2)

## 2020-02-13 LAB — COMPREHENSIVE METABOLIC PANEL
ALT: 19 U/L (ref 0–44)
AST: 26 U/L (ref 15–41)
Albumin: 4.1 g/dL (ref 3.5–5.0)
Alkaline Phosphatase: 47 U/L (ref 38–126)
Anion gap: 12 (ref 5–15)
BUN: 29 mg/dL — ABNORMAL HIGH (ref 8–23)
CO2: 27 mmol/L (ref 22–32)
Calcium: 9.4 mg/dL (ref 8.9–10.3)
Chloride: 102 mmol/L (ref 98–111)
Creatinine, Ser: 0.49 mg/dL (ref 0.44–1.00)
GFR, Estimated: >60 mL/min (ref >60)
Glucose, Bld: 102 mg/dL — ABNORMAL HIGH (ref 70–99)
Potassium: 3.9 mmol/L (ref 3.5–5.1)
Sodium: 141 mmol/L (ref 135–145)
Total Bilirubin: 0.9 mg/dL (ref 0.3–1.2)
Total Protein: 7.4 g/dL (ref 6.5–8.1)

## 2020-02-13 LAB — PROTIME-INR
INR: 1 (ref 0.8–1.2)
Prothrombin Time: 12.4 seconds (ref 11.4–15.2)

## 2020-02-13 LAB — SURGICAL PCR SCREEN
MRSA, PCR: NEGATIVE
Staphylococcus aureus: NEGATIVE

## 2020-02-13 LAB — APTT: aPTT: 27 seconds (ref 24–36)

## 2020-02-16 ENCOUNTER — Telehealth: Payer: Self-pay

## 2020-02-16 ENCOUNTER — Encounter: Payer: Self-pay | Admitting: Family Medicine

## 2020-02-16 ENCOUNTER — Other Ambulatory Visit: Payer: Self-pay

## 2020-02-16 ENCOUNTER — Ambulatory Visit (INDEPENDENT_AMBULATORY_CARE_PROVIDER_SITE_OTHER): Payer: Medicare Other | Admitting: Family Medicine

## 2020-02-16 VITALS — BP 148/86 | HR 84 | Temp 97.0°F | Ht 64.0 in | Wt 149.8 lb

## 2020-02-16 DIAGNOSIS — Z01818 Encounter for other preprocedural examination: Secondary | ICD-10-CM

## 2020-02-16 NOTE — Telephone Encounter (Signed)
Pt called to check status of paper work.  I explained you have not had a chance to send it to Dr. Taffy Ralphs yet due to taking care of patients but that you would take care of it before end of day.  She says she's going to dinner and once you fax you can leave a v/m on her machine.  Thank you!

## 2020-02-16 NOTE — Progress Notes (Signed)
   Subjective:    Patient ID: Sherry Lamb, female    DOB: 02/22/1935, 85 y.o.   MRN: [id number]  HPI Chief Complaint  Patient presents with  . surgical clearance    right knee replacement 02/23/2020   This is an 85 yo female who presents today for above cc. She is scheduled for right total knee replacement on 02/23/2020. She has already had preoperative labs done. She has recently been on prednisone for knee. Reports that it caused GI upset.    Review of Systems Denies change in headache, visual changes, chest pain, SOB, cough, wheeze, DOE, leg swelling    Objective:   Physical Exam Physical Exam  Constitutional: Oriented to person, place, and time. Appears well-developed and well-nourished.  HENT:  Head: Normocephalic and atraumatic.  Eyes: Conjunctivae are normal.  Neck: Normal range of motion. Neck supple.  Cardiovascular: Normal rate, regular rhythm and normal heart sounds.   Pulmonary/Chest: Effort normal and breath sounds normal.  Musculoskeletal: No lower extremity edema.   Neurological: Alert and oriented to person, place, and time.  Skin: Skin is warm and dry.  Psychiatric: Normal mood and affect. Behavior is normal. Judgment and thought content normal.  Vitals reviewed.     BP (!) 148/86   Pulse 84   Temp (!) 97 F (36.1 C) (Temporal)   Ht 5\' 4"  (1.626 m)   Wt 149 lb 12 oz (67.9 kg)   SpO2 96%   BMI 25.70 kg/m  Wt Readings from Last 3 Encounters:  02/16/20 149 lb 12 oz (67.9 kg)  02/13/20 151 lb 4 oz (68.6 kg)  10/27/19 154 lb 12.8 oz (70.2 kg)   EKG- sinus rhythm, rate 81, PR 122, QT- 392, occasional ectopic beat. No available EKGs for comparison.     Assessment & Plan:  1. Preoperative clearance - Cleared for surgery, letter, labs, ekg sent to [facility] - EKG 12-Lead  This visit occurred during the SARS-CoV-2 public health emergency.  Safety protocols were in place, including screening questions prior to the visit, additional usage of staff PPE,  and extensive cleaning of exam room while observing appropriate contact time as indicated for disinfecting solutions.    Clarene Reamer, FNP-BC  [facility], [facility]  02/16/2020 12:11 PM

## 2020-02-16 NOTE — Telephone Encounter (Signed)
Pt called to let you know that she did not have PPW from [facility] for surgical clearance.... She stated she tried to reach out to their office with no luck... fax number given to pt to give to their office if she is able to get through

## 2020-02-16 NOTE — Telephone Encounter (Signed)
Called and left patient a message to let her know that we have faxed her paperwork to Dr. Wynelle Link.  Verified with CMA that information went to [facility], Dr. Gaynelle Arabian.

## 2020-02-16 NOTE — Telephone Encounter (Signed)
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

## 2020-02-16 NOTE — Telephone Encounter (Signed)
Pt request cb when surgical clearance letter is faxed to Dr Vondell Ralphs.

## 2020-02-16 NOTE — Telephone Encounter (Signed)
I have placed surgical clearance letter and EKG on your desk.  Please attach most recent labs and fax to Dr. Peri Maris office at [facility].

## 2020-02-16 NOTE — Patient Instructions (Signed)
Good to see you today  Please see if you have a preoperative clearance form at home that needs to be completed by me- just drop off.   If you do not have a form at home, please call and let me know so I can get in touch with Dr. Peri Maris office to get the required form.

## 2020-02-19 ENCOUNTER — Other Ambulatory Visit
Admission: RE | Admit: 2020-02-19 | Discharge: 2020-02-19 | Disposition: A | Discharge status: Home / Self Care | Payer: Medicare Other | Source: Ambulatory Visit | Attending: Orthopedic Surgery | Admitting: Orthopedic Surgery

## 2020-02-19 ENCOUNTER — Other Ambulatory Visit (HOSPITAL_COMMUNITY): Payer: Medicare Other

## 2020-02-19 ENCOUNTER — Other Ambulatory Visit: Payer: Self-pay

## 2020-02-19 DIAGNOSIS — Z01812 Encounter for preprocedural laboratory examination: Secondary | ICD-10-CM | POA: Diagnosis not present

## 2020-02-19 DIAGNOSIS — Z20822 Contact with and (suspected) exposure to covid-19: Secondary | ICD-10-CM | POA: Insufficient documentation

## 2020-02-19 LAB — SARS CORONAVIRUS 2 (TAT 6-24 HRS): SARS Coronavirus 2: NEGATIVE

## 2020-02-22 MED ORDER — BUPIVACAINE LIPOSOME 1.3 % IJ SUSP
20.0000 mL | Freq: Once | INTRAMUSCULAR | Status: AC
  Filled 2020-02-22: qty 20

## 2020-02-22 NOTE — H&P (Signed)
TOTAL KNEE ADMISSION H&P  Patient is being admitted for right total knee arthroplasty.  Subjective:  Chief Complaint:right knee pain.  HPI: Sherry Lamb, 85 y.o. female, has a history of pain and functional disability in the right knee due to arthritis and has failed non-surgical conservative treatments for greater than 12 weeks to includeactivity modification.  Onset of symptoms was gradual, starting 3 years ago with gradually worsening course since that time. The patient noted no past surgery on the right knee(s).  Patient currently rates pain in the right knee(s) at 8 out of 10 with activity. Patient has worsening of pain with activity and weight bearing and pain that interferes with activities of daily living.  Patient has evidence of joint space narrowing by imaging studies. There is no active infection.  Patient Active Problem List   Diagnosis Date Noted  . Lumbar radiculopathy 09/27/2018  . Osteoarthritis of knee 08/26/2018  . Degeneration of lumbar intervertebral disc 08/20/2018  . Bilateral chronic knee pain 06/06/2018  . Hyperlipidemia   . GERD (gastroesophageal reflux disease)   . Frequent headaches   . Arthritis    Past Medical History:  Diagnosis Date  . Arthritis   . Frequent headaches   . GERD (gastroesophageal reflux disease)   . Hyperlipidemia   . Pneumonia   . PONV (postoperative nausea and vomiting)     Past Surgical History:  Procedure Laterality Date  . ABDOMINAL HYSTERECTOMY    . ANKLE SURGERY    . EYE SURGERY    . Left hip replacement Left 2013  . right hip replacement Right 2000  . TONSILLECTOMY AND ADENOIDECTOMY  1943  . WISDOM TOOTH EXTRACTION      Current Facility-Administered Medications  Medication Dose Route Frequency Provider Last Rate Last Admin  . [START ON 02/23/2020] bupivacaine liposome (EXPAREL) 1.3 % injection 266 mg  20 mL Other Once Gaynelle Arabian, MD       Current Outpatient Medications  Medication Sig Dispense Refill Last Dose    . alendronate (FOSAMAX) 70 MG tablet TAKE 1 TABLET EVERY 7 DAYS WITH A FULL GLASS OF WATER ON AN EMPTY STOMACH (Patient taking differently: Take 70 mg by mouth every Saturday. ) 12 tablet 0   . aspirin EC 81 MG tablet Take 81 mg by mouth 2 (two) times a week.      . Biotin (BIOTIN 5000) 5 MG CAPS Take 5 mg by mouth daily.     . Cholecalciferol (VITAMIN D) 50 MCG (2000 UT) CAPS Take 4,000 Units by mouth daily.     Marland Kitchen Histamine Dihydrochloride (AUSTRALIAN DREAM ARTHRITIS EX) Apply 1 application topically daily as needed (pain).     . naproxen sodium (ALEVE) 220 MG tablet Take 220-440 mg by mouth 2 (two) times daily as needed (pain/migraines).     . Polyvinyl Alcohol-Povidone (REFRESH OP) Place 1 drop into both eyes 2 (two) times daily.     . rosuvastatin (CRESTOR) 5 MG tablet Take 1 tablet (5 mg total) by mouth daily. 90 tablet 3    Allergies  Allergen Reactions  . Penicillins Other (See Comments)    Stomach tightness.  . Prednisone Other (See Comments)    GI intolerance    Social History   Tobacco Use  . Smoking status: Never Smoker  . Smokeless tobacco: Never Used  Substance Use Topics  . Alcohol use: Yes    Alcohol/week: 1.0 standard drink    Types: 1 Glasses of wine per week    Comment: 1 wine day  Family History  Problem Relation Age of Onset  . Arthritis Mother   . Asthma Mother   . Hearing loss Mother   . Early death Father   . Heart disease Father   . Hyperlipidemia Father   . Kidney disease Father   . Depression Brother   . Hyperlipidemia Brother   . Breast cancer Neg Hx      Review of Systems  Constitutional: Negative for chills and fever.  Respiratory: Negative for cough and shortness of breath.   Cardiovascular: Negative for chest pain.  Gastrointestinal: Negative for nausea and vomiting.  Musculoskeletal: Positive for arthralgias.    Objective:  Physical Exam Patient is an 85 year old female.  Well nourished and well developed. General: Alert and  oriented x3, cooperative and pleasant, no acute distress. Head: normocephalic, atraumatic, neck supple. Eyes: EOMI. Respiratory: breath sounds clear in all fields, no wheezing, rales, or rhonchi. Cardiovascular: Regular rate and rhythm, no murmurs, gallops or rubs. Abdomen: non-tender to palpation and soft, normoactive bowel sounds.  Musculoskeletal: Right Knee Exam: Valgus deformity. No effusion present. No swelling present. The range of motion is: 5 to 120 degrees. Marked crepitus on range of motion of the knee. Positive lateral greater than medial joint line tenderness. The knee is stable.  Calves soft and nontender. Motor function intact in LE. Strength 5/5 LE bilaterally. Neuro: Distal pulses 2+. Sensation to light touch intact in LE.  Vital signs in last 24 hours:    Labs:   Estimated body mass index is 25.7 kg/m as calculated from the following:   Height as of 02/16/20: 5\' 4"  (1.626 m).   Weight as of 02/16/20: 67.9 kg.   Imaging Review Plain radiographs demonstrate severe degenerative joint disease of the right knee(s). The overall alignment isneutral. The bone quality appears to be adequate for age and reported activity level.      Assessment/Plan:  End stage arthritis, right knee   The patient history, physical examination, clinical judgment of the provider and imaging studies are consistent with end stage degenerative joint disease of the right knee(s) and total knee arthroplasty is deemed medically necessary. The treatment options including medical management, injection therapy arthroscopy and arthroplasty were discussed at length. The risks and benefits of total knee arthroplasty were presented and reviewed. The risks due to aseptic loosening, infection, stiffness, patella tracking problems, thromboembolic complications and other imponderables were discussed. The patient acknowledged the explanation, agreed to proceed with the plan and consent was signed.  Patient is being admitted for inpatient treatment for surgery, pain control, PT, OT, prophylactic antibiotics, VTE prophylaxis, progressive ambulation and ADL's and discharge planning. The patient is planning to be discharged home.  Therapy Plans: outpatient therapy at [facility] Disposition: Home with spouse Planned DVT Prophylaxis: aspirin 325mg  BID DME needed: none PCP: Tor Netters, FNP, appointment scheduled TXA: IV Allergies: PCN G - childhood reaction, codeine - nausea/vomiting Anesthesia Concerns: none BMI: 27.6 Not diabetic.  Other:  -Prefers SDD.  -Dilaudid for pain, nausea with narcotics.  -Had an issue with abdominal suture spitting out after hysterectomy resulting in re-opening the incision.  -Hx of ankle fracture 7-8 years ago, wearing brace currently. Careful positioning intra-op with the ankle.   Patient's anticipated LOS is less than 2 midnights, meeting these requirements: - Younger than 45 - Lives within 1 hour of care - Has a competent adult at home to recover with post-op recover - NO history of  - Chronic pain requiring opiods  - Diabetes  - Coronary  Artery Disease  - Heart failure  - Heart attack  - Stroke  - DVT/VTE  - Cardiac arrhythmia  - Respiratory Failure/COPD  - Renal failure  - Anemia  - Advanced Liver disease   - Patient was instructed on what medications to stop prior to surgery. - Follow-up visit in 2 weeks with Dr. Wynelle Link - Begin physical therapy following surgery - Pre-operative lab work as pre-surgical testing - Prescriptions will be provided in hospital at time of discharge  Griffith Citron, PA-C Orthopedic Surgery [facility] [place] [phone]

## 2020-02-23 ENCOUNTER — Ambulatory Visit (HOSPITAL_COMMUNITY): Payer: Medicare Other | Admitting: Anesthesiology

## 2020-02-23 ENCOUNTER — Other Ambulatory Visit: Payer: Self-pay

## 2020-02-23 ENCOUNTER — Encounter (HOSPITAL_COMMUNITY): Payer: Self-pay | Admitting: Orthopedic Surgery

## 2020-02-23 ENCOUNTER — Encounter (HOSPITAL_COMMUNITY)
Admission: RE | Disposition: A | Discharge status: Home / Self Care | Payer: Self-pay | Source: Home / Self Care | Attending: Orthopedic Surgery

## 2020-02-23 ENCOUNTER — Observation Stay (HOSPITAL_COMMUNITY)
Admission: RE | Admit: 2020-02-23 | Discharge: 2020-02-24 | Disposition: A | Discharge status: Home / Self Care | Payer: Medicare Other | Attending: Orthopedic Surgery | Admitting: Orthopedic Surgery

## 2020-02-23 DIAGNOSIS — Z96643 Presence of artificial hip joint, bilateral: Secondary | ICD-10-CM | POA: Insufficient documentation

## 2020-02-23 DIAGNOSIS — M1710 Unilateral primary osteoarthritis, unspecified knee: Secondary | ICD-10-CM

## 2020-02-23 DIAGNOSIS — M1711 Unilateral primary osteoarthritis, right knee: Secondary | ICD-10-CM | POA: Insufficient documentation

## 2020-02-23 DIAGNOSIS — Z7982 Long term (current) use of aspirin: Secondary | ICD-10-CM | POA: Diagnosis not present

## 2020-02-23 DIAGNOSIS — E785 Hyperlipidemia, unspecified: Secondary | ICD-10-CM | POA: Diagnosis not present

## 2020-02-23 DIAGNOSIS — K219 Gastro-esophageal reflux disease without esophagitis: Secondary | ICD-10-CM | POA: Diagnosis not present

## 2020-02-23 DIAGNOSIS — G8918 Other acute postprocedural pain: Secondary | ICD-10-CM | POA: Diagnosis not present

## 2020-02-23 DIAGNOSIS — M179 Osteoarthritis of knee, unspecified: Secondary | ICD-10-CM | POA: Diagnosis present

## 2020-02-23 DIAGNOSIS — M25561 Pain in right knee: Secondary | ICD-10-CM | POA: Diagnosis present

## 2020-02-23 HISTORY — PX: TOTAL KNEE ARTHROPLASTY: SHX125

## 2020-02-23 LAB — TYPE AND SCREEN
ABO/RH(D): O POS
Antibody Screen: NEGATIVE

## 2020-02-23 LAB — ABO/RH: ABO/RH(D): O POS

## 2020-02-23 SURGERY — ARTHROPLASTY, KNEE, TOTAL
Anesthesia: General | Site: Knee | Laterality: Right

## 2020-02-23 MED ORDER — MORPHINE SULFATE (PF) 2 MG/ML IV SOLN: 0.5000 mg | INTRAVENOUS | Status: DC | PRN

## 2020-02-23 MED ORDER — CEFAZOLIN SODIUM-DEXTROSE 2-4 GM/100ML-% IV SOLN
2.0000 g | INTRAVENOUS | Status: AC
  Administered 2020-02-23: 2 g via INTRAVENOUS
  Filled 2020-02-23: qty 100

## 2020-02-23 MED ORDER — METOCLOPRAMIDE HCL 5 MG/ML IJ SOLN: 5.0000 mg | Freq: Three times a day (TID) | INTRAMUSCULAR | Status: DC | PRN

## 2020-02-23 MED ORDER — METHOCARBAMOL 500 MG PO TABS
500.0000 mg | ORAL_TABLET | Freq: Four times a day (QID) | ORAL | Status: DC | PRN
  Administered 2020-02-24: 500 mg via ORAL
  Filled 2020-02-23 (×3): qty 1

## 2020-02-23 MED ORDER — ONDANSETRON HCL 4 MG/2ML IJ SOLN
INTRAMUSCULAR | Status: DC | PRN
  Administered 2020-02-23: 4 mg via INTRAVENOUS

## 2020-02-23 MED ORDER — PROPOFOL 500 MG/50ML IV EMUL
INTRAVENOUS | Status: DC | PRN
  Administered 2020-02-23: 30 mg via INTRAVENOUS
  Administered 2020-02-23: 140 mg via INTRAVENOUS

## 2020-02-23 MED ORDER — FENTANYL CITRATE (PF) 100 MCG/2ML IJ SOLN
INTRAMUSCULAR | Status: DC | PRN
Start: 2020-02-23 — End: 2020-02-23
  Administered 2020-02-23: 100 ug via INTRAVENOUS
  Administered 2020-02-23 (×2): 50 ug via INTRAVENOUS

## 2020-02-23 MED ORDER — SODIUM CHLORIDE 0.9 % IV SOLN: INTRAVENOUS | Status: DC

## 2020-02-23 MED ORDER — HYDROMORPHONE HCL 2 MG PO TABS: 2.0000 mg | ORAL_TABLET | ORAL | Status: DC | PRN

## 2020-02-23 MED ORDER — STERILE WATER FOR IRRIGATION IR SOLN
Status: DC | PRN
  Administered 2020-02-23: 1000 mL

## 2020-02-23 MED ORDER — ROPIVACAINE HCL 7.5 MG/ML IJ SOLN
INTRAMUSCULAR | Status: DC | PRN
  Administered 2020-02-23: 20 mL via PERINEURAL

## 2020-02-23 MED ORDER — SODIUM CHLORIDE (PF) 0.9 % IJ SOLN
INTRAMUSCULAR | Status: AC
  Filled 2020-02-23: qty 50

## 2020-02-23 MED ORDER — CHLORHEXIDINE GLUCONATE 0.12 % MT SOLN
15.0000 mL | Freq: Once | OROMUCOSAL | Status: AC
  Administered 2020-02-23: 15 mL via OROMUCOSAL

## 2020-02-23 MED ORDER — METHOCARBAMOL 500 MG IVPB - SIMPLE MED
500.0000 mg | Freq: Four times a day (QID) | INTRAVENOUS | Status: DC | PRN
  Filled 2020-02-23: qty 50

## 2020-02-23 MED ORDER — PHENOL 1.4 % MT LIQD: 1.0000 | OROMUCOSAL | Status: DC | PRN

## 2020-02-23 MED ORDER — DOCUSATE SODIUM 100 MG PO CAPS
100.0000 mg | ORAL_CAPSULE | Freq: Two times a day (BID) | ORAL | Status: DC
  Administered 2020-02-23 – 2020-02-24 (×2): 100 mg via ORAL
  Filled 2020-02-23 (×2): qty 1

## 2020-02-23 MED ORDER — METHOCARBAMOL 500 MG IVPB - SIMPLE MED
INTRAVENOUS | Status: AC
  Administered 2020-02-23: 500 mg via INTRAVENOUS
  Filled 2020-02-23: qty 50

## 2020-02-23 MED ORDER — ACETAMINOPHEN 10 MG/ML IV SOLN
1000.0000 mg | Freq: Once | INTRAVENOUS | Status: AC
  Administered 2020-02-23: 1000 mg via INTRAVENOUS
  Filled 2020-02-23: qty 100

## 2020-02-23 MED ORDER — CLONIDINE HCL (ANALGESIA) 100 MCG/ML EP SOLN
EPIDURAL | Status: DC | PRN
  Administered 2020-02-23: 50 ug

## 2020-02-23 MED ORDER — ASPIRIN EC 325 MG PO TBEC
325.0000 mg | DELAYED_RELEASE_TABLET | Freq: Two times a day (BID) | ORAL | Status: DC
  Administered 2020-02-24: 325 mg via ORAL
  Filled 2020-02-23: qty 1

## 2020-02-23 MED ORDER — FLEET ENEMA 7-19 GM/118ML RE ENEM: 1.0000 | ENEMA | Freq: Once | RECTAL | Status: DC | PRN

## 2020-02-23 MED ORDER — SODIUM CHLORIDE (PF) 0.9 % IJ SOLN
INTRAMUSCULAR | Status: DC | PRN
  Administered 2020-02-23: 60 mL

## 2020-02-23 MED ORDER — TRAMADOL HCL 50 MG PO TABS
50.0000 mg | ORAL_TABLET | Freq: Four times a day (QID) | ORAL | Status: DC | PRN
  Administered 2020-02-23 – 2020-02-24 (×3): 100 mg via ORAL
  Filled 2020-02-23: qty 2
  Filled 2020-02-23 (×2): qty 1
  Filled 2020-02-23: qty 2

## 2020-02-23 MED ORDER — METOCLOPRAMIDE HCL 5 MG PO TABS: 5.0000 mg | ORAL_TABLET | Freq: Three times a day (TID) | ORAL | Status: DC | PRN

## 2020-02-23 MED ORDER — CEFAZOLIN SODIUM-DEXTROSE 2-4 GM/100ML-% IV SOLN
2.0000 g | Freq: Four times a day (QID) | INTRAVENOUS | Status: AC
  Administered 2020-02-23 – 2020-02-24 (×2): 2 g via INTRAVENOUS
  Filled 2020-02-23 (×2): qty 100

## 2020-02-23 MED ORDER — ORAL CARE MOUTH RINSE: 15.0000 mL | Freq: Once | OROMUCOSAL | Status: AC

## 2020-02-23 MED ORDER — HYDROMORPHONE HCL 1 MG/ML IJ SOLN
INTRAMUSCULAR | Status: AC
  Administered 2020-02-23: 0.5 mg via INTRAVENOUS
  Filled 2020-02-23: qty 1

## 2020-02-23 MED ORDER — POVIDONE-IODINE 10 % EX SWAB
2.0000 "application " | Freq: Once | CUTANEOUS | Status: AC
  Administered 2020-02-23: 2 via TOPICAL

## 2020-02-23 MED ORDER — DEXAMETHASONE SODIUM PHOSPHATE 10 MG/ML IJ SOLN
8.0000 mg | Freq: Once | INTRAMUSCULAR | Status: AC
  Administered 2020-02-23: 10 mg via INTRAVENOUS

## 2020-02-23 MED ORDER — PHENYLEPHRINE HCL (PRESSORS) 10 MG/ML IV SOLN
INTRAVENOUS | Status: DC | PRN
  Administered 2020-02-23: 120 ug via INTRAVENOUS
  Administered 2020-02-23: 80 ug via INTRAVENOUS
  Administered 2020-02-23: 120 ug via INTRAVENOUS
  Administered 2020-02-23 (×2): 80 ug via INTRAVENOUS

## 2020-02-23 MED ORDER — ONDANSETRON HCL 4 MG/2ML IJ SOLN: 4.0000 mg | Freq: Once | INTRAMUSCULAR | Status: DC | PRN

## 2020-02-23 MED ORDER — LACTATED RINGERS IV SOLN: INTRAVENOUS | Status: DC

## 2020-02-23 MED ORDER — MENTHOL 3 MG MT LOZG: 1.0000 | LOZENGE | OROMUCOSAL | Status: DC | PRN

## 2020-02-23 MED ORDER — SODIUM CHLORIDE (PF) 0.9 % IJ SOLN
INTRAMUSCULAR | Status: AC
  Filled 2020-02-23: qty 10

## 2020-02-23 MED ORDER — ONDANSETRON HCL 4 MG PO TABS: 4.0000 mg | ORAL_TABLET | Freq: Four times a day (QID) | ORAL | Status: DC | PRN

## 2020-02-23 MED ORDER — FENTANYL CITRATE (PF) 100 MCG/2ML IJ SOLN
INTRAMUSCULAR | Status: AC
  Administered 2020-02-23: 50 ug via INTRAVENOUS
  Filled 2020-02-23: qty 2

## 2020-02-23 MED ORDER — FENTANYL CITRATE (PF) 100 MCG/2ML IJ SOLN
50.0000 ug | INTRAMUSCULAR | Status: DC
  Administered 2020-02-23: 50 ug via INTRAVENOUS
  Filled 2020-02-23: qty 2

## 2020-02-23 MED ORDER — DIPHENHYDRAMINE HCL 12.5 MG/5ML PO ELIX: 12.5000 mg | ORAL_SOLUTION | ORAL | Status: DC | PRN

## 2020-02-23 MED ORDER — BISACODYL 10 MG RE SUPP: 10.0000 mg | Freq: Every day | RECTAL | Status: DC | PRN

## 2020-02-23 MED ORDER — PHENYLEPHRINE 40 MCG/ML (10ML) SYRINGE FOR IV PUSH (FOR BLOOD PRESSURE SUPPORT)
PREFILLED_SYRINGE | INTRAVENOUS | Status: AC
  Filled 2020-02-23: qty 10

## 2020-02-23 MED ORDER — HYDROMORPHONE HCL 1 MG/ML IJ SOLN
0.2500 mg | INTRAMUSCULAR | Status: DC | PRN
  Administered 2020-02-23: 0.5 mg via INTRAVENOUS

## 2020-02-23 MED ORDER — TRANEXAMIC ACID-NACL 1000-0.7 MG/100ML-% IV SOLN
1000.0000 mg | INTRAVENOUS | Status: AC
  Administered 2020-02-23: 1000 mg via INTRAVENOUS
  Filled 2020-02-23: qty 100

## 2020-02-23 MED ORDER — FENTANYL CITRATE (PF) 100 MCG/2ML IJ SOLN
25.0000 ug | INTRAMUSCULAR | Status: DC | PRN
  Administered 2020-02-23: 50 ug via INTRAVENOUS

## 2020-02-23 MED ORDER — LIDOCAINE HCL (CARDIAC) PF 100 MG/5ML IV SOSY
PREFILLED_SYRINGE | INTRAVENOUS | Status: DC | PRN
  Administered 2020-02-23: 3 mL via INTRATRACHEAL

## 2020-02-23 MED ORDER — POLYETHYLENE GLYCOL 3350 17 G PO PACK: 17.0000 g | PACK | Freq: Every day | ORAL | Status: DC | PRN

## 2020-02-23 MED ORDER — ACETAMINOPHEN 325 MG PO TABS
325.0000 mg | ORAL_TABLET | Freq: Four times a day (QID) | ORAL | Status: DC | PRN
  Filled 2020-02-23: qty 2

## 2020-02-23 MED ORDER — FENTANYL CITRATE (PF) 100 MCG/2ML IJ SOLN
INTRAMUSCULAR | Status: AC
  Filled 2020-02-23: qty 2

## 2020-02-23 MED ORDER — 0.9 % SODIUM CHLORIDE (POUR BTL) OPTIME
TOPICAL | Status: DC | PRN
  Administered 2020-02-23: 1000 mL

## 2020-02-23 MED ORDER — EPHEDRINE SULFATE 50 MG/ML IJ SOLN
INTRAMUSCULAR | Status: DC | PRN
  Administered 2020-02-23 (×2): 10 mg via INTRAVENOUS

## 2020-02-23 MED ORDER — SODIUM CHLORIDE 0.9 % IR SOLN
Status: DC | PRN
  Administered 2020-02-23: 1000 mL

## 2020-02-23 MED ORDER — BUPIVACAINE LIPOSOME 1.3 % IJ SUSP
INTRAMUSCULAR | Status: DC | PRN
  Administered 2020-02-23: 20 mL

## 2020-02-23 MED ORDER — ONDANSETRON HCL 4 MG/2ML IJ SOLN
4.0000 mg | Freq: Four times a day (QID) | INTRAMUSCULAR | Status: DC | PRN
  Administered 2020-02-23: 4 mg via INTRAVENOUS
  Filled 2020-02-23: qty 2

## 2020-02-23 MED ORDER — ROSUVASTATIN CALCIUM 5 MG PO TABS
5.0000 mg | ORAL_TABLET | Freq: Every day | ORAL | Status: DC
  Administered 2020-02-23: 5 mg via ORAL
  Filled 2020-02-23 (×2): qty 1

## 2020-02-23 SURGICAL SUPPLY — 56 items
BAG ZIPLOCK 12X15 (MISCELLANEOUS) ×3 IMPLANT
BLADE SAG 18X100X1.27 (BLADE) ×3 IMPLANT
BLADE SAW SGTL 11.0X1.19X90.0M (BLADE) ×3 IMPLANT
BLADE SURG SZ10 CARB STEEL (BLADE) ×6 IMPLANT
BNDG ELASTIC 6X10 VLCR STRL LF (GAUZE/BANDAGES/DRESSINGS) ×3 IMPLANT
BNDG ELASTIC 6X5.8 VLCR STR LF (GAUZE/BANDAGES/DRESSINGS) ×3 IMPLANT
BOWL SMART MIX CTS (DISPOSABLE) ×3 IMPLANT
CEMENT HV SMART SET (Cement) ×6 IMPLANT
CEMENT TIBIA MBT (Knees) ×1 IMPLANT
CLOSURE STERI-STRIP 1/2X4 (GAUZE/BANDAGES/DRESSINGS) ×1
CLOSURE WOUND 1/2 X4 (GAUZE/BANDAGES/DRESSINGS) ×2
CLSR STERI-STRIP ANTIMIC 1/2X4 (GAUZE/BANDAGES/DRESSINGS) ×2 IMPLANT
COVER SURGICAL LIGHT HANDLE (MISCELLANEOUS) ×3 IMPLANT
COVER WAND RF STERILE (DRAPES) IMPLANT
CUFF TOURN SGL QUICK 34 (TOURNIQUET CUFF) ×2
CUFF TRNQT CYL 34X4.125X (TOURNIQUET CUFF) ×1 IMPLANT
DECANTER SPIKE VIAL GLASS SM (MISCELLANEOUS) ×3 IMPLANT
DRAPE U-SHAPE 47X51 STRL (DRAPES) ×3 IMPLANT
DRSG AQUACEL AG ADV 3.5X10 (GAUZE/BANDAGES/DRESSINGS) ×3 IMPLANT
DURAPREP 26ML APPLICATOR (WOUND CARE) ×3 IMPLANT
ELECT REM PT RETURN 15FT ADLT (MISCELLANEOUS) ×3 IMPLANT
FEMUR SIGMA PS SZ 3.0 R (Femur) ×3 IMPLANT
GLOVE BIO SURGEON STRL SZ7 (GLOVE) ×3 IMPLANT
GLOVE BIO SURGEON STRL SZ8 (GLOVE) ×3 IMPLANT
GLOVE BIOGEL PI IND STRL 7.0 (GLOVE) ×1 IMPLANT
GLOVE BIOGEL PI IND STRL 8 (GLOVE) ×1 IMPLANT
GLOVE BIOGEL PI INDICATOR 7.0 (GLOVE) ×2
GLOVE BIOGEL PI INDICATOR 8 (GLOVE) ×2
GOWN STRL REUS W/TWL LRG LVL3 (GOWN DISPOSABLE) ×6 IMPLANT
HANDPIECE INTERPULSE COAX TIP (DISPOSABLE) ×2
HOLDER FOLEY CATH W/STRAP (MISCELLANEOUS) IMPLANT
IMMOBILIZER KNEE 20 (SOFTGOODS) ×3
IMMOBILIZER KNEE 20 THIGH 36 (SOFTGOODS) ×1 IMPLANT
INSERT TIBIAL PFC SIG SZ3 10MM (Knees) ×3 IMPLANT
KIT TURNOVER KIT A (KITS) IMPLANT
MANIFOLD NEPTUNE II (INSTRUMENTS) ×3 IMPLANT
NS IRRIG 1000ML POUR BTL (IV SOLUTION) ×3 IMPLANT
PACK TOTAL KNEE CUSTOM (KITS) ×3 IMPLANT
PADDING CAST ABS 6INX4YD NS (CAST SUPPLIES) ×2
PADDING CAST ABS COTTON 6X4 NS (CAST SUPPLIES) ×1 IMPLANT
PADDING CAST COTTON 6X4 STRL (CAST SUPPLIES) ×6 IMPLANT
PATELLA DOME PFC 35MM (Knees) ×3 IMPLANT
PENCIL SMOKE EVACUATOR (MISCELLANEOUS) ×3 IMPLANT
PIN STEINMAN FIXATION KNEE (PIN) ×3 IMPLANT
PROTECTOR NERVE ULNAR (MISCELLANEOUS) ×3 IMPLANT
SET HNDPC FAN SPRY TIP SCT (DISPOSABLE) ×1 IMPLANT
STRIP CLOSURE SKIN 1/2X4 (GAUZE/BANDAGES/DRESSINGS) ×4 IMPLANT
SUT MNCRL AB 4-0 PS2 18 (SUTURE) ×3 IMPLANT
SUT STRATAFIX 0 PDS 27 VIOLET (SUTURE) ×3
SUT VIC AB 2-0 CT1 27 (SUTURE) ×6
SUT VIC AB 2-0 CT1 TAPERPNT 27 (SUTURE) ×3 IMPLANT
SUTURE STRATFX 0 PDS 27 VIOLET (SUTURE) ×1 IMPLANT
TIBIA MBT CEMENT (Knees) ×3 IMPLANT
TRAY FOLEY MTR SLVR 16FR STAT (SET/KITS/TRAYS/PACK) ×3 IMPLANT
WATER STERILE IRR 1000ML POUR (IV SOLUTION) ×6 IMPLANT
WRAP KNEE MAXI GEL POST OP (GAUZE/BANDAGES/DRESSINGS) ×3 IMPLANT

## 2020-02-23 NOTE — Interval H&P Note (Signed)
History and Physical Interval Note:  02/23/2020 1:04 PM  Sherry Lamb  has presented today for surgery, with the diagnosis of right knee osteoarthritis.  The various methods of treatment have been discussed with the patient and family. After consideration of risks, benefits and other options for treatment, the patient has consented to  Procedure(s) with comments: TOTAL KNEE ARTHROPLASTY (Right) - 22min as a surgical intervention.  The patient's history has been reviewed, patient examined, no change in status, stable for surgery.  I have reviewed the patient's chart and labs.  Questions were answered to the patient's satisfaction.     Pilar Plate [name]

## 2020-02-23 NOTE — Addendum Note (Signed)
Addendum  created 02/23/20 1742 by Catalina Gravel, MD   SmartForm saved

## 2020-02-23 NOTE — Anesthesia Postprocedure Evaluation (Signed)
Anesthesia Post Note  Patient: Sherry Lamb  Procedure(s) Performed: TOTAL KNEE ARTHROPLASTY (Right Knee)     Patient location during evaluation: PACU Anesthesia Type: General Level of consciousness: awake and alert, awake and oriented Pain management: pain level controlled Vital Signs Assessment: post-procedure vital signs reviewed and stable Respiratory status: spontaneous breathing, nonlabored ventilation and respiratory function stable Cardiovascular status: blood pressure returned to baseline and stable Postop Assessment: no apparent nausea or vomiting Anesthetic complications: no   No complications documented.  Last Vitals:  Vitals:   02/23/20 1703 02/23/20 1715  BP: (!) 146/77 (!) 143/86  Pulse: (!) 114 92  Resp: 17 13  Temp: (!) 35.8 C (!) 35.7 C  SpO2: 100% 100%    Last Pain:  Vitals:   02/23/20 1255  TempSrc: Oral                 Catalina Gravel

## 2020-02-23 NOTE — Anesthesia Procedure Notes (Signed)
Procedure Name: LMA Insertion Performed by: Mohd. Derflinger J, CRNA Pre-anesthesia Checklist: Patient identified, Emergency Drugs available, Suction available, Patient being monitored and Timeout performed Patient Re-evaluated:Patient Re-evaluated prior to induction Oxygen Delivery Method: Circle system utilized Preoxygenation: Pre-oxygenation with 100% oxygen Induction Type: IV induction Ventilation: Mask ventilation without difficulty LMA: LMA inserted LMA Size: 4.0 Number of attempts: 1 Placement Confirmation: ETT inserted through vocal cords under direct vision,  positive ETCO2 and breath sounds checked- equal and bilateral Tube secured with: Tape Dental Injury: Teeth and Oropharynx as per pre-operative assessment        

## 2020-02-23 NOTE — Anesthesia Procedure Notes (Signed)
Anesthesia Regional Block: Adductor canal block   Pre-Anesthetic Checklist: ,, timeout performed, Correct Patient, Correct Site, Correct Laterality, Correct Procedure, Correct Position, site marked, Risks and benefits discussed,  Surgical consent,  Pre-op evaluation,  At surgeon's request and post-op pain management  Laterality: Right  Prep: chloraprep       Needles:  Injection technique: Single-shot  Needle Type: Echogenic Needle     Needle Length: 9cm  Needle Gauge: 21     Additional Needles:   Procedures:,,,, ultrasound used (permanent image in chart),,,,  Narrative:  Start time: 02/23/2020 2:17 PM End time: 02/23/2020 2:23 PM Injection made incrementally with aspirations every 5 mL.  Performed by: Personally  Anesthesiologist: Catalina Gravel, MD  Additional Notes: No pain on injection. No increased resistance to injection. Injection made in 5cc increments.  Good needle visualization.  Patient tolerated procedure well.

## 2020-02-23 NOTE — Progress Notes (Signed)
Orthopedic Tech Progress Note Patient Details:  Sherry Lamb 24-Mar-1935 [id number] Off cpm     Post Interventions Patient Tolerated: Well Instructions Provided: Care of device   [name] 02/23/2020, 8:41 PM

## 2020-02-23 NOTE — Discharge Instructions (Addendum)
Sherry Arabian, MD Total Joint Specialist [facility] [place] [address] [place], [place] [address] [phone]  TOTAL KNEE REPLACEMENT POSTOPERATIVE DIRECTIONS    Knee Rehabilitation, Guidelines Following Surgery  Results after knee surgery are often greatly improved when you follow the exercise, range of motion and muscle strengthening exercises prescribed by your doctor. Safety measures are also important to protect the knee from further injury. If any of these exercises cause you to have increased pain or swelling in your knee joint, decrease the amount until you are comfortable again and slowly increase them. If you have problems or questions, call your caregiver or physical therapist for advice.   BLOOD CLOT PREVENTION . Take a 325 mg Aspirin two times a day for three weeks following surgery. Then take an 81 mg Aspirin once a day for three weeks. Then resume usual Aspirin regimen. Dennis Bast may resume your vitamins/supplements upon discharge from the hospital. . Do not take any NSAIDs (Advil, Aleve, Ibuprofen, Meloxicam, etc.) until you have discontinued the 325 mg Aspirin.  HOME CARE INSTRUCTIONS  . Remove items at home which could result in a fall. This includes throw rugs or furniture in walking pathways.  . ICE to the affected knee as much as tolerated. Icing helps control swelling. If the swelling is well controlled you will be more comfortable and rehab easier. Continue to use ice on the knee for pain and swelling from surgery. You may notice swelling that will progress down to the foot and ankle. This is normal after surgery. Elevate the leg when you are not up walking on it.    . Continue to use the breathing machine which will help keep your temperature down. It is common for your temperature to cycle up and down following surgery, especially at night when you are not up moving around and exerting yourself. The breathing machine keeps your lungs expanded  and your temperature down. . Do not place pillow under the operative knee, focus on keeping the knee straight while resting  DIET You may resume your previous home diet once you are discharged from the hospital.  DRESSING / [place] / SHOWERING . Keep your bulky bandage on for 2 days. On the third post-operative day you may remove the Ace bandage and gauze. There is a waterproof adhesive bandage on your skin which will stay in place until your first follow-up appointment. Once you remove this you will not need to place another bandage . You may begin showering 3 days following surgery, but do not submerge the incision under water.  ACTIVITY For the first 5 days, the key is rest and control of pain and swelling . Do your home exercises twice a day starting on post-operative day 3. On the days you go to physical therapy, just do the home exercises once that day. . You should rest, ice and elevate the leg for 50 minutes out of every hour. Get up and walk/stretch for 10 minutes per hour. After 5 days you can increase your activity slowly as tolerated. . Walk with your walker as instructed. Use the walker until you are comfortable transitioning to a cane. Walk with the cane in the opposite hand of the operative leg. You may discontinue the cane once you are comfortable and walking steadily. . Avoid periods of inactivity such as sitting longer than an hour when not asleep. This helps prevent blood clots.  . You may discontinue the knee immobilizer once you are able to perform  a straight leg raise while lying down. . You may resume a sexual relationship in one month or when given the OK by your doctor.  . You may return to work once you are cleared by your doctor.  . Do not drive a car for 6 weeks or until released by your surgeon.  . Do not drive while taking narcotics.  TED HOSE STOCKINGS Wear the elastic stockings on both legs for three weeks following surgery during the day. You may remove them  at night for sleeping.  WEIGHT BEARING Weight bearing as tolerated with assist device (walker, cane, etc) as directed, use it as long as suggested by your surgeon or therapist, typically at least 4-6 weeks.  POSTOPERATIVE CONSTIPATION PROTOCOL Constipation - defined medically as fewer than three stools per week and severe constipation as less than one stool per week.  One of the most common issues patients have following surgery is constipation.  Even if you have a regular bowel pattern at home, your normal regimen is likely to be disrupted due to multiple reasons following surgery.  Combination of anesthesia, postoperative narcotics, change in appetite and fluid intake all can affect your bowels.  In order to avoid complications following surgery, here are some recommendations in order to help you during your recovery period.  . Colace (docusate) - Pick up an over-the-counter form of Colace or another stool softener and take twice a day as long as you are requiring postoperative pain medications.  Take with a full glass of water daily.  If you experience loose stools or diarrhea, hold the colace until you stool forms back up. If your symptoms do not get better within 1 week or if they get worse, check with your doctor. . Dulcolax (bisacodyl) - Pick up over-the-counter and take as directed by the product packaging as needed to assist with the movement of your bowels.  Take with a full glass of water.  Use this product as needed if not relieved by Colace only.  . MiraLax (polyethylene glycol) - Pick up over-the-counter to have on hand. MiraLax is a solution that will increase the amount of water in your bowels to assist with bowel movements.  Take as directed and can mix with a glass of water, juice, soda, coffee, or tea. Take if you go more than two days without a movement. Do not use MiraLax more than once per day. Call your doctor if you are still constipated or irregular after using this medication  for 7 days in a row.  If you continue to have problems with postoperative constipation, please contact the office for further assistance and recommendations.  If you experience "the worst abdominal pain ever" or develop nausea or vomiting, please contact the office immediatly for further recommendations for treatment.  ITCHING If you experience itching with your medications, try taking only a single pain pill, or even half a pain pill at a time.  You can also use Benadryl over the counter for itching or also to help with sleep.   MEDICATIONS See your medication summary on the "After Visit Summary" that the nursing staff will review with you prior to discharge.  You may have some home medications which will be placed on hold until you complete the course of blood thinner medication.  It is important for you to complete the blood thinner medication as prescribed by your surgeon.  Continue your approved medications as instructed at time of discharge.  PRECAUTIONS . If you experience chest pain or  shortness of breath - call 911 immediately for transfer to the hospital emergency department.  . If you develop a fever greater that 101 F, purulent drainage from wound, increased redness or drainage from wound, foul odor from the wound/dressing, or calf pain - CONTACT YOUR SURGEON.                                                   FOLLOW-UP APPOINTMENTS Make sure you keep all of your appointments after your operation with your surgeon and caregivers. You should call the office at the above phone number and make an appointment for approximately two weeks after the date of your surgery or on the date instructed by your surgeon outlined in the "After Visit Summary".  RANGE OF MOTION AND STRENGTHENING EXERCISES  Rehabilitation of the knee is important following a knee injury or an operation. After just a few days of immobilization, the muscles of the thigh which control the knee become weakened and shrink  (atrophy). Knee exercises are designed to build up the tone and strength of the thigh muscles and to improve knee motion. Often times heat used for twenty to thirty minutes before working out will loosen up your tissues and help with improving the range of motion but do not use heat for the first two weeks following surgery. These exercises can be done on a training (exercise) mat, on the floor, on a table or on a bed. Use what ever works the best and is most comfortable for you Knee exercises include:  . Leg Lifts - While your knee is still immobilized in a splint or cast, you can do straight leg raises. Lift the leg to 60 degrees, hold for 3 sec, and slowly lower the leg. Repeat 10-20 times 2-3 times daily. Perform this exercise against resistance later as your knee gets better.  Javier Docker and Hamstring Sets - Tighten up the muscle on the front of the thigh (Quad) and hold for 5-10 sec. Repeat this 10-20 times hourly. Hamstring sets are done by pushing the foot backward against an object and holding for 5-10 sec. Repeat as with quad sets.   Leg Slides: Lying on your back, slowly slide your foot toward your buttocks, bending your knee up off the floor (only go as far as is comfortable). Then slowly slide your foot back down until your leg is flat on the floor again.  Angel Wings: Lying on your back spread your legs to the side as far apart as you can without causing discomfort.  A rehabilitation program following serious knee injuries can speed recovery and prevent re-injury in the future due to weakened muscles. Contact your doctor or a physical therapist for more information on knee rehabilitation.   IF YOU ARE TRANSFERRED TO A SKILLED REHAB FACILITY If the patient is transferred to a skilled rehab facility following release from the hospital, a list of the current medications will be sent to the facility for the patient to continue.  When discharged from the skilled rehab facility, please have the  facility set up the patient's [place] prior to being released. Also, the skilled facility will be responsible for providing the patient with their medications at time of release from the facility to include their pain medication, the muscle relaxants, and their blood thinner medication. If the patient is still  at the rehab facility at time of the two week follow up appointment, the skilled rehab facility will also need to assist the patient in arranging follow up appointment in our office and any transportation needs.  MAKE SURE YOU:  . Understand these instructions.  . Get help right away if you are not doing well or get worse.   DENTAL ANTIBIOTICS:  In most cases prophylactic antibiotics for Dental procdeures after total joint surgery are not necessary.  Exceptions are as follows:  1. History of prior total joint infection  2. Severely immunocompromised (Organ Transplant, cancer chemotherapy, Rheumatoid biologic meds such as [place])  3. Poorly controlled diabetes (A1C &gt; 8.0, blood glucose over 200)  If you have one of these conditions, contact your surgeon for an antibiotic prescription, prior to your dental procedure.    Pick up stool softner and laxative for home use following surgery while on pain medications. Do not submerge incision under water. Please use good hand washing techniques while changing dressing each day. May shower starting three days after surgery. Please use a clean towel to pat the incision dry following showers. Continue to use ice for pain and swelling after surgery. Do not use any lotions or creams on the incision until instructed by your surgeon.

## 2020-02-23 NOTE — Progress Notes (Signed)
Assisted Dr. Turk with right, ultrasound guided, adductor canal block. Side rails up, monitors on throughout procedure. See vital signs in flow sheet. Tolerated Procedure well.  

## 2020-02-23 NOTE — Transfer of Care (Signed)
Immediate Anesthesia Transfer of Care Note  Patient: Sherry Lamb  Procedure(s) Performed: TOTAL KNEE ARTHROPLASTY (Right Knee)  Patient Location: PACU  Anesthesia Type:General  Level of Consciousness: awake, alert  and oriented  Airway & Oxygen Therapy: Patient Spontanous Breathing and Patient connected to face mask oxygen  Post-op Assessment: Report given to RN and Post -op Vital signs reviewed and stable  Post vital signs: Reviewed and stable  Last Vitals:  Vitals Value Taken Time  BP    Temp    Pulse    Resp    SpO2      Last Pain:  Vitals:   02/23/20 1255  TempSrc: Oral      Patients Stated Pain Goal: 3 (98/47/30 8569)  Complications: No complications documented.

## 2020-02-23 NOTE — Op Note (Signed)
OPERATIVE REPORT-TOTAL KNEE ARTHROPLASTY   Pre-operative diagnosis- Osteoarthritis  Right knee(s)  Post-operative diagnosis- Osteoarthritis Right knee(s)  Procedure-  Right  Total Knee Arthroplasty  Surgeon- Dione Plover. Daralyn Bert, MD  Assistant- Molli Barrows, PA-C   Anesthesia-  Adductor canal block and spinal  EBL-25 mL   Drains None  Tourniquet time- 32 minutes @ 403 mm Hg  Complications- None  Condition-PACU - hemodynamically stable.   Brief Clinical Note  Sherry Lamb is a 85 y.o. year old female with end stage OA of her right knee with progressively worsening pain and dysfunction. She has constant pain, with activity and at rest and significant functional deficits with difficulties even with ADLs. She has had extensive non-op management including analgesics, injections of cortisone and viscosupplements, and home exercise program, but remains in significant pain with significant dysfunction.Radiographs show bone on bone arthritis lateral and patellofemoral. She presents now for right Total Knee Arthroplasty.    Procedure in detail---   The patient is brought into the operating room and positioned supine on the operating table. After successful administration of  Adductor canal block and spinal,   a tourniquet is placed high on the  Right thigh(s) and the lower extremity is prepped and draped in the usual sterile fashion. Time out is performed by the operating team and then the  Right lower extremity is wrapped in Esmarch, knee flexed and the tourniquet inflated to 300 mmHg.       A midline incision is made with a ten blade through the subcutaneous tissue to the level of the extensor mechanism. A fresh blade is used to make a medial parapatellar arthrotomy. Soft tissue over the proximal medial tibia is subperiosteally elevated to the joint line with a knife and into the semimembranosus bursa with a Cobb elevator. Soft tissue over the proximal lateral tibia is elevated with attention  being paid to avoiding the patellar tendon on the tibial tubercle. The patella is everted, knee flexed 90 degrees and the ACL and PCL are removed. Findings are bone on bone lateral and patellofemoral with large global osteophytes.        The drill is used to create a starting hole in the distal femur and the canal is thoroughly irrigated with sterile saline to remove the fatty contents. The 5 degree Right  valgus alignment guide is placed into the femoral canal and the distal femoral cutting block is pinned to remove 10 mm off the distal femur. Resection is made with an oscillating saw.      The tibia is subluxed forward and the menisci are removed. The extramedullary alignment guide is placed referencing proximally at the medial aspect of the tibial tubercle and distally along the second metatarsal axis and tibial crest. The block is pinned to remove 35mm off the more deficient lateral  side. Resection is made with an oscillating saw. Size 3is the most appropriate size for the tibia and the proximal tibia is prepared with the modular drill and keel punch for that size.      The femoral sizing guide is placed and size 3 is most appropriate. Rotation is marked off the epicondylar axis and confirmed by creating a rectangular flexion gap at 90 degrees. The size 3 cutting block is pinned in this rotation and the anterior, posterior and chamfer cuts are made with the oscillating saw. The intercondylar block is then placed and that cut is made.      Trial size 3 tibial component, trial size 3 posterior stabilized femur  and a 10  mm posterior stabilized rotating platform insert trial is placed. Full extension is achieved with excellent varus/valgus and anterior/posterior balance throughout full range of motion. The patella is everted and thickness measured to be 22  mm. Free hand resection is taken to 12 mm, a 35 template is placed, lug holes are drilled, trial patella is placed, and it tracks normally. Osteophytes are  removed off the posterior femur with the trial in place. All trials are removed and the cut bone surfaces prepared with pulsatile lavage. Cement is mixed and once ready for implantation, the size 3 tibial implant, size  3 posterior stabilized femoral component, and the size 35 patella are cemented in place and the patella is held with the clamp. The trial insert is placed and the knee held in full extension. The Exparel (20 ml mixed with 60 ml saline) is injected into the extensor mechanism, posterior capsule, medial and lateral gutters and subcutaneous tissues.  All extruded cement is removed and once the cement is hard the permanent 10 mm posterior stabilized rotating platform insert is placed into the tibial tray.      The wound is copiously irrigated with saline solution and the extensor mechanism closed with # 0 Stratofix suture. The tourniquet is released for a total tourniquet time of 32  minutes. Flexion against gravity is 140 degrees and the patella tracks normally. Subcutaneous tissue is closed with 2.0 vicryl and subcuticular with running 4.0 Monocryl. The incision is cleaned and dried and steri-strips and a bulky sterile dressing are applied. The limb is placed into a knee immobilizer and the patient is awakened and transported to recovery in stable condition.      Please note that a surgical assistant was a medical necessity for this procedure in order to perform it in a safe and expeditious manner. Surgical assistant was necessary to retract the ligaments and vital neurovascular structures to prevent injury to them and also necessary for proper positioning of the limb to allow for anatomic placement of the prosthesis.   Dione Plover Annaya Bangert, MD    02/23/2020, 4:38 PM

## 2020-02-23 NOTE — Care Plan (Signed)
Ortho Bundle Case Management Note  Patient Details  Name: Sherry Lamb MRN: [id number] Date of Birth: April 26, 1935                  R TKA on 02/23/20. DCP: Home with spouse. 1 story home with 3 steps. DME: No needs. Has RW & 3in1 & elevated toilets. PT: EO [place] 10/21   DME Arranged:  N/A DME Agency:     HH Arranged:    [place] Agency:     Additional Comments: Please contact me with any questions of if this plan should need to change.  Marianne Sofia, RN,CCM [facility]  [phone] 02/23/2020, 2:55 PM

## 2020-02-23 NOTE — Progress Notes (Signed)
Orthopedic Tech Progress Note Patient Details:  Sherry Lamb 14-Sep-1934 [id number]  CPM Right Knee CPM Right Knee: On Right Knee Flexion (Degrees): 40 Right Knee Extension (Degrees): 10  Post Interventions Patient Tolerated: Well Instructions Provided: Care of device  [place] Pink 02/23/2020, 5:15 PM

## 2020-02-23 NOTE — Anesthesia Preprocedure Evaluation (Signed)
Anesthesia Evaluation  Patient identified by MRN, date of birth, ID band Patient awake    Reviewed: Allergy & Precautions, NPO status , Patient's Chart, lab work & pertinent test results  History of Anesthesia Complications (+) PONV and history of anesthetic complications  Airway Mallampati: II  TM Distance: >3 FB Neck ROM: Full    Dental  (+) Teeth Intact, Dental Advisory Given   Pulmonary neg pulmonary ROS,    Pulmonary exam normal breath sounds clear to auscultation       Cardiovascular negative cardio ROS Normal cardiovascular exam Rhythm:Regular Rate:Normal     Neuro/Psych  Headaches,    GI/Hepatic Neg liver ROS, GERD  ,  Endo/Other  negative endocrine ROS  Renal/GU negative Renal ROS     Musculoskeletal  (+) Arthritis , Osteoarthritis,    Abdominal   Peds  Hematology negative hematology ROS (+) Plt 219k   Anesthesia Other Findings Day of surgery medications reviewed with the patient.  Reproductive/Obstetrics                             Anesthesia Physical Anesthesia Plan  ASA: III  Anesthesia Plan: Spinal   Post-op Pain Management:  Regional for Post-op pain   Induction: Intravenous  PONV Risk Score and Plan: 3 and Propofol infusion and Treatment may vary due to age or medical condition  Airway Management Planned: Nasal Cannula and Natural Airway  Additional Equipment:   Intra-op Plan:   Post-operative Plan:   Informed Consent: I have reviewed the patients History and Physical, chart, labs and discussed the procedure including the risks, benefits and alternatives for the proposed anesthesia with the patient or authorized representative who has indicated his/her understanding and acceptance.       Plan Discussed with: CRNA  Anesthesia Plan Comments:         Anesthesia Quick Evaluation

## 2020-02-24 ENCOUNTER — Encounter (HOSPITAL_COMMUNITY): Payer: Self-pay | Admitting: Orthopedic Surgery

## 2020-02-24 DIAGNOSIS — M1711 Unilateral primary osteoarthritis, right knee: Secondary | ICD-10-CM | POA: Diagnosis not present

## 2020-02-24 DIAGNOSIS — Z96643 Presence of artificial hip joint, bilateral: Secondary | ICD-10-CM | POA: Diagnosis not present

## 2020-02-24 DIAGNOSIS — Z7982 Long term (current) use of aspirin: Secondary | ICD-10-CM | POA: Diagnosis not present

## 2020-02-24 LAB — CBC
HCT: 38 % (ref 36.0–46.0)
Hemoglobin: 12.7 g/dL (ref 12.0–15.0)
MCH: 32.2 pg (ref 26.0–34.0)
MCHC: 33.4 g/dL (ref 30.0–36.0)
MCV: 96.2 fL (ref 80.0–100.0)
Platelets: 224 10*3/uL (ref 150–400)
RBC: 3.95 MIL/uL (ref 3.87–5.11)
RDW: 13.6 % (ref 11.5–15.5)
WBC: 8.9 10*3/uL (ref 4.0–10.5)
nRBC: 0 % (ref 0.0–0.2)

## 2020-02-24 LAB — BASIC METABOLIC PANEL
Anion gap: 9 (ref 5–15)
BUN: 18 mg/dL (ref 8–23)
CO2: 24 mmol/L (ref 22–32)
Calcium: 8.4 mg/dL — ABNORMAL LOW (ref 8.9–10.3)
Chloride: 104 mmol/L (ref 98–111)
Creatinine, Ser: 0.55 mg/dL (ref 0.44–1.00)
GFR, Estimated: >60 mL/min (ref >60)
Glucose, Bld: 173 mg/dL — ABNORMAL HIGH (ref 70–99)
Potassium: 4 mmol/L (ref 3.5–5.1)
Sodium: 137 mmol/L (ref 135–145)

## 2020-02-24 MED ORDER — ASPIRIN 325 MG PO TBEC: 325.0000 mg | DELAYED_RELEASE_TABLET | Freq: Two times a day (BID) | ORAL | 0 refills | Status: AC

## 2020-02-24 MED ORDER — TRAMADOL HCL 50 MG PO TABS
50.0000 mg | ORAL_TABLET | Freq: Four times a day (QID) | ORAL | 0 refills | Status: DC | PRN
Start: 2020-02-24 — End: 2020-07-07

## 2020-02-24 MED ORDER — HYDROMORPHONE HCL 2 MG PO TABS: 2.0000 mg | ORAL_TABLET | Freq: Four times a day (QID) | ORAL | 0 refills | Status: DC | PRN

## 2020-02-24 MED ORDER — METHOCARBAMOL 500 MG PO TABS: 500.0000 mg | ORAL_TABLET | Freq: Four times a day (QID) | ORAL | 0 refills | Status: DC | PRN

## 2020-02-24 NOTE — Progress Notes (Signed)
Physical Therapy Treatment Patient Details Name: Sherry Lamb MRN: [id number] DOB: 1934/12/08 Today's Date: 02/24/2020    History of Present Illness s/p R TKA    PT Comments    Pt continues to progress well. Reviewed gait, transfer safety, stairs with husband present.  Reviewed TKA HEP and use of ice. Ready for d/c with family assist   Follow Up Recommendations  Follow surgeon's recommendation for DC plan and follow-up therapies;Supervision for mobility/OOB     Equipment Recommendations  None recommended by PT    Recommendations for Other Services       Precautions / Restrictions Precautions Precautions: Knee;Fall Restrictions Weight Bearing Restrictions: No Other Position/Activity Restrictions: WBAT    Mobility  Bed Mobility               General bed mobility comments: in chair  Transfers Overall transfer level: Needs assistance Equipment used: Rolling walker (2 wheeled) Transfers: Sit to/from Stand Sit to Stand: Min guard         General transfer comment: cues for hand placement  Ambulation/Gait Ambulation/Gait assistance: Min guard;Supervision Gait Distance (Feet): 80 Feet Assistive device: Rolling walker (2 wheeled) Gait Pattern/deviations: Step-to pattern;Decreased stance time - right     General Gait Details: cues for sequence and RW position   Stairs Stairs: Yes Stairs assistance: Min guard Stair Management: One rail Right;One rail Left;Step to pattern;Sideways Number of Stairs: 3 General stair comments: cues for safety and sequence, husband present    Wheelchair Mobility    Modified Rankin (Stroke Patients Only)       Balance Overall balance assessment: Mild deficits observed, not formally tested                                          Cognition Arousal/Alertness: Awake/alert Behavior During Therapy: WFL for tasks assessed/performed Overall Cognitive Status: Within Functional Limits for tasks assessed                                         Exercises Total Joint Exercises Ankle Circles/Pumps: AROM;Both;10 reps Quad Sets: AROM;Both;10 reps Heel Slides: AAROM;Right;10 reps Straight Leg Raises: AROM;Right;10 reps;AAROM    General Comments        Pertinent Vitals/Pain Pain Assessment: 0-10 Pain Score: 5  Pain Location: R knee Pain Descriptors / Indicators: Aching;Grimacing;Sore Pain Intervention(s): Limited activity within patient's tolerance;Monitored during session;Premedicated before session;Repositioned    Home Living Family/patient expects to be discharged to:: Private residence Living Arrangements: Spouse/significant other   Type of Home: House Home Access: Stairs to enter Entrance Stairs-Rails: Right Home Layout: One level Home Equipment: Environmental consultant - 2 wheels;Bedside commode;Shower seat;Cane - single point      Prior Function Level of Independence: Independent          PT Goals (current goals can now be found in the care plan section) Acute Rehab PT Goals Patient Stated Goal: home today PT Goal Formulation: With patient Time For Goal Achievement: 03/02/20 Potential to Achieve Goals: Good Progress towards PT goals: Progressing toward goals    Frequency    7X/week      PT Plan Current plan remains appropriate    Co-evaluation              AM-PAC PT "6 Clicks" Mobility   Outcome Measure  Help needed turning  from your back to your side while in a flat bed without using bedrails?: A Little Help needed moving from lying on your back to sitting on the side of a flat bed without using bedrails?: A Little Help needed moving to and from a bed to a chair (including a wheelchair)?: A Little Help needed standing up from a chair using your arms (e.g., wheelchair or bedside chair)?: A Little Help needed to walk in hospital room?: A Little Help needed climbing 3-5 steps with a railing? : A Little 6 Click Score: 18    End of Session Equipment  Utilized During Treatment: Gait belt Activity Tolerance: Patient tolerated treatment well Patient left: in chair;with call bell/phone within reach;with chair alarm set;with family/visitor present Nurse Communication: Mobility status PT Visit Diagnosis: Difficulty in walking, not elsewhere classified (R26.2)     Time: 1330-1404 PT Time Calculation (min) (ACUTE ONLY): 34 min  Charges:  $Gait Training: 8-22 mins $Therapeutic Exercise: 8-22 mins                     Baxter Flattery, PT  Acute Rehab Dept ([place]) [phone] Pager [phone]  02/24/2020    Sherry Lamb 02/24/2020, 2:12 PM

## 2020-02-24 NOTE — Evaluation (Signed)
Physical Therapy Evaluation Patient Details Name: Sherry Lamb MRN: [id number] DOB: Oct 07, 1934 Today's Date: 02/24/2020   History of Present Illness  s/p R TKA  Clinical Impression  Pt is s/p TKA resulting in the deficits listed below (see PT Problem List).  Pt will benefit from skilled PT to increase their independence and safety with mobility to allow discharge to the venue listed below.  Pt did very well with PT this am, see below for details. Will see again in pm and pt should be ready for d/c later today.t     Follow Up Recommendations Follow surgeon's recommendation for DC plan and follow-up therapies;Supervision for mobility/OOB    Equipment Recommendations  None recommended by PT    Recommendations for Other Services       Precautions / Restrictions Precautions Precautions: Knee;Fall Restrictions Weight Bearing Restrictions: No Other Position/Activity Restrictions: WBAT      Mobility  Bed Mobility               General bed mobility comments: in chair  Transfers Overall transfer level: Needs assistance Equipment used: Rolling walker (2 wheeled) Transfers: Sit to/from Stand Sit to Stand: Min assist         General transfer comment: cues for hand placement  Ambulation/Gait Ambulation/Gait assistance: Min assist;Min guard Gait Distance (Feet): 75 Feet Assistive device: Rolling walker (2 wheeled) Gait Pattern/deviations: Step-to pattern;Decreased stance time - right     General Gait Details: cues for sequence and RW position  Stairs            Wheelchair Mobility    Modified Rankin (Stroke Patients Only)       Balance Overall balance assessment: Mild deficits observed, not formally tested                                           Pertinent Vitals/Pain Pain Assessment: 0-10 Pain Score: 4  Pain Location: R knee Pain Descriptors / Indicators: Aching;Grimacing;Sore Pain Intervention(s): Limited activity within  patient's tolerance;Monitored during session;Repositioned;Premedicated before session    [place] expects to be discharged to:: Private residence Living Arrangements: Spouse/significant other   Type of Home: House Home Access: Stairs to enter Entrance Stairs-Rails: Right Entrance Stairs-Number of Steps: 3 Home Layout: One level Home Equipment: [place] - 2 wheels;Bedside commode;Shower seat;Cane - single point      Prior Function Level of Independence: Independent               Hand Dominance        Extremity/Trunk Assessment   Upper Extremity Assessment Upper Extremity Assessment: Overall WFL for tasks assessed    Lower Extremity Assessment Lower Extremity Assessment: RLE deficits/detail RLE Deficits / Details: ankle WFL, knee and hip grossly 2+/5, limited by post op pain and weakness       Communication   Communication: No difficulties  Cognition Arousal/Alertness: Awake/alert Behavior During Therapy: WFL for tasks assessed/performed Overall Cognitive Status: Within Functional Limits for tasks assessed                                        General Comments      Exercises Total Joint Exercises Ankle Circles/Pumps: AROM;Both;10 reps Quad Sets: AROM;Both;10 reps   Assessment/Plan    PT Assessment Patient needs continued PT services  PT Problem  List Decreased strength;Decreased mobility;Decreased range of motion;Decreased activity tolerance;Decreased balance;Decreased knowledge of use of DME;Pain       PT Treatment Interventions DME instruction;Therapeutic activities;Gait training;Functional mobility training;Stair training;Therapeutic exercise;Patient/family education    PT Goals (Current goals can be found in the Care Plan section)  Acute Rehab PT Goals Patient Stated Goal: home today PT Goal Formulation: With patient Time For Goal Achievement: 03/02/20 Potential to Achieve Goals: Good    Frequency 7X/week    Barriers to discharge        Co-evaluation               AM-PAC PT "6 Clicks" Mobility  Outcome Measure Help needed turning from your back to your side while in a flat bed without using bedrails?: A Little Help needed moving from lying on your back to sitting on the side of a flat bed without using bedrails?: A Little Help needed moving to and from a bed to a chair (including a wheelchair)?: A Little Help needed standing up from a chair using your arms (e.g., wheelchair or bedside chair)?: A Little Help needed to walk in hospital room?: A Little Help needed climbing 3-5 steps with a railing? : A Little 6 Click Score: 18    End of Session Equipment Utilized During Treatment: Gait belt Activity Tolerance: Patient tolerated treatment well Patient left: with call bell/phone within reach;in chair;with chair alarm set   PT Visit Diagnosis: Difficulty in walking, not elsewhere classified (R26.2)    Time: 0712-1975 PT Time Calculation (min) (ACUTE ONLY): 27 min   Charges:   PT Evaluation $PT Eval Low Complexity: 1 Low PT Treatments $Gait Training: 8-22 mins        Baxter Flattery, PT  Acute Rehab Dept ([place]) [phone] Pager [phone]  02/24/2020   [facility] 02/24/2020, 11:33 AM

## 2020-02-24 NOTE — Progress Notes (Signed)
Patient discharged to home w/ husband. Given all belongings, instructions. Verbalized understanding of instructions. Escorted to pov via w/c. 

## 2020-02-24 NOTE — Progress Notes (Signed)
   Subjective: 1 Day Post-Op Procedure(s) (LRB): TOTAL KNEE ARTHROPLASTY (Right) Patient reports pain as mild.   Patient seen in rounds by Dr. Wynelle Link. Patient is well, and has had no acute complaints or problems other than pain in the right knee. Denies chest pain, SOB, or calf pain. No issues overnight. Foley catheter removed this AM. We will continue therapy today.   Objective: Vital signs in last 24 hours: Temp:  [96.2 F (35.7 C)-97.7 F (36.5 C)] 97.6 F (36.4 C) (10/19 0623) Pulse Rate:  [73-114] 76 (10/19 0623) Resp:  [13-20] 17 (10/19 0623) BP: (119-157)/(76-93) 119/76 (10/19 0623) SpO2:  [99 %-100 %] 99 % (10/19 0623) Weight:  [67.9 kg] 67.9 kg (10/18 1253)  Intake/Output from previous day:  Intake/Output Summary (Last 24 hours) at 02/24/2020 0731 Last data filed at 02/24/2020 0600 Gross per 24 hour  Intake 1699.77 ml  Output 825 ml  Net 874.77 ml     Intake/Output this shift: No intake/output data recorded.  Labs: Recent Labs    02/24/20 0335  HGB 12.7   Recent Labs    02/24/20 0335  WBC 8.9  RBC 3.95  HCT 38.0  PLT 224   Recent Labs    02/24/20 0335  NA 137  K 4.0  CL 104  CO2 24  BUN 18  CREATININE 0.55  GLUCOSE 173*  CALCIUM 8.4*   No results for input(s): LABPT, INR in the last 72 hours.  Exam: General - Patient is Alert and Oriented Extremity - Neurologically intact Neurovascular intact Sensation intact distally Dorsiflexion/Plantar flexion intact Dressing - dressing C/D/I Motor Function - intact, moving foot and toes well on exam.   Past Medical History:  Diagnosis Date  . Arthritis   . Frequent headaches   . GERD (gastroesophageal reflux disease)   . Hyperlipidemia   . Pneumonia   . PONV (postoperative nausea and vomiting)     Assessment/Plan: 1 Day Post-Op Procedure(s) (LRB): TOTAL KNEE ARTHROPLASTY (Right) Principal Problem:   Osteoarthritis of knee Active Problems:   Primary osteoarthritis of right  knee  Estimated body mass index is 25.7 kg/m as calculated from the following:   Height as of this encounter: 5\' 4"  (1.626 m).   Weight as of this encounter: 67.9 kg. Advance diet Up with therapy D/C IV fluids   Patient's anticipated LOS is less than 2 midnights, meeting these requirements: - Younger than 71 - Lives within 1 hour of care - Has a competent adult at home to recover with post-op recover - NO history of  - Chronic pain requiring opiods  - Diabetes  - Coronary Artery Disease  - Heart failure  - Heart attack  - Stroke  - DVT/VTE  - Cardiac arrhythmia  - Respiratory Failure/COPD  - Renal failure  - Anemia  - Advanced Liver disease  DVT Prophylaxis - Aspirin Weight bearing as tolerated. Continue therapy.  Plan is to go Home after hospital stay. Plan for discharge later today if meeting goals with therapy. Scheduled for OPPT at [facility] in [place]. Follow-up in the office in 2 weeks.   The PDMP database was reviewed today (02/24/2020) prior to any opioid medications being prescribed to this patient.   Theresa Duty, PA-C Orthopedic Surgery [phone] 02/24/2020, 7:31 AM

## 2020-02-25 NOTE — Discharge Summary (Signed)
Physician Discharge Summary   Patient ID: Sherry Lamb MRN: [id number] DOB/AGE: [birth year] 83 y.o.  Admit date: 02/23/2020 Discharge date: 02/24/2020  Primary Diagnosis: Osteoarthritis, right knee   Admission Diagnoses:  Past Medical History:  Diagnosis Date   Arthritis    Frequent headaches    GERD (gastroesophageal reflux disease)    Hyperlipidemia    Pneumonia    PONV (postoperative nausea and vomiting)    Discharge Diagnoses:   Principal Problem:   Osteoarthritis of knee Active Problems:   Primary osteoarthritis of right knee  Estimated body mass index is 25.7 kg/m as calculated from the following:   Height as of this encounter: 5\' 4"  (1.626 m).   Weight as of this encounter: 67.9 kg.  Procedure:  Procedure(s) (LRB): TOTAL KNEE ARTHROPLASTY (Right)   Consults: None  HPI: Sherry Lamb is a 85 y.o. year old female with end stage OA of her right knee with progressively worsening pain and dysfunction. She has constant pain, with activity and at rest and significant functional deficits with difficulties even with ADLs. She has had extensive non-op management including analgesics, injections of cortisone and viscosupplements, and home exercise program, but remains in significant pain with significant dysfunction.Radiographs show bone on bone arthritis lateral and patellofemoral. She presents now for right Total Knee Arthroplasty.    Laboratory Data: Admission on 02/23/2020, Discharged on 02/24/2020  Component Date Value Ref Range Status   ABO/RH(D) 02/23/2020    Final                   Value:O POS Performed at [facility] [address] [place], [place] [address]    WBC 02/24/2020 8.9  4.0 - 10.5 K/uL Final   RBC 02/24/2020 3.95  3.87 - 5.11 MIL/uL Final   Hemoglobin 02/24/2020 12.7  12.0 - 15.0 g/dL Final   HCT 02/24/2020 38.0  36 - 46 % Final   MCV 02/24/2020 96.2  80.0 - 100.0 fL Final   MCH 02/24/2020 32.2  26.0 - 34.0 pg Final    MCHC 02/24/2020 33.4  30.0 - 36.0 g/dL Final   RDW 02/24/2020 13.6  11.5 - 15.5 % Final   Platelets 02/24/2020 224  150 - 400 K/uL Final   nRBC 02/24/2020 0.0  0.0 - 0.2 % Final   Performed at [facility], [place] [address], [place], [place] [address]   Sodium 02/24/2020 137  135 - 145 mmol/L Final   Potassium 02/24/2020 4.0  3.5 - 5.1 mmol/L Final   Chloride 02/24/2020 104  98 - 111 mmol/L Final   CO2 02/24/2020 24  22 - 32 mmol/L Final   Glucose, Bld 02/24/2020 173* 70 - 99 mg/dL Final   Glucose reference range applies only to samples taken after fasting for at least 8 hours.   BUN 02/24/2020 18  8 - 23 mg/dL Final   Creatinine, Ser 02/24/2020 0.55  0.44 - 1.00 mg/dL Final   Calcium 02/24/2020 8.4* 8.9 - 10.3 mg/dL Final   GFR, Estimated 02/24/2020 >60  >60 mL/min Final   Anion gap 02/24/2020 9  5 - 15 Final   Performed at [facility], [place] [address], [place], [place] [address]  Hospital Outpatient Visit on 02/19/2020  Component Date Value Ref Range Status   SARS Coronavirus 2 02/19/2020 NEGATIVE  NEGATIVE Final   Comment: (NOTE) SARS-CoV-2 target nucleic acids are NOT DETECTED.  The SARS-CoV-2 RNA is generally detectable in upper and lower respiratory specimens during the acute  phase of infection. Negative results do not preclude SARS-CoV-2 infection, do not rule out co-infections with other pathogens, and should not be used as the sole basis for treatment or other patient management decisions. Negative results must be combined with clinical observations, patient history, and epidemiological information. The expected result is Negative.  Fact Sheet for Patients: SugarRoll.be  Fact Sheet for Healthcare Providers: https://www.woods-mathews.com/  This test is not yet approved or cleared by the [place] FDA and  has been authorized for detection and/or diagnosis of SARS-CoV-2  by FDA under an Emergency Use Authorization (EUA). This EUA will remain  in effect (meaning this test can be used) for the duration of the COVID-19 declaration under Se                          ction 564(b)(1) of the Act, 21 U.S.C. section 360bbb-3(b)(1), unless the authorization is terminated or revoked sooner.  Performed at [facility], [place] [address] [place], [place] [address]   Hospital Outpatient Visit on 02/13/2020  Component Date Value Ref Range Status   MRSA, PCR 02/13/2020 NEGATIVE  NEGATIVE Final   Staphylococcus aureus 02/13/2020 NEGATIVE  NEGATIVE Final   Comment: (NOTE) The Xpert SA Assay (FDA approved for NASAL specimens in patients 65 years of age and older), is one component of a comprehensive surveillance program. It is not intended to diagnose infection nor to guide or monitor treatment. Performed at [facility], [place] [address] [place], [place] [address]    aPTT 02/13/2020 27  24 - 36 seconds Final   Performed at [facility], [place] [address] [place], [place] [address]   WBC 02/13/2020 6.0  4.0 - 10.5 K/uL Final   RBC 02/13/2020 4.48  3.87 - 5.11 MIL/uL Final   Hemoglobin 02/13/2020 14.4  12.0 - 15.0 g/dL Final   HCT 02/13/2020 43.0  36 - 46 % Final   MCV 02/13/2020 96.0  80.0 - 100.0 fL Final   MCH 02/13/2020 32.1  26.0 - 34.0 pg Final   MCHC 02/13/2020 33.5  30.0 - 36.0 g/dL Final   RDW 02/13/2020 13.8  11.5 - 15.5 % Final   Platelets 02/13/2020 219  150 - 400 K/uL Final   nRBC 02/13/2020 0.0  0.0 - 0.2 % Final   Performed at [facility], [place] [address], [place], [place] [address]   Sodium 02/13/2020 141  135 - 145 mmol/L Final   Potassium 02/13/2020 3.9  3.5 - 5.1 mmol/L Final   Chloride 02/13/2020 102  98 - 111 mmol/L Final   CO2 02/13/2020 27  22 - 32 mmol/L Final   Glucose, Bld 02/13/2020 102* 70 - 99 mg/dL Final   Glucose reference range applies only to samples taken  after fasting for at least 8 hours.   BUN 02/13/2020 29* 8 - 23 mg/dL Final   Creatinine, Ser 02/13/2020 0.49  0.44 - 1.00 mg/dL Final   Calcium 02/13/2020 9.4  8.9 - 10.3 mg/dL Final   Total Protein 02/13/2020 7.4  6.5 - 8.1 g/dL Final   Albumin 02/13/2020 4.1  3.5 - 5.0 g/dL Final   AST 02/13/2020 26  15 - 41 U/L Final   ALT 02/13/2020 19  0 - 44 U/L Final   Alkaline Phosphatase 02/13/2020 47  38 - 126 U/L Final   Total Bilirubin 02/13/2020 0.9  0.3 - 1.2 mg/dL Final   GFR, Estimated 02/13/2020 >60  >60 mL/min  Final   Anion gap 02/13/2020 12  5 - 15 Final   Performed at [facility], [place] [address], [place], [place] [address]   Prothrombin Time 02/13/2020 12.4  11.4 - 15.2 seconds Final   INR 02/13/2020 1.0  0.8 - 1.2 Final   Comment: (NOTE) INR goal varies based on device and disease states. Performed at [facility], [place] [address], [place], [place] [address]    ABO/RH(D) 02/13/2020 O POS   Final   Antibody Screen 02/13/2020 NEG   Final   Sample Expiration 02/13/2020 02/26/2020,2359   Final   Extend sample reason 02/13/2020    Final                   Value:NO TRANSFUSIONS OR PREGNANCY IN THE PAST 3 MONTHS Performed at [facility] [address] [place], [place] [address]      X-Rays:DG Ankle Complete Right  Result Date: 01/28/2020 Please see detailed radiograph report in office note.   EKG: Orders placed or performed in visit on 02/16/20   EKG 12-Lead     Hospital Course: Sherry Lamb is a 85 y.o. who was admitted to [facility]. They were brought to the operating room on 02/23/2020 and underwent Procedure(s): TOTAL KNEE ARTHROPLASTY.  Patient tolerated the procedure well and was later transferred to the recovery room and then to the orthopaedic floor for postoperative care. They were given PO and IV analgesics for pain control following their surgery. They were given 24 hours of  postoperative antibiotics of  Anti-infectives (From admission, onward)   Start     Dose/Rate Route Frequency Ordered Stop   02/23/20 2200  ceFAZolin (ANCEF) IVPB 2g/100 mL premix        2 g 200 mL/hr over 30 Minutes Intravenous Every 6 hours 02/23/20 1823 02/24/20 0447   02/23/20 1245  ceFAZolin (ANCEF) IVPB 2g/100 mL premix        2 g 200 mL/hr over 30 Minutes Intravenous On call to O.R. 02/23/20 1240 02/23/20 1553     and started on DVT prophylaxis in the form of Aspirin.   PT and OT were ordered for total joint protocol. Discharge planning consulted to help with postop disposition and equipment needs.  Patient had a good night on the evening of surgery. They started to get up OOB with therapy on POD #0. Pt was seen during rounds and was ready to go home pending progress with therapy. She worked with therapy on POD #1 and was meeting her goals. Pt was discharged to home later that day in stable condition.  Diet: Regular diet Activity: WBAT Follow-up: in 2 weeks Disposition: Home with OPPT at [facility] in [place] Discharged Condition: stable   Discharge Instructions    Call MD / Call 911   Complete by: As directed    If you experience chest pain or shortness of breath, CALL 911 and be transported to the hospital emergency room.  If you develope a fever above 101 F, pus (white drainage) or increased drainage or redness at the wound, or calf pain, call your surgeon's office.   Change dressing   Complete by: As directed    You may remove the bulky bandage (ACE wrap and gauze) two days after surgery. You will have an adhesive waterproof bandage underneath. Leave this in place until your first follow-up appointment.   Constipation Prevention   Complete by: As directed    Drink plenty of fluids.  Prune  juice may be helpful.  You may use a stool softener, such as Colace (over the counter) 100 mg twice a day.  Use MiraLax (over the counter) for constipation as needed.   Diet - low sodium heart  healthy   Complete by: As directed    Do not put a pillow under the knee. Place it under the heel.   Complete by: As directed    Driving restrictions   Complete by: As directed    No driving for two weeks   TED hose   Complete by: As directed    Use stockings (TED hose) for three weeks on both leg(s).  You may remove them at night for sleeping.   Weight bearing as tolerated   Complete by: As directed      Allergies as of 02/24/2020      Reactions   Penicillins Other (See Comments)   Stomach tightness. Tolerated Cephalosporin Date: 02/24/20.   Prednisone Other (See Comments)   GI intolerance      Medication List    STOP taking these medications   naproxen sodium 220 MG tablet Commonly known as: ALEVE     TAKE these medications   alendronate 70 MG tablet Commonly known as: FOSAMAX TAKE 1 TABLET EVERY 7 DAYS WITH A FULL GLASS OF WATER ON AN EMPTY STOMACH What changed: See the new instructions.   aspirin 325 MG EC tablet Take 1 tablet (325 mg total) by mouth 2 (two) times daily for 20 days. Then take one 81 mg aspirin once a day for three weeks. Then resume regular aspirin regimen. What changed:   medication strength  how much to take  when to take this  additional instructions   AUSTRALIAN DREAM ARTHRITIS EX Apply 1 application topically daily as needed (pain).   Biotin 5000 5 MG Caps Generic drug: Biotin Take 5 mg by mouth daily.   HYDROmorphone 2 MG tablet Commonly known as: DILAUDID Take 1-2 tablets (2-4 mg total) by mouth every 6 (six) hours as needed for severe pain.   methocarbamol 500 MG tablet Commonly known as: ROBAXIN Take 1 tablet (500 mg total) by mouth every 6 (six) hours as needed for muscle spasms.   REFRESH OP Place 1 drop into both eyes 2 (two) times daily.   rosuvastatin 5 MG tablet Commonly known as: CRESTOR Take 1 tablet (5 mg total) by mouth daily.   traMADol 50 MG tablet Commonly known as: ULTRAM Take 1-2 tablets (50-100 mg  total) by mouth every 6 (six) hours as needed for moderate pain.   Vitamin D 50 MCG (2000 UT) Caps Take 4,000 Units by mouth daily.            Discharge Care Instructions  (From admission, onward)         Start     Ordered   02/24/20 0000  Weight bearing as tolerated        02/24/20 0735   02/24/20 0000  Change dressing       Comments: You may remove the bulky bandage (ACE wrap and gauze) two days after surgery. You will have an adhesive waterproof bandage underneath. Leave this in place until your first follow-up appointment.   02/24/20 0735          Follow-up Information    Gaynelle Arabian, MD. Go on 03/09/2020.   Specialty: Orthopedic Surgery Why: You are scheduled for 1st post op appointment on Tuesday November 2 at 3:30pm with Theresa Duty, PA. Contact information: [address] [place] [address] [phone]        EMERGEORTHO. Go on 02/26/2020.   Why: You are scheduled for physical therapy eval on Thursday October 21 at [facility] in [place].  Contact information: [place] [address] [phone]              Signed: Theresa Duty, PA-C Orthopedic Surgery 02/25/2020, 10:46 AM

## 2020-02-28 DIAGNOSIS — M25562 Pain in left knee: Secondary | ICD-10-CM | POA: Diagnosis not present

## 2020-02-28 DIAGNOSIS — M25561 Pain in right knee: Secondary | ICD-10-CM | POA: Diagnosis not present

## 2020-02-28 DIAGNOSIS — Z96651 Presence of right artificial knee joint: Secondary | ICD-10-CM | POA: Diagnosis not present

## 2020-02-28 DIAGNOSIS — M25661 Stiffness of right knee, not elsewhere classified: Secondary | ICD-10-CM | POA: Diagnosis not present

## 2020-03-06 DIAGNOSIS — M25661 Stiffness of right knee, not elsewhere classified: Secondary | ICD-10-CM | POA: Diagnosis not present

## 2020-03-06 DIAGNOSIS — Z96651 Presence of right artificial knee joint: Secondary | ICD-10-CM | POA: Diagnosis not present

## 2020-03-06 DIAGNOSIS — M25561 Pain in right knee: Secondary | ICD-10-CM | POA: Diagnosis not present

## 2020-03-06 DIAGNOSIS — M25562 Pain in left knee: Secondary | ICD-10-CM | POA: Diagnosis not present

## 2020-03-08 DIAGNOSIS — Z96651 Presence of right artificial knee joint: Secondary | ICD-10-CM | POA: Insufficient documentation

## 2020-03-12 DIAGNOSIS — M25562 Pain in left knee: Secondary | ICD-10-CM | POA: Diagnosis not present

## 2020-03-12 DIAGNOSIS — M25661 Stiffness of right knee, not elsewhere classified: Secondary | ICD-10-CM | POA: Diagnosis not present

## 2020-03-12 DIAGNOSIS — M25561 Pain in right knee: Secondary | ICD-10-CM | POA: Diagnosis not present

## 2020-03-12 DIAGNOSIS — Z96651 Presence of right artificial knee joint: Secondary | ICD-10-CM | POA: Diagnosis not present

## 2020-03-17 DIAGNOSIS — M25562 Pain in left knee: Secondary | ICD-10-CM | POA: Diagnosis not present

## 2020-03-17 DIAGNOSIS — Z96651 Presence of right artificial knee joint: Secondary | ICD-10-CM | POA: Diagnosis not present

## 2020-03-17 DIAGNOSIS — M25561 Pain in right knee: Secondary | ICD-10-CM | POA: Diagnosis not present

## 2020-03-17 DIAGNOSIS — M25661 Stiffness of right knee, not elsewhere classified: Secondary | ICD-10-CM | POA: Diagnosis not present

## 2020-03-19 DIAGNOSIS — Z96651 Presence of right artificial knee joint: Secondary | ICD-10-CM | POA: Diagnosis not present

## 2020-03-19 DIAGNOSIS — M25562 Pain in left knee: Secondary | ICD-10-CM | POA: Diagnosis not present

## 2020-03-19 DIAGNOSIS — M25661 Stiffness of right knee, not elsewhere classified: Secondary | ICD-10-CM | POA: Diagnosis not present

## 2020-03-19 DIAGNOSIS — M25561 Pain in right knee: Secondary | ICD-10-CM | POA: Diagnosis not present

## 2020-03-24 DIAGNOSIS — M25661 Stiffness of right knee, not elsewhere classified: Secondary | ICD-10-CM | POA: Diagnosis not present

## 2020-03-24 DIAGNOSIS — Z96651 Presence of right artificial knee joint: Secondary | ICD-10-CM | POA: Diagnosis not present

## 2020-03-24 DIAGNOSIS — M25561 Pain in right knee: Secondary | ICD-10-CM | POA: Diagnosis not present

## 2020-03-24 DIAGNOSIS — M25562 Pain in left knee: Secondary | ICD-10-CM | POA: Diagnosis not present

## 2020-03-26 DIAGNOSIS — M25661 Stiffness of right knee, not elsewhere classified: Secondary | ICD-10-CM | POA: Diagnosis not present

## 2020-03-26 DIAGNOSIS — Z96651 Presence of right artificial knee joint: Secondary | ICD-10-CM | POA: Diagnosis not present

## 2020-03-26 DIAGNOSIS — M25561 Pain in right knee: Secondary | ICD-10-CM | POA: Diagnosis not present

## 2020-03-26 DIAGNOSIS — M25562 Pain in left knee: Secondary | ICD-10-CM | POA: Diagnosis not present

## 2020-03-30 DIAGNOSIS — Z96651 Presence of right artificial knee joint: Secondary | ICD-10-CM | POA: Diagnosis not present

## 2020-04-02 ENCOUNTER — Other Ambulatory Visit: Payer: Self-pay | Admitting: Family Medicine

## 2020-04-03 DIAGNOSIS — M25661 Stiffness of right knee, not elsewhere classified: Secondary | ICD-10-CM | POA: Diagnosis not present

## 2020-04-03 DIAGNOSIS — M25562 Pain in left knee: Secondary | ICD-10-CM | POA: Diagnosis not present

## 2020-04-03 DIAGNOSIS — Z96651 Presence of right artificial knee joint: Secondary | ICD-10-CM | POA: Diagnosis not present

## 2020-04-03 DIAGNOSIS — M25561 Pain in right knee: Secondary | ICD-10-CM | POA: Diagnosis not present

## 2020-04-05 NOTE — Telephone Encounter (Signed)
Pharmacy requests refill on: Rosuvastatin 5 mg   LAST REFILL: 04/21/2019 LAST OV: 02/16/2020 NEXT OV: 04/28/2020 PHARMACY: [facility]

## 2020-04-06 DIAGNOSIS — M25661 Stiffness of right knee, not elsewhere classified: Secondary | ICD-10-CM | POA: Diagnosis not present

## 2020-04-06 DIAGNOSIS — M25561 Pain in right knee: Secondary | ICD-10-CM | POA: Diagnosis not present

## 2020-04-06 DIAGNOSIS — Z96651 Presence of right artificial knee joint: Secondary | ICD-10-CM | POA: Diagnosis not present

## 2020-04-06 DIAGNOSIS — M25562 Pain in left knee: Secondary | ICD-10-CM | POA: Diagnosis not present

## 2020-04-09 DIAGNOSIS — M25562 Pain in left knee: Secondary | ICD-10-CM | POA: Diagnosis not present

## 2020-04-09 DIAGNOSIS — Z96651 Presence of right artificial knee joint: Secondary | ICD-10-CM | POA: Diagnosis not present

## 2020-04-09 DIAGNOSIS — M25561 Pain in right knee: Secondary | ICD-10-CM | POA: Diagnosis not present

## 2020-04-09 DIAGNOSIS — M25661 Stiffness of right knee, not elsewhere classified: Secondary | ICD-10-CM | POA: Diagnosis not present

## 2020-04-13 ENCOUNTER — Other Ambulatory Visit: Payer: Self-pay | Admitting: Family Medicine

## 2020-04-13 DIAGNOSIS — Z96651 Presence of right artificial knee joint: Secondary | ICD-10-CM | POA: Diagnosis not present

## 2020-04-13 DIAGNOSIS — M810 Age-related osteoporosis without current pathological fracture: Secondary | ICD-10-CM

## 2020-04-13 DIAGNOSIS — M25562 Pain in left knee: Secondary | ICD-10-CM | POA: Diagnosis not present

## 2020-04-13 DIAGNOSIS — M25561 Pain in right knee: Secondary | ICD-10-CM | POA: Diagnosis not present

## 2020-04-13 DIAGNOSIS — M25661 Stiffness of right knee, not elsewhere classified: Secondary | ICD-10-CM | POA: Diagnosis not present

## 2020-04-14 NOTE — Telephone Encounter (Signed)
Pharmacy requests refill on: Alendronate 70 mg   LAST REFILL: 01/20/2020 (Q-12, R-0) LAST OV: 02/16/2020 NEXT OV: 04/28/2020 PHARMACY: [facility]

## 2020-04-17 DIAGNOSIS — M25561 Pain in right knee: Secondary | ICD-10-CM | POA: Diagnosis not present

## 2020-04-17 DIAGNOSIS — Z96651 Presence of right artificial knee joint: Secondary | ICD-10-CM | POA: Diagnosis not present

## 2020-04-17 DIAGNOSIS — M25562 Pain in left knee: Secondary | ICD-10-CM | POA: Diagnosis not present

## 2020-04-17 DIAGNOSIS — M25661 Stiffness of right knee, not elsewhere classified: Secondary | ICD-10-CM | POA: Diagnosis not present

## 2020-04-21 ENCOUNTER — Other Ambulatory Visit: Payer: Self-pay

## 2020-04-21 ENCOUNTER — Ambulatory Visit (INDEPENDENT_AMBULATORY_CARE_PROVIDER_SITE_OTHER): Payer: Medicare Other | Admitting: Family Medicine

## 2020-04-21 ENCOUNTER — Encounter: Payer: Self-pay | Admitting: Family Medicine

## 2020-04-21 VITALS — BP 150/80 | HR 73 | Temp 97.1°F | Ht 64.0 in | Wt 158.8 lb

## 2020-04-21 DIAGNOSIS — M25661 Stiffness of right knee, not elsewhere classified: Secondary | ICD-10-CM | POA: Diagnosis not present

## 2020-04-21 DIAGNOSIS — M25561 Pain in right knee: Secondary | ICD-10-CM

## 2020-04-21 DIAGNOSIS — G8929 Other chronic pain: Secondary | ICD-10-CM | POA: Diagnosis not present

## 2020-04-21 DIAGNOSIS — M25562 Pain in left knee: Secondary | ICD-10-CM | POA: Diagnosis not present

## 2020-04-21 DIAGNOSIS — Z96651 Presence of right artificial knee joint: Secondary | ICD-10-CM | POA: Diagnosis not present

## 2020-04-21 DIAGNOSIS — M810 Age-related osteoporosis without current pathological fracture: Secondary | ICD-10-CM

## 2020-04-21 DIAGNOSIS — E785 Hyperlipidemia, unspecified: Secondary | ICD-10-CM

## 2020-04-21 NOTE — Patient Instructions (Signed)
Good to see you today  Please follow up with Dr. Einar Pheasant in 6 months  Keep up the good work with your rehab

## 2020-04-21 NOTE — Progress Notes (Signed)
   Subjective:    Patient ID: Sherry Lamb, female    DOB: Jul 25, 1934, 85 y.o.   MRN: [id number]  HPI Chief Complaint  Patient presents with  . Follow-up    X6 mos   This is an 85 yo female, who is accompanied by husband. She  has a past medical history of Arthritis, Frequent headaches, GERD (gastroesophageal reflux disease), Hyperlipidemia, Pneumonia, and PONV (postoperative nausea and vomiting).  Right knee replacement-has done okay after surgery, continues to do physical therapy.  Pain is manageable.  Increased range of motion and mobility.  Using a cane for ambulation.  Had a tooth pulled prior to surgery.  May have to have dental implants. Continues on alendronate. No s/s GERD/ esophagitis.   Hyperlipidemia- on rosuvastatin 5 mg. No myalgias.   Review of Systems No headache, visual change, chest pain, shortness of breath, abdominal pain, diarrhea/constipation, leg swelling (right knee with some mild swelling following total knee replacement)    Objective:   Physical Exam Physical Exam  Constitutional: Oriented to person, place, and time. Appears well-developed and well-nourished.  HENT:  Head: Normocephalic and atraumatic.  Eyes: Conjunctivae are normal.  Neck: Normal range of motion. Neck supple.  Cardiovascular: Normal rate, regular rhythm and normal heart sounds.   Pulmonary/Chest: Effort normal and breath sounds normal.  Musculoskeletal: No lower extremity edema.   Neurological: Alert and oriented to person, place, and time.  Skin: Skin is warm and dry.  Psychiatric: Normal mood and affect. Behavior is normal. Judgment and thought content normal.  Vitals reviewed.     BP (!) 150/80 (BP Location: Right Arm, Patient Position: Sitting, Cuff Size: Normal)   Pulse 73   Temp (!) 97.1 F (36.2 C) (Temporal)   Ht 5\' 4"  (1.626 m)   Wt 158 lb 12.8 oz (72 kg)   SpO2 99%   BMI 27.26 kg/m  Wt Readings from Last 3 Encounters:  04/21/20 158 lb 12.8 oz (72 kg)  02/23/20  149 lb 12 oz (67.9 kg)  02/16/20 149 lb 12 oz (67.9 kg)   BP Readings from Last 3 Encounters:  04/21/20 (!) 150/80  02/24/20 105/68  02/16/20 (!) 148/86       Assessment & Plan:  1. Bilateral chronic knee pain -Improved right knee pain following TKR.  Currently, left knee pain is manageable. -Continue follow-up with orthopedics  2. Hyperlipidemia, unspecified hyperlipidemia type -Well-controlled on rosuvastatin 5 mg, continue current dose, follow-up labs in 6 months  3. Age-related osteoporosis without current pathological fracture -Tolerating alendronate 70 mg weekly.  She is due for repeat bone density which has been ordered.  This visit occurred during the SARS-CoV-2 public health emergency.  Safety protocols were in place, including screening questions prior to the visit, additional usage of staff PPE, and extensive cleaning of exam room while observing appropriate contact time as indicated for disinfecting solutions.      Clarene Reamer, FNP-BC  [facility]  04/24/2020 4:26 PM

## 2020-04-22 ENCOUNTER — Ambulatory Visit (INDEPENDENT_AMBULATORY_CARE_PROVIDER_SITE_OTHER): Payer: Medicare Other | Admitting: Podiatry

## 2020-04-22 ENCOUNTER — Encounter: Payer: Self-pay | Admitting: Podiatry

## 2020-04-22 DIAGNOSIS — M79676 Pain in unspecified toe(s): Secondary | ICD-10-CM | POA: Diagnosis not present

## 2020-04-22 DIAGNOSIS — M778 Other enthesopathies, not elsewhere classified: Secondary | ICD-10-CM | POA: Diagnosis not present

## 2020-04-22 DIAGNOSIS — B351 Tinea unguium: Secondary | ICD-10-CM

## 2020-04-22 NOTE — Progress Notes (Signed)
Subjective:  Patient ID: Sherry Lamb, female    DOB: [birth year],  MRN: [id number]  Chief Complaint  Patient presents with  . Nail Problem    Nail trim RFC   Having pain in right foot from her arthritis    85 y.o. female presents with the above complaint. Patient presents with complaint of right lateral ankle sprain/capsulitis. Patient states the injection helped considerably. Her pain is now 80%. She still has some residual pain present. Patient does have a right knee replacement as well. She has secondary complaint of thickened elongated dystrophic toenails x10 painful to touch she would like to have them to be redone. She denies any other acute complaints.   Review of Systems: Negative except as noted in the HPI. Denies N/V/F/Ch.  Past Medical History:  Diagnosis Date  . Arthritis   . Frequent headaches   . GERD (gastroesophageal reflux disease)   . Hyperlipidemia   . Pneumonia   . PONV (postoperative nausea and vomiting)     Current Outpatient Medications:  .  alendronate (FOSAMAX) 70 MG tablet, TAKE 1 TABLET EVERY 7 DAYS WITH A FULL GLASS OF WATER ON AN EMPTY STOMACH, Disp: 12 tablet, Rfl: 1 .  Biotin 5 MG CAPS, Take 5 mg by mouth daily., Disp: , Rfl:  .  Cholecalciferol (VITAMIN D) 50 MCG (2000 UT) CAPS, Take 4,000 Units by mouth daily., Disp: , Rfl:  .  Histamine Dihydrochloride (AUSTRALIAN DREAM ARTHRITIS EX), Apply 1 application topically daily as needed (pain)., Disp: , Rfl:  .  HYDROmorphone (DILAUDID) 2 MG tablet, Take 1-2 tablets (2-4 mg total) by mouth every 6 (six) hours as needed for severe pain., Disp: 42 tablet, Rfl: 0 .  methocarbamol (ROBAXIN) 500 MG tablet, Take 1 tablet (500 mg total) by mouth every 6 (six) hours as needed for muscle spasms., Disp: 40 tablet, Rfl: 0 .  Polyvinyl Alcohol-Povidone (REFRESH OP), Place 1 drop into both eyes 2 (two) times daily., Disp: , Rfl:  .  rosuvastatin (CRESTOR) 5 MG tablet, TAKE 1 TABLET DAILY, Disp: 90 tablet, Rfl: 1 .   traMADol (ULTRAM) 50 MG tablet, Take 1-2 tablets (50-100 mg total) by mouth every 6 (six) hours as needed for moderate pain., Disp: 40 tablet, Rfl: 0  Social History   Tobacco Use  Smoking Status Never Smoker  Smokeless Tobacco Never Used    Allergies  Allergen Reactions  . Penicillins Other (See Comments)    Stomach tightness.  Tolerated Cephalosporin Date: 02/24/20.    . Prednisone Other (See Comments)    GI intolerance   Objective:  There were no vitals filed for this visit. There is no height or weight on file to calculate BMI. Constitutional Well developed. Well nourished.  Vascular Dorsalis pedis pulses palpable bilaterally. Posterior tibial pulses palpable bilaterally. Capillary refill normal to all digits.  No cyanosis or clubbing noted. Pedal hair growth normal.  Neurologic Normal speech. Oriented to person, place, and time. Epicritic sensation to light touch grossly present bilaterally.  Dermatologic Nail Exam: Pt has thick disfigured discolored nails with subungual debris noted bilateral entire nail hallux through fifth toenails.  Pain on palpation to the nails. No open wounds. No skin lesions.  Orthopedic:  Pain on palpation to the right lateral foot at the level of the ATFL ligament.  No pain at the posterior tibial tendon, posterior tibial tendon, peroneal tendon, Achilles tendon.  Mild pain with dorsiflexion of the ankle joint.  Deep intra-articular pain noted.  Pain at the lateral gutter of  the ankle joint.  No pain at the medial gutter.   Radiographs: 3 views of skeletally mature adult right ankle: Arthritic changes noted of the ankle mild to moderate in nature.  Previous hardware noted appears to be intact.  Mild loosening of the medial malleolus screw noted.  Again no protrusion noted.  Uneven joint space narrowing of the ankle joint noted.  Posterior heel spurring noted.  No other bony abnormalities identified Assessment:   1. Pain due to onychomycosis of  toenail   2. Capsulitis of foot, right    Plan:  Patient was evaluated and treated and all questions answered.  Right lateral ankle capsulitis/arthritis pain -I explained to the patient the etiology of arthritis and various treatment options were discussed.  Given the location of the pain I believe patient will benefit from a steroid injection to help decrease acute inflammatory component associated pain.  Patient refuses to wear the boot therefore I believe patient will benefit from Tri-Lock ankle brace to give stability with ambulation.  If there is no improvement with the injection and Tri-Lock ankle brace we will plan on putting her in a cam boot.  Patient states understanding.  Also believe patient will benefit for the steroid injection.  Steroid injection was given as directed. -A second steroid injection was performed at right lateral foot using 1% plain Lidocaine and 10 mg of Kenalog. This was well tolerated.  Onychomycosis with pain  -Nails palliatively debrided as below. -Educated on self-care  Procedure: Nail Debridement Rationale: pain  Type of Debridement: manual, sharp debridement. Instrumentation: Nail nipper, rotary burr. Number of Nails: 10  Procedures and Treatment: Consent by patient was obtained for treatment procedures. The patient understood the discussion of treatment and procedures well. All questions were answered thoroughly reviewed. Debridement of mycotic and hypertrophic toenails, 1 through 5 bilateral and clearing of subungual debris. No ulceration, no infection noted.  Return Visit-Office Procedure: Patient instructed to return to the office for a follow up visit 3 months for continued evaluation and treatment.  Boneta Lucks, DPM    No follow-ups on file.    No follow-ups on file.

## 2020-04-24 ENCOUNTER — Encounter: Payer: Self-pay | Admitting: Family Medicine

## 2020-04-24 DIAGNOSIS — M25561 Pain in right knee: Secondary | ICD-10-CM | POA: Diagnosis not present

## 2020-04-24 DIAGNOSIS — M25562 Pain in left knee: Secondary | ICD-10-CM | POA: Diagnosis not present

## 2020-04-24 DIAGNOSIS — M25661 Stiffness of right knee, not elsewhere classified: Secondary | ICD-10-CM | POA: Diagnosis not present

## 2020-04-24 DIAGNOSIS — Z96651 Presence of right artificial knee joint: Secondary | ICD-10-CM | POA: Diagnosis not present

## 2020-04-27 ENCOUNTER — Ambulatory Visit: Payer: Medicare Other | Admitting: Podiatry

## 2020-04-28 ENCOUNTER — Ambulatory Visit: Payer: Medicare Other | Admitting: Family Medicine

## 2020-04-29 DIAGNOSIS — Z96651 Presence of right artificial knee joint: Secondary | ICD-10-CM | POA: Diagnosis not present

## 2020-04-29 DIAGNOSIS — M25562 Pain in left knee: Secondary | ICD-10-CM | POA: Diagnosis not present

## 2020-04-29 DIAGNOSIS — M25661 Stiffness of right knee, not elsewhere classified: Secondary | ICD-10-CM | POA: Diagnosis not present

## 2020-04-29 DIAGNOSIS — M25561 Pain in right knee: Secondary | ICD-10-CM | POA: Diagnosis not present

## 2020-05-04 DIAGNOSIS — M25562 Pain in left knee: Secondary | ICD-10-CM | POA: Diagnosis not present

## 2020-05-04 DIAGNOSIS — M25661 Stiffness of right knee, not elsewhere classified: Secondary | ICD-10-CM | POA: Diagnosis not present

## 2020-05-04 DIAGNOSIS — M25561 Pain in right knee: Secondary | ICD-10-CM | POA: Diagnosis not present

## 2020-05-04 DIAGNOSIS — Z96651 Presence of right artificial knee joint: Secondary | ICD-10-CM | POA: Diagnosis not present

## 2020-07-07 ENCOUNTER — Encounter: Payer: Self-pay | Admitting: Family Medicine

## 2020-07-07 ENCOUNTER — Other Ambulatory Visit: Payer: Self-pay

## 2020-07-07 ENCOUNTER — Ambulatory Visit (INDEPENDENT_AMBULATORY_CARE_PROVIDER_SITE_OTHER): Payer: Medicare Other | Admitting: Family Medicine

## 2020-07-07 VITALS — BP 118/78 | HR 81 | Temp 97.8°F | Wt 147.0 lb

## 2020-07-07 DIAGNOSIS — M810 Age-related osteoporosis without current pathological fracture: Secondary | ICD-10-CM | POA: Insufficient documentation

## 2020-07-07 DIAGNOSIS — M5136 Other intervertebral disc degeneration, lumbar region: Secondary | ICD-10-CM | POA: Diagnosis not present

## 2020-07-07 DIAGNOSIS — E785 Hyperlipidemia, unspecified: Secondary | ICD-10-CM | POA: Diagnosis not present

## 2020-07-07 DIAGNOSIS — M170 Bilateral primary osteoarthritis of knee: Secondary | ICD-10-CM | POA: Diagnosis not present

## 2020-07-07 NOTE — Patient Instructions (Addendum)
#  Back pain/buttock pain - would recommend considering physical therapy  #Knee pain - do physical therapy exercises at least 3 times per week  Sleep - try Melatonin 3-5 mg  Sleep hygiene checklist: 1. Avoid naps during the day 2. Avoid stimulants such as caffeine and nicotine. Avoid bedtime alcohol (it can speed onset of sleep but the body's metabolism can cause awakenings). At least 2 hours before bedtime 3. All forms of exercise help ensure sound sleep - limit vigorous exercise to morning or late afternoon 4. Avoid food too close to bedtime including chocolate (which contains caffeine) 5. Soak up natural light 6. Establish regular bedtime routine. 7. Associate bed with sleep - avoid TV, computer or phone, reading while in bed. 8. Ensure pleasant, relaxing sleep environment - quiet, dark, cool room.  Good Sleep Hygiene Habits -- Got to bed and wake up within an hour of the same time every day -- Avoid bright screens (from laptop, phone, TV) within at least 30 minutes before bed. The "blue light" supresses the sleep hormone melatonin and the content may stimulate as well -- Maintain a quiet and dark sleep environment (blackout curtains, turn on a fan or white noise to block out disruptive sounds) -- Practicing relaxing activites before bed (taking a shower, reading a book, journaling, meditation app) -- To quiet a busy mind -- consider journaling before bed (jotting down reminders, worry thoughts, as well as positive things like a gratitude list)   Begin a Mindfulness/Meditation practice -- this can take a little as 3 minutes -- You can find resources in books -- Or you can download apps like  ---- Headspace App (which currently has free content called "Weathering the Storm") ---- Calm (which has a few free options)  ---- Insignt Timer ---- Stop, Breathe & Think  # With each of these Apps - you should decline the "start free trial" offer and as you search through the App should be  able to access some of their free content. You can also chose to pay for the content if you find one that works well for you.   # Many of them also offer sleep specific content which may help with insomnia

## 2020-07-07 NOTE — Assessment & Plan Note (Signed)
Cont crestor 5 mg

## 2020-07-07 NOTE — Assessment & Plan Note (Signed)
S/p R knee replacement with left knee pain 2/2 to osteoarthritis. Not interested in injections at this time. Encouraged her to do previous PT exercises and reach out if wanting injection. Trial of Tumeric for pain

## 2020-07-07 NOTE — Assessment & Plan Note (Signed)
Is on a second round of fosamax per report. Discussed getting Dexa and pending results will plan for endocrine referral to discuss alternatives as discussed fosamax meant to be used for 5 years. Will continue for now, but anticipate medication change after dexa

## 2020-07-07 NOTE — Assessment & Plan Note (Signed)
Pt with low back pain following fall. No bony tenderness. Home exercise for back pain provided. She will contact if she wants to try PT.

## 2020-07-07 NOTE — Progress Notes (Signed)
Subjective:     Ernisha Sorn is a 86 y.o. female presenting for Transitions Of Care     HPI   #knee replacement - is doing PT exercises occasionally on the right and left - bad experience and difficulty recovering from the right knee replacement  #Insomnia - continues to feel tired - physically  - does not sleep well - wakes up every 2 hours, only 1 time nocturia - tried OTC - zzzquil which made her stomach hurt    #fall - christmas day - fell on the left side of the back/hip/buttock - treatment - aleve, tylenol extra strength - worse with laying down and going to bed - pain is achy - no loss of control of bowl or bladder - has numbness on the right side knee placement - no tingling, numbness, weakness in the leg    Review of Systems   Social History   Tobacco Use  Smoking Status Never Smoker  Smokeless Tobacco Never Used        Objective:    BP Readings from Last 3 Encounters:  07/07/20 118/78  04/21/20 (!) 150/80  02/24/20 105/68   Wt Readings from Last 3 Encounters:  07/07/20 147 lb (66.7 kg)  04/21/20 158 lb 12.8 oz (72 kg)  02/23/20 149 lb 12 oz (67.9 kg)    BP 118/78   Pulse 81   Temp 97.8 F (36.6 C) (Temporal)   Wt 147 lb (66.7 kg)   SpO2 97%   BMI 25.23 kg/m    Physical Exam Constitutional:      General: She is not in acute distress.    Appearance: She is well-developed. She is not diaphoretic.  HENT:     Right Ear: External ear normal.     Left Ear: External ear normal.     Nose: Nose normal.  Eyes:     Conjunctiva/sclera: Conjunctivae normal.  Cardiovascular:     Rate and Rhythm: Normal rate and regular rhythm.  Pulmonary:     Effort: Pulmonary effort is normal.     Breath sounds: Normal breath sounds.  Musculoskeletal:     Cervical back: Neck supple.     Comments: Back Inspection: no abnormalities Palpation: No spinous or paraspinous ttp ROM: normal, some pain with flexion in the left buttock area Strength:  normal Straight leg: negative  Left knee Inspection: no swelling  Palpation: lateral joint line ttp ROM: normal Strength: normal  Skin:    General: Skin is warm and dry.     Capillary Refill: Capillary refill takes less than 2 seconds.  Neurological:     Mental Status: She is alert. Mental status is at baseline.  Psychiatric:        Mood and Affect: Mood normal.        Behavior: Behavior normal.       Lab Results  Component Value Date   CHOL 179 10/21/2019   HDL 84.50 10/21/2019   LDLCALC 79 10/21/2019   TRIG 77.0 10/21/2019   CHOLHDL 2 10/21/2019       Assessment & Plan:   Problem List Items Addressed This Visit      Musculoskeletal and Integument   Osteoarthritis of knee    S/p R knee replacement with left knee pain 2/2 to osteoarthritis. Not interested in injections at this time. Encouraged her to do previous PT exercises and reach out if wanting injection. Trial of Tumeric for pain      Relevant Medications   aspirin 81 MG EC  tablet   Degeneration of lumbar intervertebral disc    Pt with low back pain following fall. No bony tenderness. Home exercise for back pain provided. She will contact if she wants to try PT.       Relevant Medications   aspirin 81 MG EC tablet   Osteoporosis - Primary    Is on a second round of fosamax per report. Discussed getting Dexa and pending results will plan for endocrine referral to discuss alternatives as discussed fosamax meant to be used for 5 years. Will continue for now, but anticipate medication change after dexa      Relevant Orders   DG Bone Density     Other   Hyperlipidemia    Cont crestor 5 mg      Relevant Medications   aspirin 81 MG EC tablet       Return in about 6 months (around 01/07/2021).  Lesleigh Noe, MD  This visit occurred during the SARS-CoV-2 public health emergency.  Safety protocols were in place, including screening questions prior to the visit, additional usage of staff PPE, and  extensive cleaning of exam room while observing appropriate contact time as indicated for disinfecting solutions.

## 2020-07-19 ENCOUNTER — Other Ambulatory Visit: Payer: Self-pay

## 2020-07-19 ENCOUNTER — Ambulatory Visit
Admission: RE | Admit: 2020-07-19 | Discharge: 2020-07-19 | Disposition: A | Discharge status: Home / Self Care | Payer: Medicare Other | Source: Ambulatory Visit | Attending: Family Medicine | Admitting: Family Medicine

## 2020-07-19 DIAGNOSIS — M810 Age-related osteoporosis without current pathological fracture: Secondary | ICD-10-CM | POA: Diagnosis not present

## 2020-07-19 DIAGNOSIS — Z780 Asymptomatic menopausal state: Secondary | ICD-10-CM | POA: Diagnosis not present

## 2020-07-20 ENCOUNTER — Telehealth: Payer: Self-pay | Admitting: Family Medicine

## 2020-07-20 DIAGNOSIS — M810 Age-related osteoporosis without current pathological fracture: Secondary | ICD-10-CM

## 2020-07-20 NOTE — Telephone Encounter (Signed)
Attempted to call home and cell and left voicemail to call.   Routing to MA to f/u and if pt returns call  Calling about recent DEXA scan.   Osteoporosis most significant in forearm. I am placing a referral to endocrine as I think she would benefit from a medication change and discussion with them.

## 2020-07-20 NOTE — Telephone Encounter (Signed)
Patient returned call. She can be reached at  [phone].

## 2020-07-21 ENCOUNTER — Other Ambulatory Visit: Payer: Self-pay

## 2020-07-21 ENCOUNTER — Encounter: Payer: Self-pay | Admitting: Podiatry

## 2020-07-21 ENCOUNTER — Ambulatory Visit (INDEPENDENT_AMBULATORY_CARE_PROVIDER_SITE_OTHER): Payer: Medicare Other | Admitting: Podiatry

## 2020-07-21 DIAGNOSIS — M79674 Pain in right toe(s): Secondary | ICD-10-CM | POA: Diagnosis not present

## 2020-07-21 DIAGNOSIS — M79675 Pain in left toe(s): Secondary | ICD-10-CM

## 2020-07-21 DIAGNOSIS — B351 Tinea unguium: Secondary | ICD-10-CM

## 2020-07-21 NOTE — Telephone Encounter (Signed)
Spoke to pt and relayed message from Dr. Einar Pheasant. Pt had several questions and would like a call back from Dr. Einar Pheasant as soon as she can.

## 2020-07-22 ENCOUNTER — Ambulatory Visit: Payer: Medicare Other | Admitting: Podiatry

## 2020-07-22 NOTE — Telephone Encounter (Signed)
Patient called and said she missed 2 calls.  Patient said she can be reached at [phone]. Patient said she'll be home until 5:30.

## 2020-07-22 NOTE — Progress Notes (Signed)
  Subjective:  Patient ID: Sherry Lamb, female    DOB: 03-Sep-1934,  MRN: [id number]  Chief Complaint  Patient presents with  . Nail Problem    Nail trim RFC    86 y.o. female presents with the above complaint. History confirmed with patient.  Her nails are thickened and elongated and painful.  She says they become this way after nearly 2 weeks.  Objective:  Physical Exam: warm, good capillary refill, no trophic changes or ulcerative lesions, normal DP and PT pulses and normal sensory exam.  She has thickened and elongated yellow toenails x10 with subungual debris Assessment:   1. Pain due to onychomycosis of toenails of both feet      Plan:  Patient was evaluated and treated and all questions answered.   Discussed the etiology and treatment options for the condition in detail with the patient. Educated patient on the topical and oral treatment options for mycotic nails. Recommended debridement of the nails today. Sharp and mechanical debridement performed of all painful and mycotic nails today. Nails debrided in length and thickness using a nail nipper to level of comfort. Discussed treatment options including appropriate shoe gear. Follow up as needed for painful nails.  I recommend between visits she should consider having pedicures completed to allow the nails to be reduced in thickness but would not want them to dug into the corners or pushed back cuticles to cause infection  Return in about 3 months (around 10/21/2020) for painful nails.

## 2020-07-22 NOTE — Telephone Encounter (Signed)
Answered questions  There was some confusion about the referral.   Discussed importance of second opinion and she has appt scheduled.   She will stop fosamax leading up to the office visit.

## 2020-09-09 ENCOUNTER — Ambulatory Visit: Payer: Medicare Other | Admitting: Endocrinology

## 2020-09-17 NOTE — Telephone Encounter (Signed)
Pt was to see endo 09/09/20 but pt could not keep appt; but pt has appt with Dr Loanne Drilling on 10/25/20. Pt said she has stopped fosamax because was told she could. Pt received [facility] from [facility] to be [facility] on 09/29/20. Pt wants to know what to do. Pt has been off fosamax for over 1 month and pt wants to know if our office will cancel the fosamax order or will pt need to do that. Pt request cb next wk. Sending note to [facility] CMA.

## 2020-09-20 DIAGNOSIS — M1711 Unilateral primary osteoarthritis, right knee: Secondary | ICD-10-CM | POA: Diagnosis not present

## 2020-09-20 DIAGNOSIS — M7062 Trochanteric bursitis, left hip: Secondary | ICD-10-CM | POA: Diagnosis not present

## 2020-09-20 DIAGNOSIS — M25562 Pain in left knee: Secondary | ICD-10-CM | POA: Diagnosis not present

## 2020-09-20 NOTE — Telephone Encounter (Signed)
Called [facility] and per rep RX for Fosamax with them is expired and will not be filled. Advised rep we wanted to make sure it would not be processed. I called patient and left her a message letting her know this

## 2020-09-30 ENCOUNTER — Other Ambulatory Visit: Payer: Self-pay

## 2020-09-30 MED ORDER — ROSUVASTATIN CALCIUM 5 MG PO TABS
5.0000 mg | ORAL_TABLET | Freq: Every day | ORAL | 1 refills | Status: DC
Start: 2020-09-30 — End: 2021-03-22

## 2020-10-20 ENCOUNTER — Ambulatory Visit: Payer: Medicare Other

## 2020-10-20 DIAGNOSIS — M7062 Trochanteric bursitis, left hip: Secondary | ICD-10-CM | POA: Diagnosis not present

## 2020-10-20 DIAGNOSIS — M25562 Pain in left knee: Secondary | ICD-10-CM | POA: Diagnosis not present

## 2020-10-25 ENCOUNTER — Encounter: Payer: Self-pay | Admitting: Endocrinology

## 2020-10-25 ENCOUNTER — Ambulatory Visit (INDEPENDENT_AMBULATORY_CARE_PROVIDER_SITE_OTHER): Payer: Medicare Other | Admitting: Endocrinology

## 2020-10-25 ENCOUNTER — Telehealth: Payer: Self-pay | Admitting: Endocrinology

## 2020-10-25 ENCOUNTER — Telehealth: Payer: Self-pay

## 2020-10-25 ENCOUNTER — Other Ambulatory Visit: Payer: Self-pay

## 2020-10-25 VITALS — BP 118/60 | HR 78 | Ht 64.0 in | Wt 142.0 lb

## 2020-10-25 DIAGNOSIS — M810 Age-related osteoporosis without current pathological fracture: Secondary | ICD-10-CM

## 2020-10-25 DIAGNOSIS — R634 Abnormal weight loss: Secondary | ICD-10-CM | POA: Insufficient documentation

## 2020-10-25 LAB — VITAMIN D 25 HYDROXY (VIT D DEFICIENCY, FRACTURES): VITD: 84.41 ng/mL (ref 30.00–100.00)

## 2020-10-25 LAB — T4, FREE: Free T4: 1.09 ng/dL (ref 0.60–1.60)

## 2020-10-25 LAB — TSH: TSH: 1.25 u[IU]/mL (ref 0.35–4.50)

## 2020-10-25 NOTE — Telephone Encounter (Signed)
Please do PA for Prolia.  TY

## 2020-10-25 NOTE — Telephone Encounter (Signed)
Would recommend consistent warm compress  Wet a clean washcloth with warm water and put it over your stye. When the washcloth cools, reheat it with warm water and put it back over the stye.   Repeat these steps for about 15 minutes, and do this 4times a day.  You should not squeeze or try to pop your stye. This can make it worse. Also, you should notwear eye makeup or contact lenses until your stye is all better.  Schedule appointment with any provider for Wednesday if not improved

## 2020-10-25 NOTE — Telephone Encounter (Signed)
Patient called stating she has had stye on upper right eyelid, started 2 weeks ago. She has been applying hot compress-but not consistent. She has used OTC Stye sterile lubricant eye ointment also. Stye is not improving. No redness, no drainage present. No blurred vision, no fever.  Recommendations on what patient should do at this time? No appointments until Wednesday. Please review.

## 2020-10-25 NOTE — Telephone Encounter (Signed)
Spoke to Sherry Lamb and relayed Dr. Verda Cumins message. Sherry Lamb stated understanding and will call office tomorrow if she doesn't see an improvement.

## 2020-10-25 NOTE — Patient Instructions (Addendum)
Blood tests are requested for you today.  We'll let you know about the results.   We are requesting Prolia from your insurance.  you will receive a phone call, about a day and time for an appointment.   Please come back for a follow-up appointment in 9 months.

## 2020-10-25 NOTE — Progress Notes (Signed)
Subjective:    Patient ID: Sherry Lamb, female    DOB: 04-19-1935, 86 y.o.   MRN: [id number]  HPI Pt is referred by Dr Einar Pheasant, for osteoporosis.  Pt was noted to have osteoporosis in 2002.  She took Fosamax 2002-2012, and 2019-2022.  she has had these bony fractures: right ankle (2012).  She has no history of any of the following: early menopause, multiple myeloma, renal dz, thyroid problems, steroids, alcoholism, smoking, liver dz, gastric bypass, and hyperparathyroidism.  She does not take heparin or anticonvulsants.  She takes Vit-D, 4000 units/d. She has lost a few lbs--intentional.  She has intermitt falls.   Past Medical History:  Diagnosis Date   Arthritis    Frequent headaches    GERD (gastroesophageal reflux disease)    Hyperlipidemia    Pneumonia    PONV (postoperative nausea and vomiting)     Past Surgical History:  Procedure Laterality Date   ABDOMINAL HYSTERECTOMY     ANKLE SURGERY     EYE SURGERY     Left hip replacement Left 2013   right hip replacement Right 2000   TONSILLECTOMY AND ADENOIDECTOMY  1943   TOTAL KNEE ARTHROPLASTY Right 02/23/2020   Procedure: TOTAL KNEE ARTHROPLASTY;  Surgeon: Gaynelle Arabian, MD;  Location: [facility];  Service: Orthopedics;  Laterality: Right;  55mn   WISDOM TOOTH EXTRACTION      Social History   Socioeconomic History   Marital status: Married    Spouse name: RHerbie Baltimore  Number of children: 1   Years of education: secretarial school   Highest education level: Not on file  Occupational History   Not on file  Tobacco Use   Smoking status: Never   Smokeless tobacco: Never  Vaping Use   Vaping Use: Never used  Substance and Sexual Activity   Alcohol use: Yes    Alcohol/week: 1.0 standard drink    Types: 1 Glasses of wine per week    Comment: 1 wine day    Drug use: Never   Sexual activity: Yes    Partners: Male    Birth control/protection: Post-menopausal, Surgical    Comment: Hysterectomy  Other Topics Concern   Not on  file  Social History Narrative   07/07/20   From: [place] be near family   Living: with husband RHerbie [place](1966)   Work: retired from music business - [employer]      Family: daughter -Anderson [place]- no children      Enjoys: play tennis (though cannot play), reading, music      Exercise: walking, tries to do her knee therapy   Diet: generally      Safety   Seat belts: Yes    Guns: No   Safe in relationships: Yes    Social Determinants of HRadio broadcast assistantStrain: Not on file  Food Insecurity: Not on file  Transportation Needs: Not on file  Physical Activity: Not on file  Stress: Not on file  Social Connections: Not on file  Intimate Partner Violence: Not on file    Current Outpatient Medications on File Prior to Visit  Medication Sig Dispense Refill   alendronate (FOSAMAX) 70 MG tablet TAKE 1 TABLET EVERY 7 DAYS WITH A FULL GLASS OF WATER ON AN EMPTY STOMACH 12 tablet 1   aspirin 81 MG EC tablet Take 81 mg by mouth daily. Swallow whole.     Biotin 5 MG CAPS Take 5 mg by mouth daily.  Cholecalciferol (VITAMIN D) 50 MCG (2000 UT) CAPS Take 4,000 Units by mouth daily.     Histamine Dihydrochloride (AUSTRALIAN DREAM ARTHRITIS EX) Apply 1 application topically daily as needed (pain).     Polyvinyl Alcohol-Povidone (REFRESH OP) Place 1 drop into both eyes 2 (two) times daily.     rosuvastatin (CRESTOR) 5 MG tablet Take 1 tablet (5 mg total) by mouth daily. 90 tablet 1   No current facility-administered medications on file prior to visit.    Allergies  Allergen Reactions   Penicillins Other (See Comments)    Stomach tightness.  Tolerated Cephalosporin Date: 02/24/20.     Prednisone Other (See Comments)    GI intolerance    Family History  Problem Relation Age of Onset   Arthritis Mother    Asthma Mother    Hearing loss Mother    Osteoporosis Mother    Heart disease Father    Hyperlipidemia Father    Kidney disease Father     Depression Brother    Hyperlipidemia Brother    Arrhythmia Brother        Psychologist, forensic   Breast cancer Neg Hx     BP 118/60   Pulse 78   Ht 5' 4"  (1.626 m)   Wt 142 lb (64.4 kg)   SpO2 97%   BMI 24.37 kg/m     Review of Systems denies cold intolerance, memory loss, and back pain.  She has intermitt leg cramps and back pain.    Objective:   Physical Exam GENERAL: no distress NECK: There is no palpable thyroid enlargement.  No thyroid nodule is palpable.  No palpable lymphadenopathy at the anterior neck.   SPINE: no kyphosis.   GAIT: normal and steady.   Lab Results  Component Value Date   CREATININE 0.55 02/24/2020   BUN 18 02/24/2020   NA 137 02/24/2020   K 4.0 02/24/2020   CL 104 02/24/2020   CO2 24 02/24/2020    Lab Results  Component Value Date   ALT 19 02/13/2020   AST 26 02/13/2020   ALKPHOS 47 02/13/2020   BILITOT 0.9 02/13/2020   Lab Results  Component Value Date   CALCIUM 8.4 (L) 02/24/2020   DEXA: Forearm Radius: T-score of -3.8.  I have reviewed outside records, and summarized: Pt was noted to have low BMD, and referred here.  Dyslipidemia and OA were also addressed.    Assessment & Plan:  Osteoporosis, new to me, uncontrolled  Patient Instructions  Blood tests are requested for you today.  We'll let you know about the results.   We are requesting Prolia from your insurance.  you will receive a phone call, about a day and time for an appointment.   Please come back for a follow-up appointment in 9 months.

## 2020-10-27 ENCOUNTER — Ambulatory Visit: Payer: Medicare Other | Admitting: Podiatry

## 2020-10-27 LAB — PTH, INTACT AND CALCIUM
Calcium: 9.9 mg/dL (ref 8.6–10.4)
PTH: 65 pg/mL (ref 16–77)

## 2020-10-27 LAB — PROTEIN ELECTROPHORESIS, SERUM
Albumin ELP: 4.1 g/dL (ref 3.8–4.8)
Alpha 1: 0.3 g/dL (ref 0.2–0.3)
Alpha 2: 0.8 g/dL (ref 0.5–0.9)
Beta 2: 0.4 g/dL (ref 0.2–0.5)
Beta Globulin: 0.5 g/dL (ref 0.4–0.6)
Gamma Globulin: 1 g/dL (ref 0.8–1.7)
Total Protein: 7.1 g/dL (ref 6.1–8.1)

## 2020-10-27 NOTE — Telephone Encounter (Signed)
She needs an appointment to be evaluated.   Should see any provider available as soon as able and willing to be come in for office visit.   If she has an eye doctor she can also reach to them for scheduling. But will not place a referral without an office visit here first for evaluation.

## 2020-10-27 NOTE — Telephone Encounter (Signed)
Spoke to pt told her Dr. Einar Pheasant said she needs to be seen to evaluate stye on eye. Pt verbalized understanding. Asked pt if she is available tomorrow? Pt said yes. Told her one of the schedulers will call her back to schedule. Pt verbalized understanding.

## 2020-10-27 NOTE — Telephone Encounter (Signed)
Patient called the office stating that she still has the stye on her eye. Patient stated that she has been doing the home treatment recommended Patient stated that the stye may be a little smaller but not much better. Patient stated that she was told to call the office today for an appointment. Patient was advised that we do not have any appointments available here today. Patient was offered an appointment today at another office which she declined. Patient stated that she had an appointment scheduled today with a podiatrist and had to cancel it because she had some diarrhea this morning. Patient wanted to know if the stye could be causing her diarrhea. Patient wants to know about having the stye lanced. Patient wants to know if she comes into the office if someone will lance the stye. Patient stated that her husband now has a stye on his eye and he went to his eye doctor today and he was advised to do the same treatment that she has been doing. Patient stated that she has had this stye for about 3 weeks and wants treatment to get rid of it. Patient requested a call back as to what the next step would be.  [facility]

## 2020-10-28 ENCOUNTER — Ambulatory Visit: Payer: Medicare Other | Admitting: Family Medicine

## 2020-10-28 ENCOUNTER — Telehealth: Payer: Self-pay

## 2020-10-28 NOTE — Telephone Encounter (Signed)
[place] Night - Client Nonclinical Telephone Record AccessNurse Client [place] Night - Client Client Site [place] Physician Owens Loffler - MD Contact Type Call Who Is Calling Patient / Member / Family / Caregiver Caller Name Pairlee [place] Phone Number [phone] Patient Name Sherry Lamb Patient DOB [birth year] Call Type Message Only Information Provided Reason for Call Request to [place] Appointment Initial Comment Cancel her appt for today at 10am Patient request to speak to RN No Disp. Time Disposition Final User 10/28/2020 7:44:43 AM General Information Provided Yes Donato Heinz Call Closed By: Donato Heinz Transaction Date/Time: 10/28/2020 7:42:35 AM (ET)

## 2020-10-28 NOTE — Progress Notes (Unsigned)
    Sherry Meskill T. Erdine Hulen, MD, [place] at [facility] [place], [address]  Phone: [phone]  FAX: [phone]  Sherry Lamb - 86 y.o. female  MRN [id number]  Date of Birth: Aug 18, 1934  Date: 10/28/2020  PCP: Lesleigh Noe, MD  Referral: Lesleigh Noe, MD  No chief complaint on file.   This visit occurred during the SARS-CoV-2 public health emergency.  Safety protocols were in place, including screening questions prior to the visit, additional usage of staff PPE, and extensive cleaning of exam room while observing appropriate contact time as indicated for disinfecting solutions.   Subjective:   Sherry Lamb is a 86 y.o. very pleasant female patient with There is no height or weight on file to calculate BMI. who presents with the following:  She presents with a right-sided stye.    Review of Systems is noted in the HPI, as appropriate  Objective:   There were no vitals taken for this visit.  GEN: No acute distress; alert,appropriate. PULM: Breathing comfortably in no respiratory distress PSYCH: Normally interactive.   Laboratory and Imaging Data:  Assessment and Plan:

## 2020-10-29 DIAGNOSIS — M1711 Unilateral primary osteoarthritis, right knee: Secondary | ICD-10-CM | POA: Diagnosis not present

## 2020-11-01 ENCOUNTER — Ambulatory Visit (INDEPENDENT_AMBULATORY_CARE_PROVIDER_SITE_OTHER): Payer: Medicare Other | Admitting: Podiatry

## 2020-11-01 ENCOUNTER — Encounter: Payer: Self-pay | Admitting: Podiatry

## 2020-11-01 ENCOUNTER — Other Ambulatory Visit: Payer: Self-pay

## 2020-11-01 DIAGNOSIS — H00021 Hordeolum internum right upper eyelid: Secondary | ICD-10-CM | POA: Diagnosis not present

## 2020-11-01 DIAGNOSIS — M79675 Pain in left toe(s): Secondary | ICD-10-CM | POA: Diagnosis not present

## 2020-11-01 DIAGNOSIS — B351 Tinea unguium: Secondary | ICD-10-CM | POA: Diagnosis not present

## 2020-11-01 DIAGNOSIS — M79674 Pain in right toe(s): Secondary | ICD-10-CM

## 2020-11-01 NOTE — Progress Notes (Signed)
  Subjective:  Patient ID: Sherry Lamb, female    DOB: 12/09/1934,  MRN: [id number]  Chief Complaint  Patient presents with   Nail Problem    Thick painful toenails, 3 month follow up    86 y.o. female presents with the above complaint. History confirmed with patient.  Her nails are thickened and elongated and painful.  She says they become this way after nearly 2 weeks.  Objective:  Physical Exam: warm, good capillary refill, no trophic changes or ulcerative lesions, normal DP and PT pulses and normal sensory exam.  She has thickened and elongated yellow toenails x10 with subungual debris Assessment:   1. Pain due to onychomycosis of toenails of both feet      Plan:  Patient was evaluated and treated and all questions answered.   Discussed the etiology and treatment options for the condition in detail with the patient. Educated patient on the topical and oral treatment options for mycotic nails. Recommended debridement of the nails today. Sharp and mechanical debridement performed of all painful and mycotic nails today. Nails debrided in length and thickness using a nail nipper to level of comfort. Discussed treatment options including appropriate shoe gear. Follow up as needed for painful nails.    No follow-ups on file.

## 2020-11-02 NOTE — Telephone Encounter (Signed)
Prolia VOB initiated via [email or web address] 

## 2020-11-03 ENCOUNTER — Telehealth: Payer: Self-pay | Admitting: Endocrinology

## 2020-11-03 NOTE — Telephone Encounter (Signed)
Patient called asking to speak to nurse regarding her lab results. She said she had some additional questions from her convo with Mardene Celeste. Ph# [phone]

## 2020-11-05 DIAGNOSIS — M7062 Trochanteric bursitis, left hip: Secondary | ICD-10-CM | POA: Diagnosis not present

## 2020-11-05 DIAGNOSIS — M25562 Pain in left knee: Secondary | ICD-10-CM | POA: Diagnosis not present

## 2020-11-10 NOTE — Telephone Encounter (Addendum)
Pt ready for scheduling on or after 11/10/20  Out-of-pocket cost due at time of visit: $280  Primary: Medicare Prolia co-insurance: 20% ($255) Admin fee co-insurance: 20% ($25)  Secondary: n/a Prolia co-insurance:  Admin fee co-insurance:   Deductible: $604 of $233 met  Prior Auth: not required  PA# Valid:

## 2020-11-10 NOTE — Telephone Encounter (Signed)
Pt called because she states she got a call last week but could barely hear what was being said. She states it had something to do with her levels but she wasn't quite sure. Pt requests a call to get those results of her levels at [phone].

## 2020-11-11 NOTE — Telephone Encounter (Signed)
LVM for pt to cb to the office regarding lab results/questions

## 2020-11-15 NOTE — Telephone Encounter (Signed)
LVM for pt to return call to the office.

## 2020-11-15 NOTE — Telephone Encounter (Signed)
Patient returned call regarding lab results/questions  Call back # [phone]

## 2020-11-18 DIAGNOSIS — M25562 Pain in left knee: Secondary | ICD-10-CM | POA: Diagnosis not present

## 2020-11-18 DIAGNOSIS — M7062 Trochanteric bursitis, left hip: Secondary | ICD-10-CM | POA: Diagnosis not present

## 2020-11-19 NOTE — Telephone Encounter (Signed)
LMTCB to schedule for Prolia injection Out-of-pocket cost due at time of visit: $280

## 2020-11-25 DIAGNOSIS — M25562 Pain in left knee: Secondary | ICD-10-CM | POA: Diagnosis not present

## 2020-11-25 DIAGNOSIS — M7062 Trochanteric bursitis, left hip: Secondary | ICD-10-CM | POA: Diagnosis not present

## 2020-11-29 ENCOUNTER — Telehealth: Payer: Self-pay | Admitting: Endocrinology

## 2020-11-29 NOTE — Telephone Encounter (Signed)
Pt called regarding her last AVS, she states the Dr told her to reduce her vitamin D3 from 4,000 to 3,000 and pt is confused as to why because she says if she has a D3 deficiency wouldn't we try to keep the dosage? Pt requests a call for clarification on this as this does not make sense to her at [phone]. Pt states you can leave a detailed message answering this if she does not pick up.

## 2020-11-30 NOTE — Telephone Encounter (Signed)
LVM for pt regarding her questions/concers with why she should reduce the vit d

## 2020-11-30 NOTE — Telephone Encounter (Signed)
Spoke with pt to help her understand the reason why she needed to reduce the vit D per Loanne Drilling

## 2020-12-03 ENCOUNTER — Telehealth: Payer: Self-pay

## 2020-12-03 DIAGNOSIS — M810 Age-related osteoporosis without current pathological fracture: Secondary | ICD-10-CM

## 2020-12-03 NOTE — Telephone Encounter (Signed)
New referral placed.   If she prefers to take Vitamin D 4000 units she should take this 5 days per week.

## 2020-12-03 NOTE — Telephone Encounter (Signed)
Pt called in and would like a second opinion/suggestion to the recommendation that she got from Dr. Loanne Drilling in regards to her Vitamin D. Dr. Loanne Drilling wants pt to reduce her Vitamin d from 4000 to 3000 due to her last level checked a month ago. Pt states that she doesn't usually take her vitamin d every day and would like to stay on the 4000, but would like to know how often she should take it.  Pt would also like a new endocrinologist referral, as she doesn't feel like her and Dr. Loanne Drilling are a good fit.

## 2020-12-03 NOTE — Telephone Encounter (Signed)
Relayed Dr. Verda Cumins message to pt.

## 2020-12-18 NOTE — Telephone Encounter (Signed)
Please follow up with pt to see if pt wishes to proceed with Prolia therapy.   Thanks! 

## 2020-12-21 NOTE — Telephone Encounter (Signed)
Called an scheduled pt for 12/23/20

## 2020-12-21 NOTE — Telephone Encounter (Signed)
LVM for pt to see if she is ready to start her Prolia injections. Waiting on response

## 2020-12-21 NOTE — Telephone Encounter (Signed)
Pt called she wanted to discuss the second opinion again

## 2020-12-23 ENCOUNTER — Ambulatory Visit (INDEPENDENT_AMBULATORY_CARE_PROVIDER_SITE_OTHER): Payer: Medicare Other | Admitting: Family Medicine

## 2020-12-23 ENCOUNTER — Other Ambulatory Visit: Payer: Self-pay

## 2020-12-23 VITALS — BP 90/60 | HR 93 | Temp 97.7°F | Wt 142.5 lb

## 2020-12-23 DIAGNOSIS — M810 Age-related osteoporosis without current pathological fracture: Secondary | ICD-10-CM

## 2020-12-23 NOTE — Progress Notes (Signed)
   Subjective:     Sherry Lamb is a 86 y.o. female presenting for Advice Only (2nd opinion for Endo and alternate options to prolia)     HPI  #osteoporosis - was on fosamax for 10 years - was told this was too long - taking vitamin D  - calcium - tablets were not tolerated - does eat yogurt, cheese milk   Review of Systems   Social History   Tobacco Use  Smoking Status Never  Smokeless Tobacco Never        Objective:    BP Readings from Last 3 Encounters:  12/23/20 90/60  10/25/20 118/60  07/07/20 118/78   Wt Readings from Last 3 Encounters:  12/23/20 142 lb 8 oz (64.6 kg)  10/25/20 142 lb (64.4 kg)  07/07/20 147 lb (66.7 kg)    BP 90/60   Pulse 93   Temp 97.7 F (36.5 C) (Temporal)   Wt 142 lb 8 oz (64.6 kg)   SpO2 98%   BMI 24.46 kg/m    Physical Exam Constitutional:      General: She is not in acute distress.    Appearance: She is well-developed. She is not diaphoretic.  HENT:     Right Ear: External ear normal.     Left Ear: External ear normal.  Eyes:     Conjunctiva/sclera: Conjunctivae normal.  Cardiovascular:     Rate and Rhythm: Normal rate.  Pulmonary:     Effort: Pulmonary effort is normal.  Musculoskeletal:     Cervical back: Neck supple.  Skin:    General: Skin is warm and dry.     Capillary Refill: Capillary refill takes less than 2 seconds.  Neurological:     Mental Status: She is alert. Mental status is at baseline.  Psychiatric:        Mood and Affect: Mood normal.        Behavior: Behavior normal.          Assessment & Plan:   Problem List Items Addressed This Visit       Musculoskeletal and Integument   Osteoporosis - Primary    Reviewed last 3 dexa scans with patient. 2022 and 2019 only her arm was done which has worsened. Spine was normal in 2017. S/p hip replacement. Discussed that often prolia is the next step but is challenging due to rebound impact and longterm treatment. She is interested in hearing  about other options - discussed SERMs but also informed that I was not sure what would make her a candidate and that endocrinology would be helpful with that. Also discussed assessing calcium level in diet and reaching 1200 mg with diet + tums as she has not been able to tolerate other supplements in past. Discussed adding upper-body weight training and that the risk of hip fracture vs arm fracture are different and to take that into consideration if considering to not pursue additional treatment. Appreciate endocrine support moving forward.       I spent >25 minutes with pt, obtaining history, examining, reviewing chart, documenting encounter and discussing the above plan of care.   Return if symptoms worsen or fail to improve.  Lesleigh Noe, MD  This visit occurred during the SARS-CoV-2 public health emergency.  Safety protocols were in place, including screening questions prior to the visit, additional usage of staff PPE, and extensive cleaning of exam room while observing appropriate contact time as indicated for disinfecting solutions.

## 2020-12-23 NOTE — Assessment & Plan Note (Addendum)
Reviewed last 3 dexa scans with patient. 2022 and 2019 only her arm was done which has worsened. Spine was normal in 2017. S/p hip replacement. Discussed that often prolia is the next step but is challenging due to rebound impact and longterm treatment. She is interested in hearing about other options - discussed SERMs but also informed that I was not sure what would make her a candidate and that endocrinology would be helpful with that. Also discussed assessing calcium level in diet and reaching 1200 mg with diet + tums as she has not been able to tolerate other supplements in past. Discussed adding upper-body weight training and that the risk of hip fracture vs arm fracture are different and to take that into consideration if considering to not pursue additional treatment. Appreciate endocrine support moving forward.

## 2020-12-23 NOTE — Patient Instructions (Addendum)
[facility] [place] [address] Phone: [phone] Hours: 8:00 - 4:00 M-F     Would recommend the following- Bone health:   1) 800 units of Vitamin D daily 2) Get 1200 mg of elemental calcium --- this is best from your diet. Try to track how much calcium you get on a typical day. You could find ways to add more (dairy products, leafy greens). Take a supplement for whatever you don't typically get so you reach 1200 mg of calcium.  3) Physical activity (ideally weight bearing) - like walking briskly 30 minutes 5 days a week.  --- Weight lifting for upper body 5 days a week as well

## 2020-12-23 NOTE — Telephone Encounter (Signed)
Referral was faxed to [facility] Endo - Dr Gabriel Carina  Referral and OV notes faxed via ROI to Dr Jacklynn Ganong [facility] Faxed to [phone]    [email or web address]  Virl Cagey, CMA on 12/23/2020 1449 - delivered at 12/23/2020 1449  A. Lavone Orn, MD Endocrinologist in [place], [place] Address: [address] [place], [place], [place] [address] Phone: [phone]

## 2020-12-31 DIAGNOSIS — M25552 Pain in left hip: Secondary | ICD-10-CM | POA: Diagnosis not present

## 2020-12-31 DIAGNOSIS — M1711 Unilateral primary osteoarthritis, right knee: Secondary | ICD-10-CM | POA: Diagnosis not present

## 2021-01-04 ENCOUNTER — Ambulatory Visit: Payer: Medicare Other

## 2021-01-11 ENCOUNTER — Ambulatory Visit: Payer: Medicare Other | Admitting: Family Medicine

## 2021-01-19 ENCOUNTER — Other Ambulatory Visit: Payer: Self-pay

## 2021-01-19 ENCOUNTER — Encounter: Payer: Self-pay | Admitting: Family Medicine

## 2021-01-19 ENCOUNTER — Ambulatory Visit (INDEPENDENT_AMBULATORY_CARE_PROVIDER_SITE_OTHER): Payer: Medicare Other | Admitting: Family Medicine

## 2021-01-19 VITALS — BP 124/78 | HR 74 | Temp 97.0°F | Ht 63.5 in | Wt 140.2 lb

## 2021-01-19 DIAGNOSIS — E785 Hyperlipidemia, unspecified: Secondary | ICD-10-CM

## 2021-01-19 DIAGNOSIS — Z0000 Encounter for general adult medical examination without abnormal findings: Secondary | ICD-10-CM

## 2021-01-19 LAB — COMPREHENSIVE METABOLIC PANEL
ALT: 14 U/L (ref 0–35)
AST: 22 U/L (ref 0–37)
Albumin: 4.1 g/dL (ref 3.5–5.2)
Alkaline Phosphatase: 56 U/L (ref 39–117)
BUN: 29 mg/dL — ABNORMAL HIGH (ref 6–23)
CO2: 29 mEq/L (ref 19–32)
Calcium: 9.9 mg/dL (ref 8.4–10.5)
Chloride: 101 mEq/L (ref 96–112)
Creatinine, Ser: 0.56 mg/dL (ref 0.40–1.20)
GFR: 83.87 mL/min (ref >60.00)
Glucose, Bld: 90 mg/dL (ref 70–99)
Potassium: 4 mEq/L (ref 3.5–5.1)
Sodium: 140 mEq/L (ref 135–145)
Total Bilirubin: 0.9 mg/dL (ref 0.2–1.2)
Total Protein: 7.1 g/dL (ref 6.0–8.3)

## 2021-01-19 LAB — LIPID PANEL
Cholesterol: 184 mg/dL (ref 0–200)
HDL: 92 mg/dL (ref >39.00)
LDL Cholesterol: 78 mg/dL (ref 0–99)
NonHDL: 91.68
Total CHOL/HDL Ratio: 2
Triglycerides: 70 mg/dL (ref 0.0–149.0)
VLDL: 14 mg/dL (ref 0.0–40.0)

## 2021-01-19 NOTE — Progress Notes (Signed)
Subjective:   Sherry Lamb is a 86 y.o. female who presents for Medicare Annual (Subsequent) preventive examination.  Review of Systems    Review of Systems  Constitutional:  Negative for chills and fever.  HENT:  Negative for congestion and sore throat.   Eyes:  Negative for blurred vision and double vision.  Respiratory:  Negative for shortness of breath.   Cardiovascular:  Negative for chest pain.  Gastrointestinal:  Negative for heartburn, nausea and vomiting.  Genitourinary: Negative.   Musculoskeletal: Negative.  Negative for myalgias.  Skin:  Negative for rash.  Neurological:  Negative for dizziness and headaches.  Endo/Heme/Allergies:  Does not bruise/bleed easily.  Psychiatric/Behavioral:  Positive for depression. The patient is nervous/anxious.    Cardiac Risk Factors include: advanced age (>62mn, >>63women);dyslipidemia     Objective:    Today's Vitals   01/19/21 1130  BP: 124/78  Pulse: 74  Temp: (!) 97 F (36.1 C)  TempSrc: Temporal  SpO2: 96%  Weight: 140 lb 4 oz (63.6 kg)  Height: 5' 3.5" (1.613 m)   Body mass index is 24.45 kg/m.  Advanced Directives 01/19/2021 02/23/2020 02/13/2020 10/20/2019 09/18/2018  Does Patient Have a Medical Advance Directive? Yes Yes Yes Yes Yes  Type of AParamedicof APeoriaLiving will H[place]land ParkLiving will HHopeLiving will HSt. MarysLiving will  Does patient want to make changes to medical advance directive? No - Patient declined No - Guardian declined - - -  Copy of HLake Wyliein Chart? Yes - validated most recent copy scanned in chart (See row information) Yes - validated most recent copy scanned in chart (See row information) No - copy requested No - copy requested No - copy requested    Current Medications (verified) Outpatient Encounter Medications as of 01/19/2021  Medication Sig    aspirin 81 MG EC tablet Take 81 mg by mouth daily. Swallow whole.   Cholecalciferol (VITAMIN D) 50 MCG (2000 UT) CAPS Take 2,000 Units by mouth every other day.   Polyvinyl Alcohol-Povidone (REFRESH OP) Place 1 drop into both eyes 2 (two) times daily.   rosuvastatin (CRESTOR) 5 MG tablet Take 1 tablet (5 mg total) by mouth daily.   [DISCONTINUED] trolamine salicylate (ASPERCREME) 10 % cream Apply 1 application topically as needed for muscle pain.   No facility-administered encounter medications on file as of 01/19/2021.    Allergies (verified) Penicillins and Prednisone   History: Past Medical History:  Diagnosis Date   Arthritis    Frequent headaches    GERD (gastroesophageal reflux disease)    Hyperlipidemia    Pneumonia    PONV (postoperative nausea and vomiting)    Past Surgical History:  Procedure Laterality Date   ABDOMINAL HYSTERECTOMY     ANKLE SURGERY     EYE SURGERY     Left hip replacement Left 2013   right hip replacement Right 2000   TONSILLECTOMY AND ADENOIDECTOMY  1943   TOTAL KNEE ARTHROPLASTY Right 02/23/2020   Procedure: TOTAL KNEE ARTHROPLASTY;  Surgeon: AGaynelle Arabian MD;  Location: [facility];  Service: Orthopedics;  Laterality: Right;  574m   WISDOM TOOTH EXTRACTION     Family History  Problem Relation Age of Onset   Arthritis Mother    Asthma Mother    Hearing loss Mother    Osteoporosis Mother    Heart disease Father    Hyperlipidemia Father    Kidney disease Father  Depression Brother    Hyperlipidemia Brother    Arrhythmia Brother        Psychologist, forensic   Breast cancer Neg Hx    Social History   Socioeconomic History   Marital status: Married    Spouse name: [name]   Number of children: 1   Years of education: secretarial school   Highest education level: Not on file  Occupational History   Not on file  Tobacco Use   Smoking status: Never   Smokeless tobacco: Never  Vaping Use   Vaping Use: Never used  Substance and Sexual Activity    Alcohol use: Yes    Alcohol/week: 1.0 standard drink    Types: 1 Glasses of wine per week    Comment: 1 wine day    Drug use: Never   Sexual activity: Yes    Partners: Male    Birth control/protection: Post-menopausal, Surgical    Comment: Hysterectomy  Other Topics Concern   Not on file  Social History Narrative   07/07/20   From: [place], [place] to be near family   Living: with husband [name] (1966)   Work: retired from music business - record and Writer      Family: daughter Anderson [place] - no children      Enjoys: play tennis (though cannot play), reading, music      Exercise: walking, tries to do her knee therapy   Diet: generally      Safety   Seat belts: Yes    Guns: No   Safe in relationships: Yes    Social Determinants of Radio broadcast assistant Strain: Not on file  Food Insecurity: Not on file  Transportation Needs: Not on file  Physical Activity: Not on file  Stress: Not on file  Social Connections: Not on file    Tobacco Counseling Counseling given: Not Answered   Clinical Intake:  Pre-visit preparation completed: No  Pain : No/denies pain     BMI - recorded: 24.45 Nutritional Status: BMI of 19-24  Normal Nutritional Risks: None Diabetes: No  How often do you need to have someone help you when you read instructions, pamphlets, or other written materials from your doctor or pharmacy?: 1 - Never What is the last grade level you completed in school?: 1 year of college  Diabetic?no  Interpreter Needed?: No      Activities of Daily Living In your present state of health, do you have any difficulty performing the following activities: 01/19/2021 02/23/2020  Hearing? N -  Vision? N -  Comment floaters which are stable -  Difficulty concentrating or making decisions? N -  Walking or climbing stairs? Y -  Comment knees -  Dressing or bathing? N -  Doing errands, shopping? N N  Preparing Food and eating ? N -  Using the  Toilet? N -  In the past six months, have you accidently leaked urine? Y -  Comment sneezing -  Do you have problems with loss of bowel control? N -  Managing your Medications? N -  Managing your Finances? N -  Housekeeping or managing your Housekeeping? N -  Some recent data might be hidden    Patient Care Team: Lesleigh Noe, MD as PCP - General (Family Medicine) Derl Barrow, [place] as Physician Assistant (Orthopedic Surgery) Carmon Ginsberg (Orthopedic Surgery)  Indicate any recent Medical Services you may have received from other than [facility] providers in the past year (date may  be approximate).     Assessment:   This is a routine wellness examination for [facility].  Hearing/Vision screen Hearing Screening   '250Hz'$  '500Hz'$  '1000Hz'$  '2000Hz'$  '4000Hz'$   Right ear '20 20 20 20 '$ 0  Left ear '20 20 20 20 '$ 0  Vision Screening - Comments:: Last eye exam at [facility] October 2021  Dietary issues and exercise activities discussed: Current Exercise Habits: Home exercise routine, Type of exercise: walking;Other - see comments (therapy), Time (Minutes): 30, Frequency (Times/Week): 6, Weekly Exercise (Minutes/Week): 180, Intensity: Mild, Exercise limited by: orthopedic condition(s)   Goals Addressed             This Visit's Progress    Patient Stated       Decrease unnecessary medications      Depression Screen PHQ 2/9 Scores 01/19/2021 04/21/2020 10/20/2019 09/18/2018 09/10/2017  PHQ - 2 Score - 0 2 2 0  PHQ- 9 Score - - 2 2 -  Exception Documentation Patient refusal - - - -    Fall Risk Fall Risk  01/19/2021 04/21/2020 10/20/2019 09/18/2018 09/10/2017  Falls in the past year? 0 0 0 0 No  Number falls in past yr: 0 0 0 - -  Injury with Fall? - 0 0 - -  Risk for fall due to : - - No Fall Risks - -  Follow up - Falls evaluation completed Falls evaluation completed;Falls prevention discussed - -    FALL RISK PREVENTION PERTAINING TO THE HOME:  Any stairs in or around the home?  Yes  If so, are there any without handrails? Yes  Home free of loose throw rugs in walkways, pet beds, electrical cords, etc? Yes  Adequate lighting in your home to reduce risk of falls? Yes   ASSISTIVE DEVICES UTILIZED TO PREVENT FALLS:  Life alert? No  Use of a cane, walker or w/c? Yes  Grab bars in the bathroom? Yes  Shower chair or bench in shower? Yes  Elevated toilet seat or a handicapped toilet? Yes     Cognitive Function: MMSE - Mini Mental State Exam 10/20/2019 09/18/2018  Orientation to time 5 5  Orientation to Place 5 5  Registration 3 3  Attention/ Calculation 5 0  Recall 3 3  Language- name 2 objects - 0  Language- repeat 1 1  Language- follow 3 step command - 0  Language- read & follow direction - 0  Write a sentence - 0  Copy design - 0  Total score - 17         Mini-Cog - 01/19/21 1149     Normal clock drawing test? yes    How many words correct? 3              Immunizations Immunization History  Administered Date(s) Administered   Fluad Quad(high Dose 65+) 02/11/2019   Influenza, High Dose Seasonal PF 03/13/2018   PFIZER(Purple Top)SARS-COV-2 Vaccination 05/26/2019, 06/16/2019, 02/10/2020   Pneumococcal Conjugate-13 10/27/2019   Zoster Recombinat (Shingrix) 07/02/2018, 11/19/2018    TDAP status: Due, Education has been provided regarding the importance of this vaccine. Advised may receive this vaccine at local pharmacy or Health Dept. Aware to provide a copy of the vaccination record if obtained from local pharmacy or Health Dept. Verbalized acceptance and understanding.  Flu Vaccine status: Due, Education has been provided regarding the importance of this vaccine. Advised may receive this vaccine at local pharmacy or Health Dept. Aware to provide a copy of the vaccination record if obtained from local pharmacy  or Health Dept. Verbalized acceptance and understanding.  Pneumococcal vaccine status: Due, Education has been provided regarding the  importance of this vaccine. Advised may receive this vaccine at local pharmacy or Health Dept. Aware to provide a copy of the vaccination record if obtained from local pharmacy or Health Dept. Verbalized acceptance and understanding.  Covid-19 vaccine status: Completed vaccines  Qualifies for Shingles Vaccine? Yes   Zostavax completed Yes   Shingrix Completed?: Yes  Screening Tests Health Maintenance  Topic Date Due   TETANUS/TDAP  Never done   COVID-19 Vaccine (4 - Booster for Pfizer series) 05/04/2020   PNA vac Low Risk Adult (2 of 2 - PPSV23) 10/26/2020   INFLUENZA VACCINE  12/06/2020   DEXA SCAN  Completed   Zoster Vaccines- Shingrix  Completed   HPV VACCINES  Aged Out    Health Maintenance  Health Maintenance Due  Topic Date Due   TETANUS/TDAP  Never done   COVID-19 Vaccine (4 - Booster for Pfizer series) 05/04/2020   PNA vac Low Risk Adult (2 of 2 - PPSV23) 10/26/2020   INFLUENZA VACCINE  12/06/2020    Colorectal cancer screening: No longer required.   Mammogram status: Completed 2020. Repeat every year  Bone Density status: Completed 07/2020. Results reflect: Bone density results: OSTEOPOROSIS. Repeat every 2 years.  Lung Cancer Screening: (Low Dose CT Chest recommended if Age 24-80 years, 30 pack-year currently smoking OR have quit w/in 15years.) does not qualify.   Lung Cancer Screening Referral: n/a  Additional Screening:  Hepatitis C Screening: does qualify; Completed unclear  Vision Screening: Recommended annual ophthalmology exams for early detection of glaucoma and other disorders of the eye. Is the patient up to date with their annual eye exam?  Yes  Who is the provider or what is the name of the office in which the patient attends annual eye exams? [facility] If pt is not established with a provider, would they like to be referred to a provider to establish care? No .   Dental Screening: Recommended annual dental exams for proper oral  hygiene  Community Resource Referral / Chronic Care Management: CRR required this visit?  No   CCM required this visit?  No      Plan:     I have personally reviewed and noted the following in the patient's chart:   Medical and social history Use of alcohol, tobacco or illicit drugs  Current medications and supplements including opioid prescriptions.  Functional ability and status Nutritional status Physical activity Advanced directives List of other physicians Hospitalizations, surgeries, and ER visits in previous 12 months Vitals Screenings to include cognitive, depression, and falls Referrals and appointments  In addition, I have reviewed and discussed with patient certain preventive protocols, quality metrics, and best practice recommendations. A written personalized care plan for preventive services as well as general preventive health recommendations were provided to patient.     Lesleigh Noe, MD   01/19/2021

## 2021-01-19 NOTE — Patient Instructions (Addendum)
Vaccines - Tdap - at the pharmacy - get the flu shot - PPSV-23 (due for this per our records) - can get this in office   Call for mammogram

## 2021-01-24 DIAGNOSIS — Z23 Encounter for immunization: Secondary | ICD-10-CM | POA: Diagnosis not present

## 2021-01-26 NOTE — Telephone Encounter (Signed)
Hi Dr. Loanne Drilling,  I just wanted to send a FYI that pt has not scheduled Prolia injection.   Theadora Rama, CMA

## 2021-01-28 DIAGNOSIS — M25562 Pain in left knee: Secondary | ICD-10-CM | POA: Diagnosis not present

## 2021-02-02 ENCOUNTER — Ambulatory Visit: Payer: Medicare Other | Admitting: Podiatry

## 2021-02-09 ENCOUNTER — Other Ambulatory Visit: Payer: Self-pay

## 2021-02-09 ENCOUNTER — Ambulatory Visit (INDEPENDENT_AMBULATORY_CARE_PROVIDER_SITE_OTHER): Payer: Medicare Other | Admitting: Podiatry

## 2021-02-09 ENCOUNTER — Encounter: Payer: Self-pay | Admitting: Podiatry

## 2021-02-09 DIAGNOSIS — M79675 Pain in left toe(s): Secondary | ICD-10-CM

## 2021-02-09 DIAGNOSIS — B351 Tinea unguium: Secondary | ICD-10-CM

## 2021-02-09 DIAGNOSIS — M79674 Pain in right toe(s): Secondary | ICD-10-CM

## 2021-02-14 NOTE — Progress Notes (Signed)
  Subjective:  Patient ID: Sherry Lamb, female    DOB: [birth year],  MRN: [id number]  Chief Complaint  Patient presents with   Nail Problem    Nail trim RFC   She c/o painful, sensitive toenails.  No other complaints    86 y.o. female presents with the above complaint. History confirmed with patient.  Her nails are thickened and elongated and painful.    Objective:  Physical Exam: warm, good capillary refill, no trophic changes or ulcerative lesions, normal DP and PT pulses and normal sensory exam.  She has thickened and elongated yellow toenails x10 with subungual debris Assessment:   1. Pain due to onychomycosis of toenails of both feet      Plan:  Patient was evaluated and treated and all questions answered.   Discussed the etiology and treatment options for the condition in detail with the patient. Educated patient on the topical and oral treatment options for mycotic nails. Recommended debridement of the nails today. Sharp and mechanical debridement performed of all painful and mycotic nails today. Nails debrided in length and thickness using a nail nipper to level of comfort. Discussed treatment options including appropriate shoe gear. Follow up as needed for painful nails.    Return in 9 weeks (on 04/13/2021) for painful thickened nails .

## 2021-02-15 ENCOUNTER — Telehealth: Payer: Self-pay | Admitting: Family Medicine

## 2021-02-15 NOTE — Telephone Encounter (Signed)
Mrs. Vanalstine called and wanted to know about talking to [facility]

## 2021-02-15 NOTE — Telephone Encounter (Signed)
Spoke to pt and she inquired about her last lab results. I gave pt information but also stated that I would mail her a copy of last labs. Pt appreciative.

## 2021-03-01 NOTE — Telephone Encounter (Signed)
Pt archived in [email or web address].  Please advise if patient and/or provider wish to proceed with Prolia therpay.  

## 2021-03-21 ENCOUNTER — Other Ambulatory Visit: Payer: Self-pay | Admitting: Family Medicine

## 2021-03-28 DIAGNOSIS — M7062 Trochanteric bursitis, left hip: Secondary | ICD-10-CM | POA: Diagnosis not present

## 2021-03-28 DIAGNOSIS — M1712 Unilateral primary osteoarthritis, left knee: Secondary | ICD-10-CM | POA: Diagnosis not present

## 2021-03-28 DIAGNOSIS — Z96651 Presence of right artificial knee joint: Secondary | ICD-10-CM | POA: Diagnosis not present

## 2021-04-04 NOTE — H&P (View-Only) (Signed)
COVID swab appointment: 04/14/21  COVID Vaccine Completed: yes x3 Date COVID Vaccine completed: 05/26/19, 06/16/19 Has received booster: 02/10/20 COVID vaccine manufacturer: Pfizer      Date of COVID positive in last 90 days: no  PCP - Waunita Schooner, MD Cardiologist - n/a  Chest x-ray - n/a EKG - n/a Stress Test - years ago per pt ECHO - n/a Cardiac Cath - n/a Pacemaker/ICD device last checked: n/a Spinal Cord Stimulator: n/a  Sleep Study - n/a CPAP -   Fasting Blood Sugar - n/a Checks Blood Sugar _____ times a day  Blood Thinner Instructions:  Aspirin Instructions: ASA 81, stop 5 days Last Dose:  Activity level: Can go up a flight of stairs and perform activities of daily living without stopping and without symptoms of chest pain or shortness of breath.  Anesthesia review:   Patient denies shortness of breath, fever, cough and chest pain at PAT appointment   Patient verbalized understanding of instructions that were given to them at the PAT appointment. Patient was also instructed that they will need to review over the PAT instructions again at home before surgery.

## 2021-04-04 NOTE — Progress Notes (Addendum)
COVID swab appointment: 04/14/21  COVID Vaccine Completed: yes x3 Date COVID Vaccine completed: 05/26/19, 06/16/19 Has received booster: 02/10/20 COVID vaccine manufacturer: Pfizer      Date of COVID positive in last 90 days: no  PCP - Waunita Schooner, MD Cardiologist - n/a  Chest x-ray - n/a EKG - n/a Stress Test - years ago per pt ECHO - n/a Cardiac Cath - n/a Pacemaker/ICD device last checked: n/a Spinal Cord Stimulator: n/a  Sleep Study - n/a CPAP -   Fasting Blood Sugar - n/a Checks Blood Sugar _____ times a day  Blood Thinner Instructions:  Aspirin Instructions: ASA 81, stop 5 days Last Dose:  Activity level: Can go up a flight of stairs and perform activities of daily living without stopping and without symptoms of chest pain or shortness of breath.  Anesthesia review:   Patient denies shortness of breath, fever, cough and chest pain at PAT appointment   Patient verbalized understanding of instructions that were given to them at the PAT appointment. Patient was also instructed that they will need to review over the PAT instructions again at home before surgery.

## 2021-04-04 NOTE — Patient Instructions (Addendum)
DUE TO COVID-19 ONLY ONE VISITOR IS ALLOWED TO COME WITH YOU AND STAY IN THE WAITING ROOM ONLY DURING PRE OP AND PROCEDURE.   **NO VISITORS ARE ALLOWED IN THE SHORT STAY AREA OR RECOVERY ROOM!!**  IF YOU WILL BE ADMITTED INTO THE HOSPITAL YOU ARE ALLOWED ONLY TWO SUPPORT PEOPLE DURING VISITATION HOURS ONLY (10AM -8PM)   The support person(s) may change daily. The support person(s) must pass our screening, gel in and out, and wear a mask at all times, including in the patient's room. Patients must also wear a mask when staff or their support person are in the room.  No visitors under the age of 53. Any visitor under the age of 28 must be accompanied by an adult.    COVID SWAB TESTING MUST BE COMPLETED ON:  04/14/21 **MUST PRESENT COMPLETED FORM AT TESTING SITE**    [facility] [place] (backside of the building) Open 8am-3pm. No appointment needed. You are not required to quarantine, however you are required to wear a well-fitted mask when you are out and around people not in your household.  Hand Hygiene often Do NOT share personal items Notify your provider if you are in close contact with someone who has COVID or you develop fever 100.4 or greater, new onset of sneezing, cough, sore throat, shortness of breath or body aches.       Your procedure is scheduled on: 04/18/21   Report to [facility] Main Entrance    Report to admitting at 8:50 AM   Call this number if you have problems the morning of surgery [phone]   Do not eat food :After Midnight.   May have liquids until 8:35 AM day of surgery  CLEAR LIQUID DIET  Foods Allowed                                                                     Foods Excluded  Water, Black Coffee and tea (no milk or creamer)           liquids that you cannot  Plain Jell-O in any flavor  (No red)                                    see through such as: Fruit ices (not with fruit pulp)                                             milk, soups, orange juice              Iced Popsicles (No red)                                               All solid food  Apple juices Sports drinks like Gatorade (No red) Lightly seasoned clear broth or consume(fat free) Sugar     The day of surgery:  Drink ONE (1) Pre-Surgery Clear Ensure by 8:35 am the morning of surgery. Drink in one sitting. Do not sip.  This drink was given to you during your hospital  pre-op appointment visit. Nothing else to drink after completing the  Pre-Surgery Clear Ensure.          If you have questions, please contact your surgeon's office.     Oral Hygiene is also important to reduce your risk of infection.                                    Remember - BRUSH YOUR TEETH THE MORNING OF SURGERY WITH YOUR REGULAR TOOTHPASTE   Take these medicines the morning of surgery with A SIP OF WATER: Crestor                              You may not have any metal on your body including hair pins, jewelry, and body piercing             Do not wear make-up, lotions, powders, perfumes, or deodorant  Do not wear nail polish including gel and S&S, artificial/acrylic nails, or any other type of covering on natural nails including finger and toenails. If you have artificial nails, gel coating, etc. that needs to be removed by a nail salon please have this removed prior to surgery or surgery may need to be canceled/ delayed if the surgeon/ anesthesia feels like they are unable to be safely monitored.   Do not shave  48 hours prior to surgery.    Do not bring valuables to the hospital. [place].   Bring small overnight bag day of surgery.              Please read over the following fact sheets you were given: IF YOU HAVE QUESTIONS ABOUT YOUR PRE-OP INSTRUCTIONS PLEASE CALL [facility] - Preparing for Surgery Before surgery, you can play an important role.   Because skin is not sterile, your skin needs to be as free of germs as possible.  You can reduce the number of germs on your skin by washing with CHG (chlorahexidine gluconate) soap before surgery.  CHG is an antiseptic cleaner which kills germs and bonds with the skin to continue killing germs even after washing. Please DO NOT use if you have an allergy to CHG or antibacterial soaps.  If your skin becomes reddened/irritated stop using the CHG and inform your nurse when you arrive at Short Stay. Do not shave (including legs and underarms) for at least 48 hours prior to the first CHG shower.  You may shave your face/neck.  Please follow these instructions carefully:  1.  Shower with CHG Soap the night before surgery and the  morning of surgery.  2.  If you choose to wash your hair, wash your hair first as usual with your normal  shampoo.  3.  After you shampoo, rinse your hair and body thoroughly to remove the shampoo.  4.  Use CHG as you would any other liquid soap.  You can apply chg directly to the skin and wash.  Gently with a scrungie or clean washcloth.  5.  Apply the CHG Soap to your body ONLY FROM THE NECK DOWN.   Do   not use on face/ open                           Wound or open sores. Avoid contact with eyes, ears mouth and   genitals (private parts).                       Wash face,  Genitals (private parts) with your normal soap.             6.  Wash thoroughly, paying special attention to the area where your    surgery  will be performed.  7.  Thoroughly rinse your body with warm water from the neck down.  8.  DO NOT shower/wash with your normal soap after using and rinsing off the CHG Soap.                9.  Pat yourself dry with a clean towel.            10.  Wear clean pajamas.            11.  Place clean sheets on your bed the night of your first shower and do not  sleep with pets. Day of Surgery : Do not apply any lotions/deodorants the morning of  surgery.  Please wear clean clothes to the hospital/surgery center.  FAILURE TO FOLLOW THESE INSTRUCTIONS MAY RESULT IN THE CANCELLATION OF YOUR SURGERY  PATIENT SIGNATURE_________________________________  NURSE SIGNATURE__________________________________  ________________________________________________________________________   Sherry Lamb  An incentive spirometer is a tool that can help keep your lungs clear and active. This tool measures how well you are filling your lungs with each breath. Taking long deep breaths may help reverse or decrease the chance of developing breathing (pulmonary) problems (especially infection) following: A long period of time when you are unable to move or be active. BEFORE THE PROCEDURE  If the spirometer includes an indicator to show your best effort, your nurse or respiratory therapist will set it to a desired goal. If possible, sit up straight or lean slightly forward. Try not to slouch. Hold the incentive spirometer in an upright position. INSTRUCTIONS FOR USE  Sit on the edge of your bed if possible, or sit up as far as you can in bed or on a chair. Hold the incentive spirometer in an upright position. Breathe out normally. Place the mouthpiece in your mouth and seal your lips tightly around it. Breathe in slowly and as deeply as possible, raising the piston or the ball toward the top of the column. Hold your breath for 3-5 seconds or for as long as possible. Allow the piston or ball to fall to the bottom of the column. Remove the mouthpiece from your mouth and breathe out normally. Rest for a few seconds and repeat Steps 1 through 7 at least 10 times every 1-2 hours when you are awake. Take your time and take a few normal breaths between deep breaths. The spirometer may include an indicator to show your best effort. Use the indicator as a goal to work toward during each repetition. After each set of 10 deep breaths, practice coughing to be  sure  your lungs are clear. If you have an incision (the cut made at the time of surgery), support your incision when coughing by placing a pillow or rolled up towels firmly against it. Once you are able to get out of bed, walk around indoors and cough well. You may stop using the incentive spirometer when instructed by your caregiver.  RISKS AND COMPLICATIONS Take your time so you do not get dizzy or light-headed. If you are in pain, you may need to take or ask for pain medication before doing incentive spirometry. It is harder to take a deep breath if you are having pain. AFTER USE Rest and breathe slowly and easily. It can be helpful to keep track of a log of your progress. Your caregiver can provide you with a simple table to help with this. If you are using the spirometer at home, follow these instructions: [place] IF:  You are having difficultly using the spirometer. You have trouble using the spirometer as often as instructed. Your pain medication is not giving enough relief while using the spirometer. You develop fever of 100.5 F (38.1 C) or higher. SEEK IMMEDIATE MEDICAL CARE IF:  You cough up bloody sputum that had not been present before. You develop fever of 102 F (38.9 C) or greater. You develop worsening pain at or near the incision site. MAKE SURE YOU:  Understand these instructions. Will watch your condition. Will get help right away if you are not doing well or get worse. Document Released: 09/04/2006 Document Revised: 07/17/2011 Document Reviewed: 11/05/2006 [employer] Patient Information 2014 [employer], [place].   ________________________________________________________________________   WHAT IS A BLOOD TRANSFUSION? Blood Transfusion Information  A transfusion is the replacement of blood or some of its parts. Blood is made up of multiple cells which provide different functions. Red blood cells carry oxygen and are used for blood loss replacement. White blood  cells fight against infection. Platelets control bleeding. Plasma helps clot blood. Other blood products are available for specialized needs, such as hemophilia or other clotting disorders. BEFORE THE TRANSFUSION  Who gives blood for transfusions?  Healthy volunteers who are fully evaluated to make sure their blood is safe. This is blood bank blood. Transfusion therapy is the safest it has ever been in the practice of medicine. Before blood is taken from a donor, a complete history is taken to make sure that person has no history of diseases nor engages in risky social behavior (examples are intravenous drug use or sexual activity with multiple partners). The donor's travel history is screened to minimize risk of transmitting infections, such as malaria. The donated blood is tested for signs of infectious diseases, such as HIV and hepatitis. The blood is then tested to be sure it is compatible with you in order to minimize the chance of a transfusion reaction. If you or a relative donates blood, this is often done in anticipation of surgery and is not appropriate for emergency situations. It takes many days to process the donated blood. RISKS AND COMPLICATIONS Although transfusion therapy is very safe and saves many lives, the main dangers of transfusion include:  Getting an infectious disease. Developing a transfusion reaction. This is an allergic reaction to something in the blood you were given. Every precaution is taken to prevent this. The decision to have a blood transfusion has been considered carefully by your caregiver before blood is given. Blood is not given unless the benefits outweigh the risks. AFTER THE TRANSFUSION Right after receiving a blood  transfusion, you will usually feel much better and more energetic. This is especially true if your red blood cells have gotten low (anemic). The transfusion raises the level of the red blood cells which carry oxygen, and this usually causes an  energy increase. The nurse administering the transfusion will monitor you carefully for complications. HOME CARE INSTRUCTIONS  No special instructions are needed after a transfusion. You may find your energy is better. Speak with your caregiver about any limitations on activity for underlying diseases you may have. SEEK MEDICAL CARE IF:  Your condition is not improving after your transfusion. You develop redness or irritation at the intravenous (IV) site. SEEK IMMEDIATE MEDICAL CARE IF:  Any of the following symptoms occur over the next 12 hours: Shaking chills. You have a temperature by mouth above 102 F (38.9 C), not controlled by medicine. Chest, back, or muscle pain. People around you feel you are not acting correctly or are confused. Shortness of breath or difficulty breathing. Dizziness and fainting. You get a rash or develop hives. You have a decrease in urine output. Your urine turns a dark color or changes to pink, red, or brown. Any of the following symptoms occur over the next 10 days: You have a temperature by mouth above 102 F (38.9 C), not controlled by medicine. Shortness of breath. Weakness after normal activity. The white part of the eye turns yellow (jaundice). You have a decrease in the amount of urine or are urinating less often. Your urine turns a dark color or changes to pink, red, or brown. Document Released: 04/21/2000 Document Revised: 07/17/2011 Document Reviewed: 12/09/2007 [facility] Patient Information 2014 [place], [place].  _______________________________________________________________________

## 2021-04-05 ENCOUNTER — Other Ambulatory Visit: Payer: Self-pay

## 2021-04-05 ENCOUNTER — Encounter (HOSPITAL_COMMUNITY): Payer: Self-pay

## 2021-04-05 ENCOUNTER — Encounter (HOSPITAL_COMMUNITY)
Admission: RE | Admit: 2021-04-05 | Discharge: 2021-04-05 | Disposition: A | Discharge status: Home / Self Care | Payer: Medicare Other | Source: Ambulatory Visit | Attending: Orthopedic Surgery | Admitting: Orthopedic Surgery

## 2021-04-05 VITALS — BP 151/86 | HR 81 | Temp 97.6°F | Resp 18 | Ht 64.0 in | Wt 139.8 lb

## 2021-04-05 DIAGNOSIS — Z01818 Encounter for other preprocedural examination: Secondary | ICD-10-CM

## 2021-04-05 DIAGNOSIS — Z01812 Encounter for preprocedural laboratory examination: Secondary | ICD-10-CM | POA: Insufficient documentation

## 2021-04-05 LAB — TYPE AND SCREEN
ABO/RH(D): O POS
Antibody Screen: NEGATIVE

## 2021-04-05 LAB — CBC
HCT: 42.4 % (ref 36.0–46.0)
Hemoglobin: 14.3 g/dL (ref 12.0–15.0)
MCH: 32.4 pg (ref 26.0–34.0)
MCHC: 33.7 g/dL (ref 30.0–36.0)
MCV: 96.1 fL (ref 80.0–100.0)
Platelets: 227 10*3/uL (ref 150–400)
RBC: 4.41 MIL/uL (ref 3.87–5.11)
RDW: 13.1 % (ref 11.5–15.5)
WBC: 5.9 10*3/uL (ref 4.0–10.5)
nRBC: 0 % (ref 0.0–0.2)

## 2021-04-05 LAB — PROTIME-INR
INR: 1 (ref 0.8–1.2)
Prothrombin Time: 12.7 seconds (ref 11.4–15.2)

## 2021-04-05 LAB — COMPREHENSIVE METABOLIC PANEL
ALT: 19 U/L (ref 0–44)
AST: 27 U/L (ref 15–41)
Albumin: 4 g/dL (ref 3.5–5.0)
Alkaline Phosphatase: 58 U/L (ref 38–126)
Anion gap: 7 (ref 5–15)
BUN: 31 mg/dL — ABNORMAL HIGH (ref 8–23)
CO2: 28 mmol/L (ref 22–32)
Calcium: 9.1 mg/dL (ref 8.9–10.3)
Chloride: 104 mmol/L (ref 98–111)
Creatinine, Ser: 0.53 mg/dL (ref 0.44–1.00)
GFR, Estimated: >60 mL/min (ref >60)
Glucose, Bld: 90 mg/dL (ref 70–99)
Potassium: 4.5 mmol/L (ref 3.5–5.1)
Sodium: 139 mmol/L (ref 135–145)
Total Bilirubin: 0.6 mg/dL (ref 0.3–1.2)
Total Protein: 7.6 g/dL (ref 6.5–8.1)

## 2021-04-05 LAB — SURGICAL PCR SCREEN
MRSA, PCR: NEGATIVE
Staphylococcus aureus: NEGATIVE

## 2021-04-13 ENCOUNTER — Other Ambulatory Visit: Payer: Self-pay

## 2021-04-13 ENCOUNTER — Telehealth: Payer: Self-pay | Admitting: Family Medicine

## 2021-04-13 ENCOUNTER — Ambulatory Visit (INDEPENDENT_AMBULATORY_CARE_PROVIDER_SITE_OTHER): Payer: Medicare Other | Admitting: Podiatry

## 2021-04-13 DIAGNOSIS — M79675 Pain in left toe(s): Secondary | ICD-10-CM

## 2021-04-13 DIAGNOSIS — M79674 Pain in right toe(s): Secondary | ICD-10-CM | POA: Diagnosis not present

## 2021-04-13 DIAGNOSIS — B351 Tinea unguium: Secondary | ICD-10-CM

## 2021-04-13 NOTE — Telephone Encounter (Signed)
Seth Bake from [facility] called in wants to know was medical clearance receive for pt left knee replacement surgery  # [phone]

## 2021-04-14 ENCOUNTER — Other Ambulatory Visit: Payer: Self-pay | Admitting: Orthopedic Surgery

## 2021-04-14 LAB — SARS CORONAVIRUS 2 (TAT 6-24 HRS): SARS Coronavirus 2: NEGATIVE

## 2021-04-14 NOTE — Telephone Encounter (Signed)
Faxed clearance with office note and lab

## 2021-04-14 NOTE — Telephone Encounter (Signed)
Left Seth Bake a VM stating that I have not received the clearance form yet. Left her my fax number and direct number,.

## 2021-04-14 NOTE — Telephone Encounter (Signed)
Thank you, ladies!

## 2021-04-14 NOTE — Telephone Encounter (Signed)
Reviewed patient's chart including Dr. Verda Cumins last office visit which was for her [place].  Patient appears low risk for surgery. Form signed.  Recommend discontinuation of aspirin 5 days prior to surgery, can resume once surgery has completed and she is stable.   Please fax form with Dr. Verda Cumins last office visit notes and recent labs.

## 2021-04-14 NOTE — Progress Notes (Signed)
  Subjective:  Patient ID: Sherry Lamb, female    DOB: [birth year],  MRN: [id number]  Chief Complaint  Patient presents with   Nail Problem    Thick painful toenails, 3 month follow up    86 y.o. female presents with the above complaint. History confirmed with patient.  Her nails are thickened and elongated and painful.    Objective:  Physical Exam: warm, good capillary refill, no trophic changes or ulcerative lesions, normal DP and PT pulses and normal sensory exam.  She has thickened and elongated yellow toenails x10 with subungual debris Assessment:   1. Pain due to onychomycosis of toenails of both feet      Plan:  Patient was evaluated and treated and all questions answered.   Discussed the etiology and treatment options for the condition in detail with the patient. Educated patient on the topical and oral treatment options for mycotic nails. Recommended debridement of the nails today. Sharp and mechanical debridement performed of all painful and mycotic nails today. Nails debrided in length and thickness using a nail nipper to level of comfort. Discussed treatment options including appropriate shoe gear. Follow up as needed for painful nails.    Return in about 10 weeks (around 06/22/2021) for painful nails .

## 2021-04-14 NOTE — Telephone Encounter (Signed)
Received form and faxed over to [facility] for approval

## 2021-04-18 ENCOUNTER — Ambulatory Visit (HOSPITAL_COMMUNITY): Payer: Medicare Other | Admitting: Anesthesiology

## 2021-04-18 ENCOUNTER — Encounter (HOSPITAL_COMMUNITY): Payer: Self-pay | Admitting: Orthopedic Surgery

## 2021-04-18 ENCOUNTER — Encounter (HOSPITAL_COMMUNITY)
Admission: RE | Disposition: A | Discharge status: Home / Self Care | Payer: Self-pay | Source: Ambulatory Visit | Attending: Orthopedic Surgery

## 2021-04-18 ENCOUNTER — Other Ambulatory Visit: Payer: Self-pay

## 2021-04-18 ENCOUNTER — Observation Stay (HOSPITAL_COMMUNITY)
Admission: RE | Admit: 2021-04-18 | Discharge: 2021-04-19 | Disposition: A | Discharge status: Home / Self Care | Payer: Medicare Other | Source: Ambulatory Visit | Attending: Orthopedic Surgery | Admitting: Orthopedic Surgery

## 2021-04-18 DIAGNOSIS — Z79899 Other long term (current) drug therapy: Secondary | ICD-10-CM | POA: Diagnosis not present

## 2021-04-18 DIAGNOSIS — M1712 Unilateral primary osteoarthritis, left knee: Secondary | ICD-10-CM | POA: Diagnosis not present

## 2021-04-18 DIAGNOSIS — G8918 Other acute postprocedural pain: Secondary | ICD-10-CM | POA: Diagnosis not present

## 2021-04-18 HISTORY — PX: TOTAL KNEE ARTHROPLASTY: SHX125

## 2021-04-18 SURGERY — ARTHROPLASTY, KNEE, TOTAL
Anesthesia: Spinal | Site: Knee | Laterality: Left

## 2021-04-18 MED ORDER — METHOCARBAMOL 500 MG IVPB - SIMPLE MED
500.0000 mg | Freq: Four times a day (QID) | INTRAVENOUS | Status: DC | PRN
  Filled 2021-04-18: qty 50

## 2021-04-18 MED ORDER — BUPIVACAINE IN DEXTROSE 0.75-8.25 % IT SOLN
INTRATHECAL | Status: DC | PRN
  Administered 2021-04-18: 1.6 mL via INTRATHECAL

## 2021-04-18 MED ORDER — ONDANSETRON HCL 4 MG/2ML IJ SOLN
INTRAMUSCULAR | Status: DC | PRN
  Administered 2021-04-18: 4 mg via INTRAVENOUS

## 2021-04-18 MED ORDER — BUPIVACAINE LIPOSOME 1.3 % IJ SUSP
INTRAMUSCULAR | Status: AC
  Filled 2021-04-18: qty 20

## 2021-04-18 MED ORDER — MIDAZOLAM HCL 2 MG/2ML IJ SOLN: 1.0000 mg | INTRAMUSCULAR | Status: DC

## 2021-04-18 MED ORDER — PHENYLEPHRINE HCL (PRESSORS) 10 MG/ML IV SOLN
INTRAVENOUS | Status: AC
  Filled 2021-04-18: qty 1

## 2021-04-18 MED ORDER — SODIUM CHLORIDE (PF) 0.9 % IJ SOLN
INTRAMUSCULAR | Status: DC | PRN
  Administered 2021-04-18: 60 mL

## 2021-04-18 MED ORDER — MENTHOL 3 MG MT LOZG: 1.0000 | LOZENGE | OROMUCOSAL | Status: DC | PRN

## 2021-04-18 MED ORDER — CEFAZOLIN SODIUM-DEXTROSE 2-4 GM/100ML-% IV SOLN
2.0000 g | INTRAVENOUS | Status: AC
  Administered 2021-04-18: 2 g via INTRAVENOUS
  Filled 2021-04-18: qty 100

## 2021-04-18 MED ORDER — DOCUSATE SODIUM 100 MG PO CAPS
100.0000 mg | ORAL_CAPSULE | Freq: Two times a day (BID) | ORAL | Status: DC
  Administered 2021-04-18 – 2021-04-19 (×2): 100 mg via ORAL
  Filled 2021-04-18 (×2): qty 1

## 2021-04-18 MED ORDER — ISOPROPYL ALCOHOL 70 % SOLN
Status: AC
  Filled 2021-04-18: qty 480

## 2021-04-18 MED ORDER — ROPIVACAINE HCL 5 MG/ML IJ SOLN
INTRAMUSCULAR | Status: DC | PRN
  Administered 2021-04-18: 30 mL via PERINEURAL

## 2021-04-18 MED ORDER — TRAMADOL HCL 50 MG PO TABS: 50.0000 mg | ORAL_TABLET | Freq: Four times a day (QID) | ORAL | Status: DC | PRN

## 2021-04-18 MED ORDER — DIPHENHYDRAMINE HCL 12.5 MG/5ML PO ELIX: 12.5000 mg | ORAL_SOLUTION | ORAL | Status: DC | PRN

## 2021-04-18 MED ORDER — ONDANSETRON HCL 4 MG/2ML IJ SOLN: 4.0000 mg | Freq: Four times a day (QID) | INTRAMUSCULAR | Status: DC | PRN

## 2021-04-18 MED ORDER — SODIUM CHLORIDE 0.9 % IR SOLN
Status: DC | PRN
  Administered 2021-04-18: 1000 mL

## 2021-04-18 MED ORDER — POVIDONE-IODINE 10 % EX SWAB
2.0000 "application " | Freq: Once | CUTANEOUS | Status: AC
  Administered 2021-04-18: 2 via TOPICAL

## 2021-04-18 MED ORDER — METHOCARBAMOL 500 MG PO TABS: 500.0000 mg | ORAL_TABLET | Freq: Four times a day (QID) | ORAL | Status: DC | PRN

## 2021-04-18 MED ORDER — PHENOL 1.4 % MT LIQD: 1.0000 | OROMUCOSAL | Status: DC | PRN

## 2021-04-18 MED ORDER — ASPIRIN EC 325 MG PO TBEC
325.0000 mg | DELAYED_RELEASE_TABLET | Freq: Two times a day (BID) | ORAL | Status: DC
  Administered 2021-04-19: 325 mg via ORAL
  Filled 2021-04-18: qty 1

## 2021-04-18 MED ORDER — LACTATED RINGERS IV SOLN: INTRAVENOUS | Status: DC

## 2021-04-18 MED ORDER — PROPOFOL 500 MG/50ML IV EMUL
INTRAVENOUS | Status: DC | PRN
  Administered 2021-04-18: 40 ug/kg/min via INTRAVENOUS

## 2021-04-18 MED ORDER — ONDANSETRON HCL 4 MG/2ML IJ SOLN: 4.0000 mg | Freq: Once | INTRAMUSCULAR | Status: DC | PRN

## 2021-04-18 MED ORDER — DEXAMETHASONE SODIUM PHOSPHATE 10 MG/ML IJ SOLN
INTRAMUSCULAR | Status: DC | PRN
  Administered 2021-04-18: 8 mg via INTRAVENOUS

## 2021-04-18 MED ORDER — POLYETHYLENE GLYCOL 3350 17 G PO PACK: 17.0000 g | PACK | Freq: Every day | ORAL | Status: DC | PRN

## 2021-04-18 MED ORDER — TRANEXAMIC ACID-NACL 1000-0.7 MG/100ML-% IV SOLN
1000.0000 mg | INTRAVENOUS | Status: AC
  Administered 2021-04-18: 1000 mg via INTRAVENOUS
  Filled 2021-04-18: qty 100

## 2021-04-18 MED ORDER — CHLORHEXIDINE GLUCONATE 0.12 % MT SOLN
15.0000 mL | Freq: Once | OROMUCOSAL | Status: AC
  Administered 2021-04-18: 15 mL via OROMUCOSAL

## 2021-04-18 MED ORDER — METOCLOPRAMIDE HCL 5 MG/ML IJ SOLN: 5.0000 mg | Freq: Three times a day (TID) | INTRAMUSCULAR | Status: DC | PRN

## 2021-04-18 MED ORDER — PROPOFOL 1000 MG/100ML IV EMUL
INTRAVENOUS | Status: AC
  Filled 2021-04-18: qty 100

## 2021-04-18 MED ORDER — CEFAZOLIN SODIUM-DEXTROSE 2-4 GM/100ML-% IV SOLN
2.0000 g | Freq: Four times a day (QID) | INTRAVENOUS | Status: AC
  Administered 2021-04-18 – 2021-04-19 (×2): 2 g via INTRAVENOUS
  Filled 2021-04-18 (×2): qty 100

## 2021-04-18 MED ORDER — OXYCODONE HCL 5 MG PO TABS: 5.0000 mg | ORAL_TABLET | ORAL | Status: DC | PRN

## 2021-04-18 MED ORDER — ONDANSETRON HCL 4 MG PO TABS: 4.0000 mg | ORAL_TABLET | Freq: Four times a day (QID) | ORAL | Status: DC | PRN

## 2021-04-18 MED ORDER — OXYCODONE HCL 5 MG PO TABS: 5.0000 mg | ORAL_TABLET | Freq: Once | ORAL | Status: DC | PRN

## 2021-04-18 MED ORDER — AMISULPRIDE (ANTIEMETIC) 5 MG/2ML IV SOLN: 10.0000 mg | Freq: Once | INTRAVENOUS | Status: DC | PRN

## 2021-04-18 MED ORDER — MORPHINE SULFATE (PF) 2 MG/ML IV SOLN: 0.5000 mg | INTRAVENOUS | Status: DC | PRN

## 2021-04-18 MED ORDER — BUPIVACAINE LIPOSOME 1.3 % IJ SUSP
INTRAMUSCULAR | Status: DC | PRN
  Administered 2021-04-18: 20 mL

## 2021-04-18 MED ORDER — ACETAMINOPHEN 10 MG/ML IV SOLN
1000.0000 mg | Freq: Four times a day (QID) | INTRAVENOUS | Status: DC
Start: 2021-04-18 — End: 2021-04-18
  Administered 2021-04-18: 1000 mg via INTRAVENOUS
  Filled 2021-04-18: qty 100

## 2021-04-18 MED ORDER — FENTANYL CITRATE PF 50 MCG/ML IJ SOSY
50.0000 ug | PREFILLED_SYRINGE | INTRAMUSCULAR | Status: DC
  Administered 2021-04-18: 50 ug via INTRAVENOUS
  Filled 2021-04-18: qty 2

## 2021-04-18 MED ORDER — OXYCODONE HCL 5 MG/5ML PO SOLN: 5.0000 mg | Freq: Once | ORAL | Status: DC | PRN

## 2021-04-18 MED ORDER — METOCLOPRAMIDE HCL 5 MG PO TABS: 5.0000 mg | ORAL_TABLET | Freq: Three times a day (TID) | ORAL | Status: DC | PRN

## 2021-04-18 MED ORDER — FENTANYL CITRATE PF 50 MCG/ML IJ SOSY: 25.0000 ug | PREFILLED_SYRINGE | INTRAMUSCULAR | Status: DC | PRN

## 2021-04-18 MED ORDER — ONDANSETRON HCL 4 MG/2ML IJ SOLN
INTRAMUSCULAR | Status: AC
  Filled 2021-04-18: qty 2

## 2021-04-18 MED ORDER — STERILE WATER FOR IRRIGATION IR SOLN
Status: DC | PRN
  Administered 2021-04-18: 2000 mL

## 2021-04-18 MED ORDER — SODIUM CHLORIDE (PF) 0.9 % IJ SOLN
INTRAMUSCULAR | Status: AC
  Filled 2021-04-18: qty 10

## 2021-04-18 MED ORDER — DEXAMETHASONE SODIUM PHOSPHATE 10 MG/ML IJ SOLN
INTRAMUSCULAR | Status: AC
  Filled 2021-04-18: qty 1

## 2021-04-18 MED ORDER — BISACODYL 10 MG RE SUPP: 10.0000 mg | Freq: Every day | RECTAL | Status: DC | PRN

## 2021-04-18 MED ORDER — FLEET ENEMA 7-19 GM/118ML RE ENEM: 1.0000 | ENEMA | Freq: Once | RECTAL | Status: DC | PRN

## 2021-04-18 MED ORDER — ACETAMINOPHEN 500 MG PO TABS
1000.0000 mg | ORAL_TABLET | Freq: Four times a day (QID) | ORAL | Status: DC
  Administered 2021-04-18 – 2021-04-19 (×3): 1000 mg via ORAL
  Filled 2021-04-18 (×3): qty 2

## 2021-04-18 MED ORDER — PHENYLEPHRINE HCL-NACL 20-0.9 MG/250ML-% IV SOLN
INTRAVENOUS | Status: DC | PRN
  Administered 2021-04-18: 20 ug/min via INTRAVENOUS

## 2021-04-18 MED ORDER — ROSUVASTATIN CALCIUM 5 MG PO TABS: 5.0000 mg | ORAL_TABLET | Freq: Every day | ORAL | Status: DC

## 2021-04-18 MED ORDER — BUPIVACAINE LIPOSOME 1.3 % IJ SUSP: 20.0000 mL | Freq: Once | INTRAMUSCULAR | Status: DC

## 2021-04-18 MED ORDER — MIDAZOLAM HCL 2 MG/2ML IJ SOLN
INTRAMUSCULAR | Status: AC
  Administered 2021-04-18: 0.5 mg via INTRAVENOUS
  Filled 2021-04-18: qty 2

## 2021-04-18 MED ORDER — ORAL CARE MOUTH RINSE: 15.0000 mL | Freq: Once | OROMUCOSAL | Status: AC

## 2021-04-18 MED ORDER — SODIUM CHLORIDE 0.9 % IV SOLN: INTRAVENOUS | Status: DC

## 2021-04-18 SURGICAL SUPPLY — 57 items
BAG COUNTER SPONGE SURGICOUNT (BAG) IMPLANT
BAG SPEC THK2 15X12 ZIP CLS (MISCELLANEOUS) ×1
BAG SPNG CNTER NS LX DISP (BAG)
BAG SURGICOUNT SPONGE COUNTING (BAG)
BAG ZIPLOCK 12X15 (MISCELLANEOUS) ×3 IMPLANT
BLADE SAG 18X100X1.27 (BLADE) ×3 IMPLANT
BLADE SAW SGTL 11.0X1.19X90.0M (BLADE) ×3 IMPLANT
BNDG ELASTIC 6X5.8 VLCR STR LF (GAUZE/BANDAGES/DRESSINGS) ×3 IMPLANT
BOWL SMART MIX CTS (DISPOSABLE) ×3 IMPLANT
CEMENT HV SMART SET (Cement) ×6 IMPLANT
CEMENT TIBIA MBT (Knees) IMPLANT
CLOSURE WOUND 1/2 X4 (GAUZE/BANDAGES/DRESSINGS) ×1
COVER SURGICAL LIGHT HANDLE (MISCELLANEOUS) ×3 IMPLANT
CUFF TOURN SGL QUICK 34 (TOURNIQUET CUFF) ×3
CUFF TRNQT CYL 34X4.125X (TOURNIQUET CUFF) ×1 IMPLANT
DECANTER SPIKE VIAL GLASS SM (MISCELLANEOUS) ×3 IMPLANT
DRAPE INCISE IOBAN 66X45 STRL (DRAPES) ×3 IMPLANT
DRAPE U-SHAPE 47X51 STRL (DRAPES) ×3 IMPLANT
DRESSING AQUACEL AG SP 3.5X10 (GAUZE/BANDAGES/DRESSINGS) IMPLANT
DRSG AQUACEL AG ADV 3.5X10 (GAUZE/BANDAGES/DRESSINGS) ×3 IMPLANT
DRSG AQUACEL AG SP 3.5X10 (GAUZE/BANDAGES/DRESSINGS) ×3
DURAPREP 26ML APPLICATOR (WOUND CARE) ×3 IMPLANT
ELECT REM PT RETURN 15FT ADLT (MISCELLANEOUS) ×3 IMPLANT
GLOVE SRG 8 PF TXTR STRL LF DI (GLOVE) ×1 IMPLANT
GLOVE SURG ENC MOIS LTX SZ6.5 (GLOVE) ×3 IMPLANT
GLOVE SURG ENC MOIS LTX SZ8 (GLOVE) ×6 IMPLANT
GLOVE SURG UNDER POLY LF SZ7 (GLOVE) ×3 IMPLANT
GLOVE SURG UNDER POLY LF SZ8 (GLOVE) ×3
GLOVE SURG UNDER POLY LF SZ8.5 (GLOVE) ×3 IMPLANT
GOWN STRL REUS W/TWL LRG LVL3 (GOWN DISPOSABLE) ×6 IMPLANT
GOWN STRL REUS W/TWL XL LVL3 (GOWN DISPOSABLE) ×3 IMPLANT
HANDPIECE INTERPULSE COAX TIP (DISPOSABLE) ×3
HOLDER FOLEY CATH W/STRAP (MISCELLANEOUS) IMPLANT
IMMOBILIZER KNEE 20 (SOFTGOODS) ×3
IMMOBILIZER KNEE 20 THIGH 36 (SOFTGOODS) ×1 IMPLANT
IMPL FEMUR SIGMA LT PS SZ 3 (Knees) IMPLANT
IMPLANT FEMUR SIGMA LT PS SZ 3 (Knees) ×3 IMPLANT
INSERT TIBIAL PFC SIG SZ3 10MM (Knees) ×2 IMPLANT
KIT TURNOVER KIT A (KITS) IMPLANT
MANIFOLD NEPTUNE II (INSTRUMENTS) ×3 IMPLANT
NS IRRIG 1000ML POUR BTL (IV SOLUTION) ×3 IMPLANT
PACK TOTAL KNEE CUSTOM (KITS) ×3 IMPLANT
PADDING CAST COTTON 6X4 STRL (CAST SUPPLIES) ×4 IMPLANT
PATELLA DOME PFC 35MM (Knees) ×2 IMPLANT
PROTECTOR NERVE ULNAR (MISCELLANEOUS) ×3 IMPLANT
SET HNDPC FAN SPRY TIP SCT (DISPOSABLE) ×1 IMPLANT
STRIP CLOSURE SKIN 1/2X4 (GAUZE/BANDAGES/DRESSINGS) ×3 IMPLANT
SUT MNCRL AB 4-0 PS2 18 (SUTURE) ×3 IMPLANT
SUT STRATAFIX 0 PDS 27 VIOLET (SUTURE) ×3
SUT VIC AB 2-0 CT1 27 (SUTURE) ×9
SUT VIC AB 2-0 CT1 TAPERPNT 27 (SUTURE) ×3 IMPLANT
SUTURE STRATFX 0 PDS 27 VIOLET (SUTURE) ×1 IMPLANT
TIBIA MBT CEMENT (Knees) ×3 IMPLANT
TRAY FOLEY MTR SLVR 16FR STAT (SET/KITS/TRAYS/PACK) ×3 IMPLANT
TUBE SUCTION HIGH CAP CLEAR NV (SUCTIONS) ×3 IMPLANT
WATER STERILE IRR 1000ML POUR (IV SOLUTION) ×6 IMPLANT
WRAP KNEE MAXI GEL POST OP (GAUZE/BANDAGES/DRESSINGS) ×3 IMPLANT

## 2021-04-18 NOTE — Plan of Care (Signed)
  Problem: Education: Goal: Knowledge of General Education information will improve Description: Including pain rating scale, medication(s)/side effects and non-pharmacologic comfort measures Outcome: Progressing   Problem: Activity: Goal: Risk for activity intolerance will decrease Outcome: Progressing   Problem: Safety: Goal: Ability to remain free from injury will improve Outcome: Progressing   Problem: Activity: Goal: Ability to avoid complications of mobility impairment will improve Outcome: Progressing   Problem: Pain Management: Goal: Pain level will decrease with appropriate interventions Outcome: Progressing

## 2021-04-18 NOTE — Discharge Instructions (Signed)
Sherry Arabian, MD Total Joint Specialist [facility] [address] [place], [place] [address] [phone]  TOTAL KNEE REPLACEMENT POSTOPERATIVE DIRECTIONS    Knee Rehabilitation, Guidelines Following Surgery  Results after knee surgery are often greatly improved when you follow the exercise, range of motion and muscle strengthening exercises prescribed by your doctor. Safety measures are also important to protect the knee from further injury. If any of these exercises cause you to have increased pain or swelling in your knee joint, decrease the amount until you are comfortable again and slowly increase them. If you have problems or questions, call your caregiver or physical therapist for advice.   BLOOD CLOT PREVENTION Take a 325 mg Aspirin two times a day for three weeks following surgery. Then take an 81 mg Aspirin once a day for three weeks. Then resume taking as before surgery (one tablet twice a week). You may resume your vitamins/supplements upon discharge from the hospital. Do not take any NSAIDs (Advil, Aleve, Ibuprofen, Meloxicam, etc.) until you have discontinued the 325 mg Aspirin.   HOME CARE INSTRUCTIONS  Remove items at home which could result in a fall. This includes throw rugs or furniture in walking pathways.  ICE to the affected knee as much as tolerated. Icing helps control swelling. If the swelling is well controlled you will be more comfortable and rehab easier. Continue to use ice on the knee for pain and swelling from surgery. You may notice swelling that will progress down to the foot and ankle. This is normal after surgery. Elevate the leg when you are not up walking on it.    Continue to use the breathing machine which will help keep your temperature down. It is common for your temperature to cycle up and down following surgery, especially at night when you are not up moving around and exerting yourself. The breathing machine keeps  your lungs expanded and your temperature down. Do not place pillow under the operative knee, focus on keeping the knee straight while resting  DIET You may resume your previous home diet once you are discharged from the hospital.  DRESSING / WOUND CARE / SHOWERING Keep your bulky bandage on for 2 days. On the third post-operative day you may remove the Ace bandage and gauze. There is a waterproof adhesive bandage on your skin which will stay in place until your first follow-up appointment. Once you remove this you will not need to place another bandage You may begin showering 3 days following surgery, but do not submerge the incision under water.  ACTIVITY For the first 5 days, the key is rest and control of pain and swelling Do your home exercises twice a day starting on post-operative day 3. On the days you go to physical therapy, just do the home exercises once that day. You should rest, ice and elevate the leg for 50 minutes out of every hour. Get up and walk/stretch for 10 minutes per hour. After 5 days you can increase your activity slowly as tolerated. Walk with your walker as instructed. Use the walker until you are comfortable transitioning to a cane. Walk with the cane in the opposite hand of the operative leg. You may discontinue the cane once you are comfortable and walking steadily. Avoid periods of inactivity such as sitting longer than an hour when not asleep. This helps prevent blood clots.  You may discontinue the knee immobilizer once you are able to perform a straight leg raise while lying down.  You may resume a sexual relationship in one month or when given the OK by your doctor.  You may return to work once you are cleared by your doctor.  Do not drive a car for 6 weeks or until released by your surgeon.  Do not drive while taking narcotics.  TED HOSE STOCKINGS Wear the elastic stockings on both legs for three weeks following surgery during the day. You may remove them at  night for sleeping.  WEIGHT BEARING Weight bearing as tolerated with assist device (walker, cane, etc) as directed, use it as long as suggested by your surgeon or therapist, typically at least 4-6 weeks.  POSTOPERATIVE CONSTIPATION PROTOCOL Constipation - defined medically as fewer than three stools per week and severe constipation as less than one stool per week.  One of the most common issues patients have following surgery is constipation.  Even if you have a regular bowel pattern at home, your normal regimen is likely to be disrupted due to multiple reasons following surgery.  Combination of anesthesia, postoperative narcotics, change in appetite and fluid intake all can affect your bowels.  In order to avoid complications following surgery, here are some recommendations in order to help you during your recovery period.  Colace (docusate) - Pick up an over-the-counter form of Colace or another stool softener and take twice a day as long as you are requiring postoperative pain medications.  Take with a full glass of water daily.  If you experience loose stools or diarrhea, hold the colace until you stool forms back up. If your symptoms do not get better within 1 week or if they get worse, check with your doctor. Dulcolax (bisacodyl) - Pick up over-the-counter and take as directed by the product packaging as needed to assist with the movement of your bowels.  Take with a full glass of water.  Use this product as needed if not relieved by Colace only.  MiraLax (polyethylene glycol) - Pick up over-the-counter to have on hand. MiraLax is a solution that will increase the amount of water in your bowels to assist with bowel movements.  Take as directed and can mix with a glass of water, juice, soda, coffee, or tea. Take if you go more than two days without a movement. Do not use MiraLax more than once per day. Call your doctor if you are still constipated or irregular after using this medication for 7 days  in a row.  If you continue to have problems with postoperative constipation, please contact the office for further assistance and recommendations.  If you experience "the worst abdominal pain ever" or develop nausea or vomiting, please contact the office immediatly for further recommendations for treatment.  ITCHING If you experience itching with your medications, try taking only a single pain pill, or even half a pain pill at a time.  You can also use Benadryl over the counter for itching or also to help with sleep.   MEDICATIONS See your medication summary on the "After Visit Summary" that the nursing staff will review with you prior to discharge.  You may have some home medications which will be placed on hold until you complete the course of blood thinner medication.  It is important for you to complete the blood thinner medication as prescribed by your surgeon.  Continue your approved medications as instructed at time of discharge.  PRECAUTIONS If you experience chest pain or shortness of breath - call 911 immediately for transfer to the hospital emergency department.  If you develop a fever greater that 101 F, purulent drainage from wound, increased redness or drainage from wound, foul odor from the wound/dressing, or calf pain - CONTACT YOUR SURGEON.                                                   FOLLOW-UP APPOINTMENTS Make sure you keep all of your appointments after your operation with your surgeon and caregivers. You should call the office at the above phone number and make an appointment for approximately two weeks after the date of your surgery or on the date instructed by your surgeon outlined in the "After Visit Summary".  RANGE OF MOTION AND STRENGTHENING EXERCISES  Rehabilitation of the knee is important following a knee injury or an operation. After just a few days of immobilization, the muscles of the thigh which control the knee become weakened and shrink (atrophy). Knee  exercises are designed to build up the tone and strength of the thigh muscles and to improve knee motion. Often times heat used for twenty to thirty minutes before working out will loosen up your tissues and help with improving the range of motion but do not use heat for the first two weeks following surgery. These exercises can be done on a training (exercise) mat, on the floor, on a table or on a bed. Use what ever works the best and is most comfortable for you Knee exercises include:  Leg Lifts - While your knee is still immobilized in a splint or cast, you can do straight leg raises. Lift the leg to 60 degrees, hold for 3 sec, and slowly lower the leg. Repeat 10-20 times 2-3 times daily. Perform this exercise against resistance later as your knee gets better.  Quad and Hamstring Sets - Tighten up the muscle on the front of the thigh (Quad) and hold for 5-10 sec. Repeat this 10-20 times hourly. Hamstring sets are done by pushing the foot backward against an object and holding for 5-10 sec. Repeat as with quad sets.  Leg Slides: Lying on your back, slowly slide your foot toward your buttocks, bending your knee up off the floor (only go as far as is comfortable). Then slowly slide your foot back down until your leg is flat on the floor again. Angel Wings: Lying on your back spread your legs to the side as far apart as you can without causing discomfort.  A rehabilitation program following serious knee injuries can speed recovery and prevent re-injury in the future due to weakened muscles. Contact your doctor or a physical therapist for more information on knee rehabilitation.   POST-OPERATIVE OPIOID TAPER INSTRUCTIONS: It is important to wean off of your opioid medication as soon as possible. If you do not need pain medication after your surgery it is ok to stop day one. Opioids include: Codeine, Hydrocodone(Norco, Vicodin), Oxycodone(Percocet, oxycontin) and hydromorphone amongst others.  Long term and  even short term use of opiods can cause: Increased pain response Dependence Constipation Depression Respiratory depression And more.  Withdrawal symptoms can include Flu like symptoms Nausea, vomiting And more Techniques to manage these symptoms Hydrate well Eat regular healthy meals Stay active Use relaxation techniques(deep breathing, meditating, yoga) Do Not substitute Alcohol to help with tapering If you have been on opioids for less than two weeks and do not have pain than  it is ok to stop all together.  Plan to wean off of opioids This plan should start within one week post op of your joint replacement. Maintain the same interval or time between taking each dose and first decrease the dose.  Cut the total daily intake of opioids by one tablet each day Next start to increase the time between doses. The last dose that should be eliminated is the evening dose.   IF YOU ARE TRANSFERRED TO A SKILLED REHAB FACILITY If the patient is transferred to a skilled rehab facility following release from the hospital, a list of the current medications will be sent to the facility for the patient to continue.  When discharged from the skilled rehab facility, please have the facility set up the patient's [place] prior to being released. Also, the skilled facility will be responsible for providing the patient with their medications at time of release from the facility to include their pain medication, the muscle relaxants, and their blood thinner medication. If the patient is still at the rehab facility at time of the two week follow up appointment, the skilled rehab facility will also need to assist the patient in arranging follow up appointment in our office and any transportation needs.  MAKE SURE YOU:  Understand these instructions.  Get help right away if you are not doing well or get worse.   DENTAL ANTIBIOTICS:  In most cases prophylactic antibiotics for Dental  procdeures after total joint surgery are not necessary.  Exceptions are as follows:  1. History of prior total joint infection  2. Severely immunocompromised (Organ Transplant, cancer chemotherapy, Rheumatoid biologic meds such as Newman)  3. Poorly controlled diabetes (A1C &gt; 8.0, blood glucose over 200)  If you have one of these conditions, contact your surgeon for an antibiotic prescription, prior to your dental procedure.    Pick up stool softner and laxative for home use following surgery while on pain medications. Do not submerge incision under water. Please use good hand washing techniques while changing dressing each day. May shower starting three days after surgery. Please use a clean towel to pat the incision dry following showers. Continue to use ice for pain and swelling after surgery. Do not use any lotions or creams on the incision until instructed by your surgeon.

## 2021-04-18 NOTE — H&P (Signed)
TOTAL KNEE ADMISSION H&P  Patient is being admitted for left total knee arthroplasty.  Subjective:  Chief Complaint: Left knee pain.  HPI: Sherry Lamb, 86 y.o. female has a history of pain and functional disability in the left knee due to arthritis and has failed non-surgical conservative treatments for greater than 12 weeks to include NSAID's and/or analgesics, flexibility and strengthening excercises, and activity modification. Onset of symptoms was gradual, starting  several  years ago with gradually worsening course since that time. The patient noted no past surgery on the left knee.  Patient currently rates pain in the left knee at 6 out of 10 with activity. Patient has worsening of pain with activity and weight bearing, pain that interferes with activities of daily living, pain with passive range of motion, and crepitus. Patient has evidence of periarticular osteophytes and joint space narrowing by imaging studies. There is no active infection.  Patient Active Problem List   Diagnosis Date Noted   Weight loss 10/25/2020   Osteoporosis 07/07/2020   Primary osteoarthritis of right knee 02/23/2020   Lumbar radiculopathy 09/27/2018   Osteoarthritis of knee 08/26/2018   Degeneration of lumbar intervertebral disc 08/20/2018   Bilateral chronic knee pain 06/06/2018   Hyperlipidemia    GERD (gastroesophageal reflux disease)    Frequent headaches    Arthritis     Past Medical History:  Diagnosis Date   Arthritis    Frequent headaches    GERD (gastroesophageal reflux disease)    Hyperlipidemia    Pneumonia    PONV (postoperative nausea and vomiting)     Past Surgical History:  Procedure Laterality Date   ABDOMINAL HYSTERECTOMY     ANKLE SURGERY     EYE SURGERY     Left hip replacement Left 2013   right hip replacement Right 2000   TONSILLECTOMY AND ADENOIDECTOMY  1943   TOTAL KNEE ARTHROPLASTY Right 02/23/2020   Procedure: TOTAL KNEE ARTHROPLASTY;  Surgeon: Gaynelle Arabian,  MD;  Location: [facility];  Service: Orthopedics;  Laterality: Right;  26min   WISDOM TOOTH EXTRACTION      Prior to Admission medications   Medication Sig Start Date End Date Taking? Authorizing Provider  aspirin 81 MG EC tablet Take 81 mg by mouth 2 (two) times a week. Swallow whole.   Yes [provider]  Biotin 5000 MCG TABS Take 10,000 mcg by mouth daily.   Yes [provider]  Cholecalciferol (VITAMIN D) 50 MCG (2000 UT) CAPS Take 2,000 Units by mouth 2 (two) times a week.   Yes [provider]  diclofenac Sodium (VOLTAREN) 1 % GEL Apply 1 application topically daily as needed (pain).   Yes [provider]  naproxen sodium (ALEVE) 220 MG tablet Take 440 mg by mouth daily as needed (pain).   Yes [provider]  Polyvinyl Alcohol-Povidone (REFRESH OP) Place 1 drop into both eyes 2 (two) times daily.   Yes [provider]  rosuvastatin (CRESTOR) 5 MG tablet TAKE 1 TABLET DAILY 03/22/21  Yes Lesleigh Noe, MD  Turmeric 500 MG CAPS Take 500 mg by mouth daily.   Yes [provider]    Allergies  Allergen Reactions   Penicillins Other (See Comments)    Stomach tightness.  Tolerated Cephalosporin Date: 02/24/20.     Prednisone Other (See Comments)    GI intolerance    Social History   Socioeconomic History   Marital status: Married    Spouse name: [name]   Number of children: 1  Years of education: secretarial school   Highest education level: Not on file  Occupational History   Not on file  Tobacco Use   Smoking status: Never   Smokeless tobacco: Never  Vaping Use   Vaping Use: Never used  Substance and Sexual Activity   Alcohol use: Yes    Alcohol/week: 1.0 standard drink    Types: 1 Glasses of wine per week    Comment: 1 wine day    Drug use: Never   Sexual activity: Yes    Partners: Male    Birth control/protection: Post-menopausal, Surgical    Comment: Hysterectomy  Other Topics Concern   Not on  file  Social History Narrative   07/07/20   From: [place], [place] to be near family   Living: with husband [name] (1966)   Work: retired from music business - record and Writer      Family: daughter - Anderson [place] - no children      Enjoys: play tennis (though cannot play), reading, music      Exercise: walking, tries to do her knee therapy   Diet: generally      Safety   Seat belts: Yes    Guns: No   Safe in relationships: Yes    Social Determinants of Radio broadcast assistant Strain: Not on file  Food Insecurity: Not on file  Transportation Needs: Not on file  Physical Activity: Not on file  Stress: Not on file  Social Connections: Not on file  Intimate Partner Violence: Not on file    Tobacco Use: Low Risk    Smoking Tobacco Use: Never   Smokeless Tobacco Use: Never   Passive Exposure: Not on file   Social History   Substance and Sexual Activity  Alcohol Use Yes   Alcohol/week: 1.0 standard drink   Types: 1 Glasses of wine per week   Comment: 1 wine day     Family History  Problem Relation Age of Onset   Arthritis Mother    Asthma Mother    Hearing loss Mother    Osteoporosis Mother    Heart disease Father    Hyperlipidemia Father    Kidney disease Father    Depression Brother    Hyperlipidemia Brother    Arrhythmia Brother        Psychologist, forensic   Breast cancer Neg Hx     ROS: Constitutional: no fever, no chills, no night sweats, no significant weight loss Cardiovascular: no chest pain, no palpitations Respiratory: no cough, no shortness of breath, No COPD Gastrointestinal: no vomiting, no nausea Musculoskeletal: no swelling in Joints, Joint Pain Neurologic: no numbness, no tingling, no difficulty with balance  Objective:  Physical Exam: Well nourished and well developed.  General: Alert and oriented x3, cooperative and pleasant, no acute distress.  Head: normocephalic, atraumatic, neck supple.  Eyes: EOMI.  Respiratory: breath  sounds clear in all fields, no wheezing, rales, or rhonchi. Cardiovascular: Regular rate and rhythm, no murmurs, gallops or rubs.  Abdomen: non-tender to palpation and soft, normoactive bowel sounds. Musculoskeletal:  Antalgic gait pattern favoring the left side without the use of assisted devices.     Left Knee Exam:   No effusion.   Valgus deformity.   Range of motion is 5 to 125 degrees.   Significant crepitus on range of motion of the knee.   Positive lateral greater than medial joint line tenderness.   Stable knee.  Left Hip Exam:  Mild tenderness to palpation about  the left greater trochanteric bursa.  No pain with passive and active motion of the left hip.  Full ROM  Calves soft and nontender. Motor function intact in LE. Strength 5/5 LE bilaterally. Neuro: Distal pulses 2+. Sensation to light touch intact in LE.  Vital signs in last 24 hours: Temp:  [97.8 F (36.6 C)] 97.8 F (36.6 C) (12/12 0905) Pulse Rate:  [100] 100 (12/12 0904) Resp:  [18] 18 (12/12 0904) BP: (135)/(76) 135/76 (12/12 0904) SpO2:  [97 %] 97 % (12/12 0904) Weight:  [63.4 kg] 63.4 kg (12/12 0904)  Imaging Review Radiographs- AP and lateral of the left knee obtained 1 year ago demonstrate severe bone-on-bone arthritis in the lateral compartment with valgus deformity and patellofemoral bone-on-bone also.  Assessment/Plan:  End stage arthritis, left knee   The patient history, physical examination, clinical judgment of the provider and imaging studies are consistent with end stage degenerative joint disease of the left knee and total knee arthroplasty is deemed medically necessary. The treatment options including medical management, injection therapy arthroscopy and arthroplasty were discussed at length. The risks and benefits of total knee arthroplasty were presented and reviewed. The risks due to aseptic loosening, infection, stiffness, patella tracking problems, thromboembolic complications and other  imponderables were discussed. The patient acknowledged the explanation, agreed to proceed with the plan and consent was signed. Patient is being admitted for inpatient treatment for surgery, pain control, PT, OT, prophylactic antibiotics, VTE prophylaxis, progressive ambulation and ADLs and discharge planning. The patient is planning to be discharged  home .   Patient's anticipated LOS is less than 2 midnights, meeting these requirements: - Younger than 40 - Lives within 1 hour of care - Has a competent adult at home to recover with post-op recover - NO history of  - Chronic pain requiring opiods  - Diabetes  - Coronary Artery Disease  - Heart failure  - Heart attack  - Stroke  - DVT/VTE  - Cardiac arrhythmia  - Respiratory Failure/COPD  - Renal failure  - Anemia  - Advanced Liver disease   Therapy Plans: [facility] in [place] Disposition: Home with Husband Planned DVT Prophylaxis: Aspirin 325mg  DME Needed: None PCP: Waunita Schooner  TXA: IV Allergies: PCN, Prednisone Anesthesia Concerns: Nausea BMI: 24.6 Last HgbA1c: n/a  Pharmacy: [facility] in [place]   - Patient was instructed on what medications to stop prior to surgery. - Follow-up visit in 2 weeks with Dr. Wynelle Link - Begin physical therapy following surgery - Pre-operative lab work as pre-surgical testing - Prescriptions will be provided in hospital at time of discharge  Fenton Foy, [facility], PA-C Orthopedic Surgery [facility]

## 2021-04-18 NOTE — Anesthesia Preprocedure Evaluation (Signed)
Anesthesia Evaluation  Patient identified by MRN, date of birth, ID band Patient awake    Reviewed: Allergy & Precautions, NPO status , Patient's Chart, lab work & pertinent test results  History of Anesthesia Complications Negative for: history of anesthetic complications  Airway Mallampati: II  TM Distance: >3 FB Neck ROM: Full    Dental   Pulmonary neg pulmonary ROS,    Pulmonary exam normal        Cardiovascular Normal cardiovascular exam  HLD   Neuro/Psych negative neurological ROS     GI/Hepatic Neg liver ROS, GERD  ,  Endo/Other  negative endocrine ROS  Renal/GU negative Renal ROS  negative genitourinary   Musculoskeletal  (+) Arthritis , Osteoarthritis,    Abdominal   Peds  Hematology negative hematology ROS (+)   Anesthesia Other Findings   Reproductive/Obstetrics                            Anesthesia Physical Anesthesia Plan  ASA: 2  Anesthesia Plan: Spinal   Post-op Pain Management: Regional block, Tylenol PO (pre-op) and Toradol IV (intra-op)   Induction:   PONV Risk Score and Plan: 2 and Propofol infusion, Treatment may vary due to age or medical condition, Ondansetron and TIVA  Airway Management Planned: Nasal Cannula and Simple Face Mask  Additional Equipment: None  Intra-op Plan:   Post-operative Plan:   Informed Consent: I have reviewed the patients History and Physical, chart, labs and discussed the procedure including the risks, benefits and alternatives for the proposed anesthesia with the patient or authorized representative who has indicated his/her understanding and acceptance.       Plan Discussed with:   Anesthesia Plan Comments:         Anesthesia Quick Evaluation

## 2021-04-18 NOTE — Progress Notes (Signed)
AssistedDr. Carolyn Witman with left, ultrasound guided, adductor canal block. Side rails up, monitors on throughout procedure. See vital signs in flow sheet. Tolerated Procedure well.  

## 2021-04-18 NOTE — Anesthesia Postprocedure Evaluation (Signed)
Anesthesia Post Note  Patient: Sherry Lamb  Procedure(s) Performed: TOTAL KNEE ARTHROPLASTY (Left: Knee)     Patient location during evaluation: PACU Anesthesia Type: Spinal Level of consciousness: oriented and awake and alert Pain management: pain level controlled Vital Signs Assessment: post-procedure vital signs reviewed and stable Respiratory status: spontaneous breathing, respiratory function stable and nonlabored ventilation Cardiovascular status: blood pressure returned to baseline and stable Postop Assessment: no headache, no backache, no apparent nausea or vomiting and spinal receding Anesthetic complications: no   No notable events documented.  Last Vitals:  Vitals:   04/18/21 1345 04/18/21 1420  BP: 118/70 127/74  Pulse: 63 88  Resp: 11 16  Temp: (!) 36.4 C 36.4 C  SpO2: 98% 95%    Last Pain:  Vitals:   04/18/21 1420  TempSrc: Oral  PainSc: 0-No pain                 Lidia Collum

## 2021-04-18 NOTE — Interval H&P Note (Signed)
History and Physical Interval Note:  04/18/2021 9:57 AM  Sherry Lamb  has presented today for surgery, with the diagnosis of left knee osteoarthritis.  The various methods of treatment have been discussed with the patient and family. After consideration of risks, benefits and other options for treatment, the patient has consented to  Procedure(s): TOTAL KNEE ARTHROPLASTY (Left) as a surgical intervention.  The patient's history has been reviewed, patient examined, no change in status, stable for surgery.  I have reviewed the patient's chart and labs.  Questions were answered to the patient's satisfaction.     Pilar Plate Odin Mariani

## 2021-04-18 NOTE — Transfer of Care (Signed)
Immediate Anesthesia Transfer of Care Note  Patient: Sherry Lamb  Procedure(s) Performed: TOTAL KNEE ARTHROPLASTY (Left: Knee)  Patient Location: PACU  Anesthesia Type:MAC and Spinal  Level of Consciousness: awake, alert , oriented and patient cooperative  Airway & Oxygen Therapy: Patient Spontanous Breathing and Patient connected to face mask oxygen  Post-op Assessment: Report given to RN and Post -op Vital signs reviewed and stable  Post vital signs: Reviewed and stable  Last Vitals:  Vitals Value Taken Time  BP 110/60 04/18/21 1237  Temp    Pulse 78 04/18/21 1239  Resp 22 04/18/21 1239  SpO2 100 % 04/18/21 1239  Vitals shown include unvalidated device data.  Last Pain:  Vitals:   04/18/21 1030  PainSc: 0-No pain      Patients Stated Pain Goal: 4 (01/56/15 3794)  Complications: No notable events documented.

## 2021-04-18 NOTE — Op Note (Signed)
OPERATIVE REPORT-TOTAL KNEE ARTHROPLASTY   Pre-operative diagnosis- Osteoarthritis  Left knee(s)  Post-operative diagnosis- Osteoarthritis Left knee(s)  Procedure-  Left  Total Knee Arthroplasty  Surgeon- Sherry Plover. Brice Kossman, MD  Assistant- Molli Barrows, PA-C   Anesthesia-   Adductor canal block and spinal  EBL-25 ml   Drains None  Tourniquet time-  Total Tourniquet Time Documented: Thigh (Left) - 36 minutes Total: Thigh (Left) - 36 minutes     Complications- None  Condition-PACU - hemodynamically stable.   Brief Clinical Note  Sherry Lamb is a 86 y.o. year old female with end stage OA of her left knee with progressively worsening pain and dysfunction. She has constant pain, with activity and at rest and significant functional deficits with difficulties even with ADLs. She has had extensive non-op management including analgesics, injections of cortisone and viscosupplements, and home exercise program, but remains in significant pain with significant dysfunction. Radiographs show bone on bone arthritis medial and patellofemoral. She presents now for left Total Knee Arthroplasty.     Procedure in detail---   The patient is brought into the operating room and positioned supine on the operating table. After successful administration of  Adductor canal block and spinal,   a tourniquet is placed high on the  Left thigh(s) and the lower extremity is prepped and draped in the usual sterile fashion. Time out is performed by the operating team and then the  Left lower extremity is wrapped in Esmarch, knee flexed and the tourniquet inflated to 300 mmHg.       A midline incision is made with a ten blade through the subcutaneous tissue to the level of the extensor mechanism. A fresh blade is used to make a medial parapatellar arthrotomy. Soft tissue over the proximal medial tibia is subperiosteally elevated to the joint line with a knife and into the semimembranosus bursa with a Cobb  elevator. Soft tissue over the proximal lateral tibia is elevated with attention being paid to avoiding the patellar tendon on the tibial tubercle. The patella is everted, knee flexed 90 degrees and the ACL and PCL are removed. Findings are bone on bone medial and patellofemoral with large global osteophytes.        The drill is used to create a starting hole in the distal femur and the canal is thoroughly irrigated with sterile saline to remove the fatty contents. The 5 degree Left  valgus alignment guide is placed into the femoral canal and the distal femoral cutting block is pinned to remove 10 mm off the distal femur. Resection is made with an oscillating saw.      The tibia is subluxed forward and the menisci are removed. The extramedullary alignment guide is placed referencing proximally at the medial aspect of the tibial tubercle and distally along the second metatarsal axis and tibial crest. The block is pinned to remove 47mm off the more deficient medial  side. Resection is made with an oscillating saw. Size 3is the most appropriate size for the tibia and the proximal tibia is prepared with the modular drill and keel punch for that size.      The femoral sizing guide is placed and size 3 is most appropriate. Rotation is marked off the epicondylar axis and confirmed by creating a rectangular flexion gap at 90 degrees. The size 3 cutting block is pinned in this rotation and the anterior, posterior and chamfer cuts are made with the oscillating saw. The intercondylar block is then placed and that cut is made.  Trial size 3 tibial component, trial size 3 posterior stabilized femur and a 22  mm posterior stabilized rotating platform insert trial is placed. Full extension is achieved with excellent varus/valgus and anterior/posterior balance throughout full range of motion. The patella is everted and thickness measured to be 22  mm. Free hand resection is taken to 12 mm, a 35 template is placed, lug holes  are drilled, trial patella is placed, and it tracks normally. Osteophytes are removed off the posterior femur with the trial in place. All trials are removed and the cut bone surfaces prepared with pulsatile lavage. Cement is mixed and once ready for implantation, the size 3 tibial implant, size  3 posterior stabilized femoral component, and the size 35 patella are cemented in place and the patella is held with the clamp. The trial insert is placed and the knee held in full extension. The Exparel (20 ml mixed with 60 ml saline) is injected into the extensor mechanism, posterior capsule, medial and lateral gutters and subcutaneous tissues.  All extruded cement is removed and once the cement is hard the permanent 10 mm posterior stabilized rotating platform insert is placed into the tibial tray.      The wound is copiously irrigated with saline solution and the extensor mechanism closed with # 0 Stratofix suture. The tourniquet is released for a total tourniquet time of 36  minutes. Flexion against gravity is 140 degrees and the patella tracks normally. Subcutaneous tissue is closed with 2.0 vicryl and subcuticular with running 4.0 Monocryl. The incision is cleaned and dried and steri-strips and a bulky sterile dressing are applied. The limb is placed into a knee immobilizer and the patient is awakened and transported to recovery in stable condition.      Please note that a surgical assistant was a medical necessity for this procedure in order to perform it in a safe and expeditious manner. Surgical assistant was necessary to retract the ligaments and vital neurovascular structures to prevent injury to them and also necessary for proper positioning of the limb to allow for anatomic placement of the prosthesis.   Sherry Plover Yuna Pizzolato, MD    04/18/2021, 12:08 PM

## 2021-04-18 NOTE — Anesthesia Procedure Notes (Signed)
Anesthesia Regional Block: Adductor canal block   Pre-Anesthetic Checklist: , timeout performed,  Correct Patient, Correct Site, Correct Laterality,  Correct Procedure, Correct Position, site marked,  Risks and benefits discussed,  Surgical consent,  Pre-op evaluation,  At surgeon's request and post-op pain management  Laterality: Left  Prep: chloraprep       Needles:  Injection technique: Single-shot  Needle Type: Echogenic Stimulator Needle     Needle Length: 10cm  Needle Gauge: 20     Additional Needles:   Procedures:,,,, ultrasound used (permanent image in chart),,    Narrative:  Start time: 04/18/2021 10:25 AM End time: 04/18/2021 10:29 AM Injection made incrementally with aspirations every 5 mL.  Performed by: Personally  Anesthesiologist: Lidia Collum, MD  Additional Notes: Standard monitors applied. Skin prepped. Good needle visualization with ultrasound. Injection made in 5cc increments with no resistance to injection. Patient tolerated the procedure well.

## 2021-04-18 NOTE — Progress Notes (Signed)
Orthopedic Tech Progress Note Patient Details:  Sherry Lamb [birth year] [id number]  CPM Left Knee Left Knee Flexion (Degrees): 40 Left Knee Extension (Degrees): 10  Post Interventions Patient Tolerated: Well  Vernona Rieger 04/18/2021, 2:31 PM

## 2021-04-18 NOTE — Anesthesia Procedure Notes (Signed)
Spinal  Start time: 04/18/2021 10:56 AM End time: 04/18/2021 11:01 AM Staffing Resident/CRNA: ,  A, CRNA Preanesthetic Checklist Completed: patient identified, IV checked, site marked, risks and benefits discussed, surgical consent, monitors and equipment checked, pre-op evaluation and timeout performed Spinal Block Patient position: sitting Prep: DuraPrep and site prepped and draped Patient monitoring: heart rate, continuous pulse ox and blood pressure Approach: midline Location: L3-4 Injection technique: single-shot Needle Needle type: Pencan  Needle gauge: 24 G Needle length: 10 cm Assessment Sensory level: T4 Events: CSF return Additional Notes Pt placed in sitting position for spinal placement. Spinal kit expiration date checked and verified. Sterile prep and drape of back. One attempt by CRNA. + clear CSF, - heme. Pt tolerated well.     

## 2021-04-18 NOTE — Evaluation (Signed)
Physical Therapy Evaluation Patient Details Name: Sherry Lamb MRN: [id number] DOB: 1934-05-22 Today's Date: 04/18/2021  History of Present Illness  Patient is 86 y.o. female s/p Lt TKA on 04/18/21 with PMH significant for OA, GERD, HLD, bil THA, Rt TKA in 2021.  Clinical Impression  Sherry Lamb is a 86 y.o. female POD 0 s/p Lt TKA. Patient reports independence with mobility at baseline. Patient is now limited by functional impairments (see PT problem list below) and requires min guard/assist for transfers and gait with RW. Patient was able to ambulate ~30 feet with RW and min guard/assist. Patient instructed in exercise to facilitate circulation to manage edema and reduce risk of DVT. Patient will benefit from continued skilled PT interventions to address impairments and progress towards PLOF. Acute PT will follow to progress mobility and stair training in preparation for safe discharge home.        Recommendations for follow up therapy are one component of a multi-disciplinary discharge planning process, led by the attending physician.  Recommendations may be updated based on patient status, additional functional criteria and insurance authorization.  Follow Up Recommendations Follow physician's recommendations for discharge plan and follow up therapies    Assistance Recommended at Discharge Frequent or constant Supervision/Assistance  Functional Status Assessment Patient has had a recent decline in their functional status and demonstrates the ability to make significant improvements in function in a reasonable and predictable amount of time.  Equipment Recommendations  None recommended by PT    Recommendations for Other Services       Precautions / Restrictions Precautions Precautions: Fall Restrictions Weight Bearing Restrictions: No LLE Weight Bearing: Weight bearing as tolerated      Mobility  Bed Mobility Overal bed mobility: Needs Assistance Bed Mobility: Supine to Sit      Supine to sit: Min guard;HOB elevated     General bed mobility comments: pt taking extra time and using bed rail    Transfers Overall transfer level: Needs assistance Equipment used: Rolling walker (2 wheels) Transfers: Sit to/from Stand Sit to Stand: Min assist           General transfer comment: cues for hand placement, pt required bil UE use for power up, assist to steady.    Ambulation/Gait Ambulation/Gait assistance: Min guard;Min assist Gait Distance (Feet): 30 Feet Assistive device: Rolling walker (2 wheels) Gait Pattern/deviations: Step-to pattern;Decreased stride length;Decreased weight shift to left;Decreased stance time - left Gait velocity: decr     General Gait Details: cues for step pattern, no overt LOB or buckling at Lt knee. assist to manage walker position initially and progressed to min guard. distance limited by pain.  Stairs            Wheelchair Mobility    Modified Rankin (Stroke Patients Only)       Balance Overall balance assessment: Needs assistance Sitting-balance support: Feet supported Sitting balance-Leahy Scale: Good     Standing balance support: Reliant on assistive device for balance;During functional activity;Bilateral upper extremity supported Standing balance-Leahy Scale: Poor                               Pertinent Vitals/Pain Pain Assessment: 0-10 Pain Score: 5  Pain Location: Lt knee Pain Descriptors / Indicators: Aching;Discomfort;Sore Pain Intervention(s): Limited activity within patient's tolerance;Monitored during session;Repositioned;Ice applied    Home Living Family/patient expects to be discharged to:: Private residence Living Arrangements: Spouse/significant other Available Help at Discharge: Family Type of  Home: House Home Access: Stairs to enter Entrance Stairs-Rails: Left (Rt pull bar by the door frame) Entrance Stairs-Number of Steps: 3   Home Layout: One level Home Equipment:  Conservation officer, nature (2 wheels);Cane - single point;Toilet riser      Prior Function Prior Level of Function : Independent/Modified Independent                     Hand Dominance   Dominant Hand: Right    Extremity/Trunk Assessment        Lower Extremity Assessment Lower Extremity Assessment: LLE deficits/detail LLE Deficits / Details: good quad activation, no extensor lag wtih SLR LLE Sensation: WNL LLE Coordination: WNL    Cervical / Trunk Assessment Cervical / Trunk Assessment: Normal  Communication   Communication: No difficulties  Cognition Arousal/Alertness: Awake/alert Behavior During Therapy: WFL for tasks assessed/performed Overall Cognitive Status: Within Functional Limits for tasks assessed                                          General Comments      Exercises Total Joint Exercises Ankle Circles/Pumps: AROM;Both;20 reps;Seated   Assessment/Plan    PT Assessment Patient needs continued PT services  PT Problem List Decreased strength;Decreased range of motion;Decreased activity tolerance;Decreased balance;Decreased mobility;Decreased knowledge of use of DME;Decreased knowledge of precautions;Pain       PT Treatment Interventions Gait training;DME instruction;Stair training;Functional mobility training;Therapeutic activities;Therapeutic exercise;Balance training;Patient/family education    PT Goals (Current goals can be found in the Care Plan section)  Acute Rehab PT Goals Patient Stated Goal: get back to walking independently PT Goal Formulation: With patient Time For Goal Achievement: 04/25/21 Potential to Achieve Goals: Good    Frequency 7X/week   Barriers to discharge        Co-evaluation               AM-PAC PT "6 Clicks" Mobility  Outcome Measure Help needed turning from your back to your side while in a flat bed without using bedrails?: A Little Help needed moving from lying on your back to sitting on the side  of a flat bed without using bedrails?: A Little Help needed moving to and from a bed to a chair (including a wheelchair)?: A Little Help needed standing up from a chair using your arms (e.g., wheelchair or bedside chair)?: A Little Help needed to walk in hospital room?: A Little Help needed climbing 3-5 steps with a railing? : A Little 6 Click Score: 18    End of Session Equipment Utilized During Treatment: Gait belt Activity Tolerance: Patient tolerated treatment well   Nurse Communication: Mobility status PT Visit Diagnosis: Muscle weakness (generalized) (M62.81);Difficulty in walking, not elsewhere classified (R26.2)    Time: 1720-1739 PT Time Calculation (min) (ACUTE ONLY): 19 min   Charges:   PT Evaluation $PT Eval Low Complexity: 1 Low          Verner Mould, DPT Acute Rehabilitation Services Office [phone] Pager [phone]   Jacques Navy 04/18/2021, 5:52 PM

## 2021-04-18 NOTE — Care Plan (Signed)
Ortho Bundle Case Management Note  Patient Details  Name: Sherry Lamb MRN: [id number] Date of Birth: Oct 07, 1934  L TKA on 04-18-21 DCP:  Home with husband.   DME:  No needs, has a RW and 3-in-1 PT:  [facility] on 04-21-21                   DME Arranged:  N/A DME Agency:  NA  HH Arranged:  NA HH Agency:  NA  Additional Comments: Please contact me with any questions of if this plan should need to change.  Marianne Sofia, RN,CCM [facility]  [phone] 04/18/2021, 9:48 AM

## 2021-04-19 DIAGNOSIS — Z79899 Other long term (current) drug therapy: Secondary | ICD-10-CM | POA: Diagnosis not present

## 2021-04-19 DIAGNOSIS — M1712 Unilateral primary osteoarthritis, left knee: Secondary | ICD-10-CM | POA: Diagnosis not present

## 2021-04-19 LAB — CBC
HCT: 34.2 % — ABNORMAL LOW (ref 36.0–46.0)
Hemoglobin: 11.5 g/dL — ABNORMAL LOW (ref 12.0–15.0)
MCH: 32.1 pg (ref 26.0–34.0)
MCHC: 33.6 g/dL (ref 30.0–36.0)
MCV: 95.5 fL (ref 80.0–100.0)
Platelets: 199 10*3/uL (ref 150–400)
RBC: 3.58 MIL/uL — ABNORMAL LOW (ref 3.87–5.11)
RDW: 13.1 % (ref 11.5–15.5)
WBC: 11.1 10*3/uL — ABNORMAL HIGH (ref 4.0–10.5)
nRBC: 0 % (ref 0.0–0.2)

## 2021-04-19 LAB — BASIC METABOLIC PANEL
Anion gap: 5 (ref 5–15)
BUN: 23 mg/dL (ref 8–23)
CO2: 27 mmol/L (ref 22–32)
Calcium: 8.8 mg/dL — ABNORMAL LOW (ref 8.9–10.3)
Chloride: 107 mmol/L (ref 98–111)
Creatinine, Ser: 0.49 mg/dL (ref 0.44–1.00)
GFR, Estimated: >60 mL/min (ref >60)
Glucose, Bld: 131 mg/dL — ABNORMAL HIGH (ref 70–99)
Potassium: 3.8 mmol/L (ref 3.5–5.1)
Sodium: 139 mmol/L (ref 135–145)

## 2021-04-19 MED ORDER — ASPIRIN 325 MG PO TBEC: 325.0000 mg | DELAYED_RELEASE_TABLET | Freq: Two times a day (BID) | ORAL | 0 refills | Status: AC

## 2021-04-19 MED ORDER — METHOCARBAMOL 500 MG PO TABS: 500.0000 mg | ORAL_TABLET | Freq: Four times a day (QID) | ORAL | 0 refills | Status: DC | PRN

## 2021-04-19 MED ORDER — TRAMADOL HCL 50 MG PO TABS: 50.0000 mg | ORAL_TABLET | Freq: Four times a day (QID) | ORAL | 0 refills | Status: DC | PRN

## 2021-04-19 MED ORDER — OXYCODONE HCL 5 MG PO TABS: 5.0000 mg | ORAL_TABLET | Freq: Four times a day (QID) | ORAL | 0 refills | Status: DC | PRN

## 2021-04-19 NOTE — Progress Notes (Signed)
   Subjective: 1 Day Post-Op Procedure(s) (LRB): TOTAL KNEE ARTHROPLASTY (Left) Patient reports pain as mild.   Patient seen in rounds by Dr. Wynelle Link. Patient is well, and has had no acute complaints or problems other than pain in the left knee. Denies chest pain, SOB, or calf pain. Foley catheter removed this AM. No issues overnight. We will continue therapy today, ambulated 30' yesterday.   Objective: Vital signs in last 24 hours: Temp:  [96.1 F (35.6 C)-97.8 F (36.6 C)] 97.8 F (36.6 C) (12/13 0521) Pulse Rate:  [63-100] 82 (12/13 0521) Resp:  [11-22] 16 (12/13 0521) BP: (110-137)/(60-80) 120/77 (12/13 0521) SpO2:  [95 %-100 %] 99 % (12/13 0521) Weight:  [63 kg-63.4 kg] 63 kg (12/12 1420)  Intake/Output from previous day:  Intake/Output Summary (Last 24 hours) at 04/19/2021 0744 Last data filed at 04/19/2021 0500 Gross per 24 hour  Intake 2299.28 ml  Output 2000 ml  Net 299.28 ml     Intake/Output this shift: No intake/output data recorded.  Labs: Recent Labs    04/19/21 0337  HGB 11.5*   Recent Labs    04/19/21 0337  WBC 11.1*  RBC 3.58*  HCT 34.2*  PLT 199   Recent Labs    04/19/21 0337  NA 139  K 3.8  CL 107  CO2 27  BUN 23  CREATININE 0.49  GLUCOSE 131*  CALCIUM 8.8*   No results for input(s): LABPT, INR in the last 72 hours.  Exam: General - Patient is Alert and Oriented Extremity - Neurologically intact Neurovascular intact Sensation intact distally Dorsiflexion/Plantar flexion intact Dressing - dressing C/D/I Motor Function - intact, moving foot and toes well on exam.   Past Medical History:  Diagnosis Date   Arthritis    Frequent headaches    GERD (gastroesophageal reflux disease)    Hyperlipidemia    Pneumonia    PONV (postoperative nausea and vomiting)     Assessment/Plan: 1 Day Post-Op Procedure(s) (LRB): TOTAL KNEE ARTHROPLASTY (Left) Principal Problem:   Primary osteoarthritis of left knee  Estimated body mass  index is 24.01 kg/m as calculated from the following:   Height as of this encounter: 5' 3.78" (1.62 m).   Weight as of this encounter: 63 kg. Advance diet Up with therapy D/C IV fluids   Patient's anticipated LOS is less than 2 midnights, meeting these requirements: - Lives within 1 hour of care - Has a competent adult at home to recover with post-op recover - NO history of  - Chronic pain requiring opioids  - Diabetes  - Coronary Artery Disease  - Heart failure  - Heart attack  - Stroke  - DVT/VTE  - Cardiac arrhythmia  - Respiratory Failure/COPD  - Renal failure  - Anemia  - Advanced Liver disease  DVT Prophylaxis - Aspirin Weight bearing as tolerated. Continue therapy.  Plan is to go Home after hospital stay. Plan for discharge later today if progresses with therapy and meeting her goals. Scheduled for OPP[facility] at [facility]) Follow-up in the office in 2 weeks  The [place] was reviewed today prior to any opioid medications being prescribed to this patient.   Theresa Duty, PA-C Orthopedic Surgery [phone] 04/19/2021, 7:44 AM

## 2021-04-19 NOTE — Progress Notes (Signed)
Physical Therapy Treatment Patient Details Name: Sherry Lamb MRN: [id number] DOB: 09-01-1934 Today's Date: 04/19/2021   History of Present Illness Patient is 86 y.o. female s/p Lt TKA on 04/18/21 with PMH significant for OA, GERD, HLD, bil THA, Rt TKA in 2021.    PT Comments    POD # 1 am session General Comments: AxO x 3 very knowledgable.  This was her forth total joint replacement. Assisted with amb in hallway a functional distance, practiced stairs, Then returned to room to perform some TE's following HEP handout.  Instructed on proper tech, freq as well as use of ICE.   Addressed all mobility questions, discussed appropriate activity, educated on use of ICE.   Pt ready for D/C to home after one session.  Pt has to void  Recommendations for follow up therapy are one component of a multi-disciplinary discharge planning process, led by the attending physician.  Recommendations may be updated based on patient status, additional functional criteria and insurance authorization.  Follow Up Recommendations  Outpatient PT     Assistance Recommended at Discharge Frequent or constant Supervision/Assistance  Equipment Recommendations  None recommended by PT    Recommendations for Other Services       Precautions / Restrictions Precautions Precautions: Fall Precaution Comments: instructed no pillow left under knee Restrictions Weight Bearing Restrictions: No LLE Weight Bearing: Weight bearing as tolerated     Mobility  Bed Mobility Overal bed mobility: Needs Assistance Bed Mobility: Supine to Sit     Supine to sit: Supervision     General bed mobility comments: demonstrated and instructed how to use a belt to self assist le    Transfers Overall transfer level: Needs assistance Equipment used: Rolling walker (2 wheels) Transfers: Sit to/from Stand Sit to Stand: Supervision;Min guard           General transfer comment: < 25% cues for hand placement, pt required  bil UE use for power up.    Ambulation/Gait Ambulation/Gait assistance: Supervision;Min guard Gait Distance (Feet): 75 Feet Assistive device: Rolling walker (2 wheels) Gait Pattern/deviations: Step-to pattern;Decreased stride length;Decreased weight shift to left;Decreased stance time - left Gait velocity: decreased     General Gait Details: 25% VC's on safety with turns and proper upright posture.  Tolerated distance well.   Stairs Stairs: Yes Stairs assistance: Supervision;Min guard Stair Management: Two rails;Forwards Number of Stairs: 2 General stair comments: 25% VC's on proper sequencing.  Performed well.   Wheelchair Mobility    Modified Rankin (Stroke Patients Only)       Balance                                            Cognition Arousal/Alertness: Awake/alert Behavior During Therapy: WFL for tasks assessed/performed Overall Cognitive Status: Within Functional Limits for tasks assessed                                 General Comments: AxO x 3 very knowledgable.  This was her forth total joint replacement.        Exercises  Total Knee Replacement TE's following HEP handout 10 reps B LE ankle pumps 05 reps towel squeezes 05 reps knee presses 05 reps heel slides  05 reps SAQ's 05 reps SLR's 05 reps ABD Educated on use of gait belt to assist with  TE's Followed by ICE     General Comments        Pertinent Vitals/Pain Pain Assessment: 0-10 Pain Score: 5  Pain Location: Lt knee Pain Descriptors / Indicators: Aching;Discomfort;Sore;Operative site guarding Pain Intervention(s): Monitored during session;Premedicated before session;Repositioned;Ice applied    Home Living                          Prior Function            PT Goals (current goals can now be found in the care plan section) Progress towards PT goals: Progressing toward goals    Frequency    7X/week      PT Plan Current plan  remains appropriate    Co-evaluation              AM-PAC PT "6 Clicks" Mobility   Outcome Measure  Help needed turning from your back to your side while in a flat bed without using bedrails?: None Help needed moving from lying on your back to sitting on the side of a flat bed without using bedrails?: A Little Help needed moving to and from a bed to a chair (including a wheelchair)?: A Little Help needed standing up from a chair using your arms (e.g., wheelchair or bedside chair)?: A Little Help needed to walk in hospital room?: A Little Help needed climbing 3-5 steps with a railing? : A Little 6 Click Score: 19    End of Session Equipment Utilized During Treatment: Gait belt Activity Tolerance: Patient tolerated treatment well Patient left: in chair;with call bell/phone within reach Nurse Communication: Mobility status PT Visit Diagnosis: Muscle weakness (generalized) (M62.81);Difficulty in walking, not elsewhere classified (R26.2)     Time: 4163-8453 PT Time Calculation (min) (ACUTE ONLY): 38 min  Charges:  $Gait Training: 8-22 mins $Therapeutic Exercise: 8-22 mins $Therapeutic Activity: 8-22 mins                     Rica Koyanagi  PTA Acute  Rehabilitation Services Pager      [phone] Office      [phone]

## 2021-04-19 NOTE — TOC Transition Note (Signed)
Transition of Care [facility]) - CM/SW Discharge Note   Patient Details  Name: Sherry Lamb MRN: [id number] Date of Birth: July 29, 1934  [facility] CM/SW Contact:  Lennart Pall, LCSW Phone Number: 04/19/2021, 10:11 AM   Clinical Narrative:    Met briefly with pt and confirming she has all needed DME at home. Plan for OPPT at [facility].  No TOC needs.   Final next level of care: OP Rehab Barriers to Discharge: No Barriers Identified   Patient Goals and CMS Choice Patient states their goals for this hospitalization and ongoing recovery are:: return home      Discharge Placement                       Discharge Plan and Services                DME Arranged: N/A DME Agency: NA       HH Arranged: NA HH Agency: NA        Social Determinants of Health (SDOH) Interventions     Readmission Risk Interventions No flowsheet data found.

## 2021-04-20 ENCOUNTER — Encounter (HOSPITAL_COMMUNITY): Payer: Self-pay | Admitting: Orthopedic Surgery

## 2021-04-21 ENCOUNTER — Other Ambulatory Visit: Payer: Self-pay

## 2021-04-21 ENCOUNTER — Ambulatory Visit: Payer: Medicare Other | Attending: Orthopedic Surgery | Admitting: Physical Therapy

## 2021-04-21 DIAGNOSIS — R269 Unspecified abnormalities of gait and mobility: Secondary | ICD-10-CM | POA: Insufficient documentation

## 2021-04-21 DIAGNOSIS — M6281 Muscle weakness (generalized): Secondary | ICD-10-CM | POA: Insufficient documentation

## 2021-04-21 DIAGNOSIS — R262 Difficulty in walking, not elsewhere classified: Secondary | ICD-10-CM | POA: Insufficient documentation

## 2021-04-21 DIAGNOSIS — Z96652 Presence of left artificial knee joint: Secondary | ICD-10-CM | POA: Diagnosis not present

## 2021-04-21 DIAGNOSIS — M25562 Pain in left knee: Secondary | ICD-10-CM | POA: Insufficient documentation

## 2021-04-21 NOTE — Therapy (Signed)
[place] [facility] [address] [place], [place], [address] Phone: [phone]   Fax:  [phone]  Physical Therapy Evaluation  Patient Details  Name: Sherry Lamb MRN: [id number] Date of Birth: 1935-03-26 Referring Provider (PT): Gaynelle Arabian   Encounter Date: 04/21/2021   PT End of Session - 04/21/21 1042     Visit Number 1    Number of Visits 24    Date for PT Re-Evaluation 07/14/21    Authorization Type [facility] [employer]    Progress Note Due on Visit 10    PT Start Time 0800    PT Stop Time 0845    PT Time Calculation (min) 45 min    Equipment Utilized During Treatment Gait belt    Activity Tolerance Patient tolerated treatment well    Behavior During Therapy WFL for tasks assessed/performed             Past Medical History:  Diagnosis Date   Arthritis    Frequent headaches    GERD (gastroesophageal reflux disease)    Hyperlipidemia    Pneumonia    PONV (postoperative nausea and vomiting)     Past Surgical History:  Procedure Laterality Date   ABDOMINAL HYSTERECTOMY     ANKLE SURGERY     EYE SURGERY     Left hip replacement Left 2013   right hip replacement Right 2000   TONSILLECTOMY AND ADENOIDECTOMY  1943   TOTAL KNEE ARTHROPLASTY Right 02/23/2020   Procedure: TOTAL KNEE ARTHROPLASTY;  Surgeon: Gaynelle Arabian, MD;  Location: [facility];  Service: Orthopedics;  Laterality: Right;  36min   TOTAL KNEE ARTHROPLASTY Left 04/18/2021   Procedure: TOTAL KNEE ARTHROPLASTY;  Surgeon: Gaynelle Arabian, MD;  Location: [facility];  Service: Orthopedics;  Laterality: Left;   WISDOM TOOTH EXTRACTION      There were no vitals filed for this visit.    Subjective Assessment - 04/21/21 0801     Subjective Pt reports surgery on 02/16/21 on the Left knee. Reports knee pain of 4-5/10 and complains of mostly soreness. Pt has had history of bilateral THA and right TKA in addition to current TKA procedure. Pt reports she was provided with  HEP but has not been able to copmlete to this point. Pt is taking 1 tramadol in the AM and at night and is taking asprin.    Patient is accompained by: Family member    Limitations Standing;Walking;House hold activities    How long can you stand comfortably? 5- 8 min    How long can you walk comfortably? 5-8 min    Currently in Pain? Yes    Pain Score 4     Pain Location Knee    Pain Orientation Left    Pain Descriptors / Indicators Sore    Pain Type Surgical pain    Pain Onset In the past 7 days    Pain Frequency Constant    Aggravating Factors  none at this time    Pain Relieving Factors pain meds, rest                [facility] PT Assessment - 04/21/21 0001       Assessment   Medical Diagnosis Left TKA    Referring Provider (PT) Pilar Plate Aluisio    Onset Date/Surgical Date 04/18/21    Hand Dominance Right    Next MD Visit 05/05/21    Prior Therapy For other knee      Restrictions   Weight Bearing Restrictions  No      Balance Screen   Has the patient fallen in the past 6 months No    Has the patient had a decrease in activity level because of a fear of falling?  No    Is the patient reluctant to leave their home because of a fear of falling?  No      Home Environment   Living Environment Private residence    Living Arrangements Spouse/significant other    Type of [place] to enter    Entrance Stairs-Number of Steps [place] One level    [place] - 4 wheels;Cane - single point      Observation/Other Assessments   Focus on Therapeutic Outcomes (FOTO)  49      ROM / Strength   AROM / PROM / Strength AROM;Strength      AROM   Overall AROM Comments 8-60      Standardized Balance Assessment   Standardized Balance Assessment 10 meter walk test;Five Times Sit to Stand    Five times sit to stand comments  39.1s   UE use on table supporting surface   10 Meter Walk .32m/s   rollling walker                        Objective measurements completed on examination: See above findings.      Exercise/Activity Sets/Reps/Time/ Resistance Assistance Charge type Comments  Quad sets  10 x 5 sec   therex -cues for hold time  Supine heel slides  10 x 5 sec   therex Slide board and towel under heel to decrease coefficient of friction for improved mobility   SLR  X 10   therex Cues for maintenance of knee extension   Seated knee flexion  10 x 5 sec   Therex  Towel under heel   Heel raises standing  10 x  B UE  Therex  Cues for maintaining knee extension                                       Treatment Provided this session   Pt educated throughout session about proper posture and technique with exercises. Improved exercise technique, movement at target joints, use of target muscles after min to mod verbal, visual, tactile cues.  Note: Portions of this document were prepared using Dragon voice recognition software and although reviewed may contain unintentional dictation errors in syntax, grammar, or spelling.            PT Education - 04/21/21 0808     Education Details POC    Person(s) Educated Patient;Spouse    Methods Explanation    Comprehension Verbalized understanding              PT Short Term Goals - 04/21/21 1047       PT SHORT TERM GOAL #1   Title Patient will be independent in home exercise program to improve strength/mobility for better functional independence with ADLs.    Baseline pt has initial HEP from hospital but not been able to complete to this point    Time 4    Period Weeks    Status New    Target Date 05/19/21      PT SHORT TERM GOAL #2   Title  Patient will increase FOTO score to equal to or greater than  54   to demonstrate statistically significant improvement in mobility and quality of life.    Baseline FOTO 49 at IE,    Time 4    Period Weeks    Status New               PT Long Term Goals - 04/21/21 1207        PT LONG TERM GOAL #1   Title Pt will improve Left knee ROM to 0-120 in order to facilatate smooth gait pattern and stair navigation    Baseline 9-60 on IE    Time 12    Period Weeks    Status New    Target Date 07/14/21      PT LONG TERM GOAL #2   Title Patient (> 64 years old) will complete five times sit to stand test in < 15 seconds indicating an increased LE strength and improved balance.    Baseline 39.1    Time 12    Period Weeks    Status New    Target Date 07/14/21      PT LONG TERM GOAL #3   Title Patient will increase six minute walk test distance to >1000 for progression to community ambulator and improve gait ability    Baseline unable to test at eval, ambulating with walker    Time 12    Period Weeks    Status New    Target Date 07/14/21      PT LONG TERM GOAL #4   Title Patient will increase 10 meter walk test to >1.60m/s as to improve gait speed for better community ambulation and to reduce fall risk.    Baseline .43m/s 12/15 with RW    Time 12    Period Weeks    Status New    Target Date 07/14/21      PT LONG TERM GOAL #5   Title Patient will be require no assist with ascend/descend 3 steps with step over gait pattern to facilitate safe and effecient entry to home    Baseline step to gait pattern at home    Time 10    Period Weeks    Status New    Target Date 06/30/21                    Plan - 04/21/21 0810     Clinical Impression Statement Patient is 86 year old female who reports her physical therapy following left total knee arthroplasty performed on 04/18/2021.  Patient reports with expected left knee pain and soreness and limited mobility as well as strength and range of motion in the left knee.  Patient demonstrates impaired lower extremity strength evidence and 5 times sit to stand as well as impaired mobility patient is utilizing walker for mobility also demonstrates impaired gait speed with walker as demonstrated by 10 m walk test.   Patient currently has activity limitations in her ability to complete ADLs as well as several of their activities around her home.  Patient also has participation restrictions and her ability to anticipate in community events.  Patient will benefit from skilled physical therapy intervention in order to improve lower extremity strength, endurance, ambulatory capacity, in order to restore her function.    Personal Factors and Comorbidities Age    Examination-Activity Limitations Bed Mobility;Bend;Lift;Reach Overhead;Squat;Stairs;Stand    Examination-Participation Restrictions Driving;Community Activity;Meal Prep    Stability/Clinical Decision Making Stable/Uncomplicated    Clinical  Decision Making Low    Rehab Potential Good    PT Frequency 2x / week    PT Duration 12 weeks    PT Treatment/Interventions ADLs/Self Care Home Management;Aquatic Therapy;Electrical Stimulation;Moist Heat;DME Instruction;Gait training;Stair training;Functional mobility training;Therapeutic activities;Therapeutic exercise;Balance training;Neuromuscular re-education;Manual techniques;Manual lymph drainage;Compression bandaging;Passive range of motion;Joint Manipulations;Energy conservation;Dry needling;Taping;Vasopneumatic Device    PT Next Visit Plan Progress HEP, manual stretching    PT Home Exercise Plan handount provided, heel slides, quad sets, SLR, heel raises,    Consulted and Agree with Plan of Care Patient;Family member/caregiver             Patient will benefit from skilled therapeutic intervention in order to improve the following deficits and impairments:  Abnormal gait, Decreased activity tolerance, Decreased endurance, Decreased range of motion, Decreased strength, Improper body mechanics, Decreased balance, Difficulty walking, Decreased mobility  Visit Diagnosis: Abnormality of gait and mobility  Acute pain of left knee  History of knee replacement, total, left     Problem List Patient Active  Problem List   Diagnosis Date Noted   Primary osteoarthritis of left knee 04/18/2021   Weight loss 10/25/2020   Osteoporosis 07/07/2020   Primary osteoarthritis of right knee 02/23/2020   Lumbar radiculopathy 09/27/2018   Osteoarthritis of knee 08/26/2018   Degeneration of lumbar intervertebral disc 08/20/2018   Bilateral chronic knee pain 06/06/2018   Hyperlipidemia    GERD (gastroesophageal reflux disease)    Frequent headaches    Arthritis     Particia Lather, PT 04/21/2021, 12:12 PM  [facility] [place], [place], [address] Phone: [phone]   Fax:  [phone]  Name: Sherry Lamb MRN: [id number] Date of Birth: [birth year]

## 2021-04-25 NOTE — Discharge Summary (Signed)
Physician Discharge Summary   Patient ID: Sherry Lamb MRN: [id number] DOB/AGE: 12-29-1934 86 y.o.  Admit date: 04/18/2021 Discharge date: 04/19/2021  Primary Diagnosis: Osteoarthritis, left knee   Admission Diagnoses:  Past Medical History:  Diagnosis Date   Arthritis    Frequent headaches    GERD (gastroesophageal reflux disease)    Hyperlipidemia    Pneumonia    PONV (postoperative nausea and vomiting)    Discharge Diagnoses:   Principal Problem:   Primary osteoarthritis of left knee  Estimated body mass index is 24.01 kg/m as calculated from the following:   Height as of this encounter: 5' 3.78" (1.62 m).   Weight as of this encounter: 63 kg.  Procedure:  Procedure(s) (LRB): TOTAL KNEE ARTHROPLASTY (Left)   Consults: None  HPI: Sherry Lamb is a 86 y.o. year old female with end stage OA of her left knee with progressively worsening pain and dysfunction. She has constant pain, with activity and at rest and significant functional deficits with difficulties even with ADLs. She has had extensive non-op management including analgesics, injections of cortisone and viscosupplements, and home exercise program, but remains in significant pain with significant dysfunction. Radiographs show bone on bone arthritis medial and patellofemoral. She presents now for left Total Knee Arthroplasty.    Laboratory Data: Admission on 04/18/2021, Discharged on 04/19/2021  Component Date Value Ref Range Status   WBC 04/19/2021 11.1 (H)  4.0 - 10.5 K/uL Final   RBC 04/19/2021 3.58 (L)  3.87 - 5.11 MIL/uL Final   Hemoglobin 04/19/2021 11.5 (L)  12.0 - 15.0 g/dL Final   HCT 04/19/2021 34.2 (L)  36.0 - 46.0 % Final   MCV 04/19/2021 95.5  80.0 - 100.0 fL Final   MCH 04/19/2021 32.1  26.0 - 34.0 pg Final   MCHC 04/19/2021 33.6  30.0 - 36.0 g/dL Final   RDW 04/19/2021 13.1  11.5 - 15.5 % Final   Platelets 04/19/2021 199  150 - 400 K/uL Final   nRBC 04/19/2021 0.0  0.0 - 0.2 % Final    Performed at [facility], [place] [address], [place], [place] [address]   Sodium 04/19/2021 139  135 - 145 mmol/L Final   Potassium 04/19/2021 3.8  3.5 - 5.1 mmol/L Final   Chloride 04/19/2021 107  98 - 111 mmol/L Final   CO2 04/19/2021 27  22 - 32 mmol/L Final   Glucose, Bld 04/19/2021 131 (H)  70 - 99 mg/dL Final   Glucose reference range applies only to samples taken after fasting for at least 8 hours.   BUN 04/19/2021 23  8 - 23 mg/dL Final   Creatinine, Ser 04/19/2021 0.49  0.44 - 1.00 mg/dL Final   Calcium 04/19/2021 8.8 (L)  8.9 - 10.3 mg/dL Final   GFR, Estimated 04/19/2021 >60  >60 mL/min Final   Comment: (NOTE) Calculated using the CKD-EPI Creatinine Equation (2021)    Anion gap 04/19/2021 5  5 - 15 Final   Performed at [facility], [place] [address], [place], [place] [address]  Orders Only on 04/14/2021  Component Date Value Ref Range Status   SARS Coronavirus 2 04/14/2021 RESULT: NEGATIVE   Final   Comment: RESULT: NEGATIVESARS-CoV-2 INTERPRETATION:A NEGATIVE  test result means that SARS-CoV-2 RNA was not present in the specimen above the limit of detection of this test. This does not preclude a possible SARS-CoV-2 infection and should not be used as the  sole basis for patient management decisions. Negative results must be combined  with clinical observations, patient history, and epidemiological information. Optimum specimen types and timing for peak viral levels during infections caused by SARS-CoV-2  have not been determined. Collection of multiple specimens or types of specimens may be necessary to detect virus. Improper specimen collection and handling, sequence variability under primers/probes, or organism present below the limit of detection may  lead to false negative results. Positive and negative predictive values of testing are highly dependent on prevalence. False negative test results are more likely when prevalence of disease is  high.The expected result is NEGATIVE.Fact S                          heet for  Healthcare Providers: LocalChronicle.no Sheet for Patients: SalonLookup.es Reference Range - Negative   Hospital Outpatient Visit on 04/05/2021  Component Date Value Ref Range Status   MRSA, PCR 04/05/2021 NEGATIVE  NEGATIVE Final   Staphylococcus aureus 04/05/2021 NEGATIVE  NEGATIVE Final   Comment: (NOTE) The Xpert SA Assay (FDA approved for NASAL specimens in patients 48 years of age and older), is one component of a comprehensive surveillance program. It is not intended to diagnose infection nor to guide or monitor treatment. Performed at [facility], [place] [address], [place], [place] [address]    WBC 04/05/2021 5.9  4.0 - 10.5 K/uL Final   RBC 04/05/2021 4.41  3.87 - 5.11 MIL/uL Final   Hemoglobin 04/05/2021 14.3  12.0 - 15.0 g/dL Final   HCT 04/05/2021 42.4  36.0 - 46.0 % Final   MCV 04/05/2021 96.1  80.0 - 100.0 fL Final   MCH 04/05/2021 32.4  26.0 - 34.0 pg Final   MCHC 04/05/2021 33.7  30.0 - 36.0 g/dL Final   RDW 04/05/2021 13.1  11.5 - 15.5 % Final   Platelets 04/05/2021 227  150 - 400 K/uL Final   nRBC 04/05/2021 0.0  0.0 - 0.2 % Final   Performed at [facility], [place] [address], [place], [place] [address]   Sodium 04/05/2021 139  135 - 145 mmol/L Final   Potassium 04/05/2021 4.5  3.5 - 5.1 mmol/L Final   Chloride 04/05/2021 104  98 - 111 mmol/L Final   CO2 04/05/2021 28  22 - 32 mmol/L Final   Glucose, Bld 04/05/2021 90  70 - 99 mg/dL Final   Glucose reference range applies only to samples taken after fasting for at least 8 hours.   BUN 04/05/2021 31 (H)  8 - 23 mg/dL Final   Creatinine, Ser 04/05/2021 0.53  0.44 - 1.00 mg/dL Final   Calcium 04/05/2021 9.1  8.9 - 10.3 mg/dL Final   Total Protein 04/05/2021 7.6  6.5 - 8.1 g/dL Final   Albumin 04/05/2021 4.0  3.5 - 5.0 g/dL Final   AST  04/05/2021 27  15 - 41 U/L Final   ALT 04/05/2021 19  0 - 44 U/L Final   Alkaline Phosphatase 04/05/2021 58  38 - 126 U/L Final   Total Bilirubin 04/05/2021 0.6  0.3 - 1.2 mg/dL Final   GFR, Estimated 04/05/2021 >60  >60 mL/min Final   Comment: (NOTE) Calculated using the CKD-EPI Creatinine Equation (2021)    Anion gap 04/05/2021 7  5 - 15 Final   Performed at [facility], [place] [address] [place], [place] [address]   ABO/RH(D) 04/05/2021 O POS   Final   Antibody Screen 04/05/2021 NEG   Final   Sample Expiration 04/05/2021 04/19/2021,2359  Final   Extend sample reason 04/05/2021    Final                   Value:NO TRANSFUSIONS OR PREGNANCY IN THE PAST 3 MONTHS Performed at [place] [address], [place], [place] [address]    Prothrombin Time 04/05/2021 12.7  11.4 - 15.2 seconds Final   INR 04/05/2021 1.0  0.8 - 1.2 Final   Comment: (NOTE) INR goal varies based on device and disease states. Performed at [facility], [place] [address] [place], [place] [address]      X-Rays:No results found.  EKG: Orders placed or performed in visit on 02/16/20   EKG 12-Lead     Hospital Course: Racquel Arkin is a 86 y.o. who was admitted to [facility]. They were brought to the operating room on 04/18/2021 and underwent Procedure(s): TOTAL KNEE ARTHROPLASTY.  Patient tolerated the procedure well and was later transferred to the recovery room and then to the orthopaedic floor for postoperative care. They were given PO and IV analgesics for pain control following their surgery. They were given 24 hours of postoperative antibiotics of  Anti-infectives (From admission, onward)    Start     Dose/Rate Route Frequency Ordered Stop   04/18/21 1700  ceFAZolin (ANCEF) IVPB 2g/100 mL premix        2 g 200 mL/hr over 30 Minutes Intravenous Every 6 hours 04/18/21 1429 04/19/21 0030   04/18/21 0900  ceFAZolin (ANCEF) IVPB 2g/100 mL  premix        2 g 200 mL/hr over 30 Minutes Intravenous On call to O.R. 04/18/21 7628 04/18/21 1132      and started on DVT prophylaxis in the form of Aspirin.   PT and OT were ordered for total joint protocol. Discharge planning consulted to help with postop disposition and equipment needs.  Patient had a good night on the evening of surgery. They started to get up OOB with therapy on POD #0. Pt was seen during rounds and was ready to go home pending progress with therapy. She worked with therapy on POD #1 and was meeting her goals. Pt was discharged to home later that day in stable condition.  Diet: Regular diet Activity: WBAT Follow-up: in 2 weeks Disposition: Home with OPPT Discharged Condition: stable   Discharge Instructions     Call MD / Call 911   Complete by: As directed    If you experience chest pain or shortness of breath, CALL 911 and be transported to the hospital emergency room.  If you develope a fever above 101 F, pus (white drainage) or increased drainage or redness at the wound, or calf pain, call your surgeon's office.   Change dressing   Complete by: As directed    You may remove the bulky bandage (ACE wrap and gauze) two days after surgery. You will have an adhesive waterproof bandage underneath. Leave this in place until your first follow-up appointment.   Constipation Prevention   Complete by: As directed    Drink plenty of fluids.  Prune juice may be helpful.  You may use a stool softener, such as Colace (over the counter) 100 mg twice a day.  Use MiraLax (over the counter) for constipation as needed.   Diet - low sodium heart healthy   Complete by: As directed    Do not put a pillow under the knee. Place it under the heel.   Complete by: As directed  Driving restrictions   Complete by: As directed    No driving for two weeks   Post-operative opioid taper instructions:   Complete by: As directed    POST-OPERATIVE OPIOID TAPER INSTRUCTIONS: It is important  to wean off of your opioid medication as soon as possible. If you do not need pain medication after your surgery it is ok to stop day one. Opioids include: Codeine, Hydrocodone(Norco, Vicodin), Oxycodone(Percocet, oxycontin) and hydromorphone amongst others.  Long term and even short term use of opiods can cause: Increased pain response Dependence Constipation Depression Respiratory depression And more.  Withdrawal symptoms can include Flu like symptoms Nausea, vomiting And more Techniques to manage these symptoms Hydrate well Eat regular healthy meals Stay active Use relaxation techniques(deep breathing, meditating, yoga) Do Not substitute Alcohol to help with tapering If you have been on opioids for less than two weeks and do not have pain than it is ok to stop all together.  Plan to wean off of opioids This plan should start within one week post op of your joint replacement. Maintain the same interval or time between taking each dose and first decrease the dose.  Cut the total daily intake of opioids by one tablet each day Next start to increase the time between doses. The last dose that should be eliminated is the evening dose.      TED hose   Complete by: As directed    Use stockings (TED hose) for three weeks on both leg(s).  You may remove them at night for sleeping.   Weight bearing as tolerated   Complete by: As directed       Allergies as of 04/19/2021       Reactions   Penicillins Other (See Comments)   Stomach tightness. Tolerated Cephalosporin Date: 02/24/20. Tolerated Cephalosporin Date: 04/19/21.   Prednisone Other (See Comments)   GI intolerance        Medication List     STOP taking these medications    diclofenac Sodium 1 % Gel Commonly known as: VOLTAREN   naproxen sodium 220 MG tablet Commonly known as: ALEVE       TAKE these medications    aspirin 325 MG EC tablet Take 1 tablet (325 mg total) by mouth 2 (two) times daily for 20  days. Then take one 81 mg aspirin once a day for three weeks. Then resume as taking preoperatively (One tablet by mouth two times a week). What changed:  medication strength how much to take when to take this additional instructions   Biotin 5000 MCG Tabs Take 10,000 mcg by mouth daily.   methocarbamol 500 MG tablet Commonly known as: ROBAXIN Take 1 tablet (500 mg total) by mouth every 6 (six) hours as needed for muscle spasms.   oxyCODONE 5 MG immediate release tablet Commonly known as: Oxy IR/ROXICODONE Take 1-2 tablets (5-10 mg total) by mouth every 6 (six) hours as needed for severe pain.   REFRESH OP Place 1 drop into both eyes 2 (two) times daily.   rosuvastatin 5 MG tablet Commonly known as: CRESTOR TAKE 1 TABLET DAILY   traMADol 50 MG tablet Commonly known as: ULTRAM Take 1-2 tablets (50-100 mg total) by mouth every 6 (six) hours as needed for moderate pain.   Turmeric 500 MG Caps Take 500 mg by mouth daily.   Vitamin D 50 MCG (2000 UT) Caps Take 2,000 Units by mouth 2 (two) times a week.  Discharge Care Instructions  (From admission, onward)           Start     Ordered   04/19/21 0000  Weight bearing as tolerated        04/19/21 0747   04/19/21 0000  Change dressing       Comments: You may remove the bulky bandage (ACE wrap and gauze) two days after surgery. You will have an adhesive waterproof bandage underneath. Leave this in place until your first follow-up appointment.   04/19/21 0747            Follow-up Information     Gaynelle Arabian, MD. Go on 05/05/2021.   Specialty: Orthopedic Surgery Why: You are scheduled for a follow up appointment on 05-05-21 at 9:15 am. Contact information: [address] [place] [address] [phone]                 Signed: Theresa Duty, PA-C Orthopedic Surgery 04/25/2021, 8:14 AM

## 2021-04-26 ENCOUNTER — Other Ambulatory Visit: Payer: Self-pay

## 2021-04-26 ENCOUNTER — Ambulatory Visit: Payer: Medicare Other | Admitting: Physical Therapy

## 2021-04-26 DIAGNOSIS — Z96652 Presence of left artificial knee joint: Secondary | ICD-10-CM

## 2021-04-26 DIAGNOSIS — M6281 Muscle weakness (generalized): Secondary | ICD-10-CM | POA: Diagnosis not present

## 2021-04-26 DIAGNOSIS — R269 Unspecified abnormalities of gait and mobility: Secondary | ICD-10-CM

## 2021-04-26 DIAGNOSIS — M25562 Pain in left knee: Secondary | ICD-10-CM

## 2021-04-26 DIAGNOSIS — R262 Difficulty in walking, not elsewhere classified: Secondary | ICD-10-CM | POA: Diagnosis not present

## 2021-04-26 NOTE — Therapy (Signed)
[place] MAIN [facility] SERVICES [address] [place], [place], [address] Phone: [phone]   Fax:  [phone]  Physical Therapy Treatment  Patient Details  Name: Brantlee Hinde MRN: [id number] Date of Birth: 19-Nov-1934 Referring Provider (PT): Gaynelle Arabian   Encounter Date: 04/26/2021   PT End of Session - 04/26/21 1355     Visit Number 2    Number of Visits 24    Date for PT Re-Evaluation 07/14/21    Authorization Type [facility] [employer]    Progress Note Due on Visit 10    PT Start Time 1350    PT Stop Time 1433    PT Time Calculation (min) 43 min    Equipment Utilized During Treatment Gait belt    Activity Tolerance Patient tolerated treatment well    Behavior During Therapy WFL for tasks assessed/performed             Past Medical History:  Diagnosis Date   Arthritis    Frequent headaches    GERD (gastroesophageal reflux disease)    Hyperlipidemia    Pneumonia    PONV (postoperative nausea and vomiting)     Past Surgical History:  Procedure Laterality Date   ABDOMINAL HYSTERECTOMY     ANKLE SURGERY     EYE SURGERY     Left hip replacement Left 2013   right hip replacement Right 2000   TONSILLECTOMY AND ADENOIDECTOMY  1943   TOTAL KNEE ARTHROPLASTY Right 02/23/2020   Procedure: TOTAL KNEE ARTHROPLASTY;  Surgeon: Gaynelle Arabian, MD;  Location: [facility];  Service: Orthopedics;  Laterality: Right;  30min   TOTAL KNEE ARTHROPLASTY Left 04/18/2021   Procedure: TOTAL KNEE ARTHROPLASTY;  Surgeon: Gaynelle Arabian, MD;  Location: [facility];  Service: Orthopedics;  Laterality: Left;   WISDOM TOOTH EXTRACTION      There were no vitals filed for this visit.   Subjective Assessment - 04/26/21 1353     Subjective Pt reports she is no longer taking narcotics or asprin. Reports she has had continued pain in the left knee. Report she has been able to complete her exercises at home.    Patient is accompained by: Family member    Pertinent  History Pt reports surgery on 02/16/21 on the Left knee. Reports knee pain of 4-5/10 and complains of mostly soreness. Pt has had history of bilateral THA and right TKA in addition to current TKA procedure. Pt reports she was provided with HEP but has not been able to copmlete to this point. Pt is taking 1 tramadol in the AM and at night and is taking asprin.    Limitations Standing;Walking;House hold activities    How long can you stand comfortably? 5- 8 min    How long can you walk comfortably? 5-8 min    Currently in Pain? Yes    Pain Score 6     Pain Location Knee    Pain Orientation Left    Pain Descriptors / Indicators Sore    Pain Type Surgical pain    Pain Onset In the past 7 days    Pain Frequency Constant                Exercise/Activity Sets/Reps/Time/ Resistance Assistance Charge type Comments  Heel slides seated   15 x 5 sec holds   Therex  Towel under heel   SAQ 15 x 5 sec hold  Therex    LAQ X20 x 5 sec hold   Therex   Nuestep  X 6 min level 1 setup Therex  For PROM on the right knee   Heel slides- supine  2 x 10  Assistance with end range flexion motion on second set  Therex Assistance with end range overpressure on second se   SLR  2 x 10   Therex    Heel raises  2 x 10  Walker for balance  Therex  To improve ankle strength   ICE X4 min  No charge                      Treatment Provided this session   Pt educated throughout session about proper posture and technique with exercises. Improved exercise technique, movement at target joints, use of target muscles after min to mod verbal, visual, tactile cues. Note: Portions of this document were prepared using Dragon voice recognition software and although reviewed may contain unintentional dictation errors in syntax, grammar, or spelling.                            PT Short Term Goals - 04/21/21 1047       PT SHORT TERM GOAL #1   Title Patient will be independent in home exercise  program to improve strength/mobility for better functional independence with ADLs.    Baseline pt has initial HEP from hospital but not been able to complete to this point    Time 4    Period Weeks    Status New    Target Date 05/19/21      PT SHORT TERM GOAL #2   Title Patient will increase FOTO score to equal to or greater than  54   to demonstrate statistically significant improvement in mobility and quality of life.    Baseline FOTO 49 at IE,    Time 4    Period Weeks    Status New               PT Long Term Goals - 04/21/21 1207       PT LONG TERM GOAL #1   Title Pt will improve Left knee ROM to 0-120 in order to facilatate smooth gait pattern and stair navigation    Baseline 9-60 on IE    Time 12    Period Weeks    Status New    Target Date 07/14/21      PT LONG TERM GOAL #2   Title Patient (> 84 years old) will complete five times sit to stand test in < 15 seconds indicating an increased LE strength and improved balance.    Baseline 39.1    Time 12    Period Weeks    Status New    Target Date 07/14/21      PT LONG TERM GOAL #3   Title Patient will increase six minute walk test distance to >1000 for progression to community ambulator and improve gait ability    Baseline unable to test at eval, ambulating with walker    Time 12    Period Weeks    Status New    Target Date 07/14/21      PT LONG TERM GOAL #4   Title Patient will increase 10 meter walk test to >1.31m/s as to improve gait speed for better community ambulation and to reduce fall risk.    Baseline .1m/s 12/15 with RW    Time 12    Period Weeks    Status New  Target Date 07/14/21      PT LONG TERM GOAL #5   Title Patient will be require no assist with ascend/descend 3 steps with step over gait pattern to facilitate safe and effecient entry to home    Baseline step to gait pattern at home    Time 10    Period Weeks    Status New    Target Date 06/30/21                   Plan -  04/26/21 1357     Clinical Impression Statement Pt tolerated PT treatment session well. She shows progress with knee flexion range of motion at this time and is progressing as expected at this time. Was able to tolerate several new exercises to improve knee and hip strength as well as knee range of motion. Pt making good progress at this time and will continue to benefit from skilled PT intervention in order to improve her left knee ROM, strength, and to improve her overall mobility and QOL.    Personal Factors and Comorbidities Age    Examination-Activity Limitations Bed Mobility;Bend;Lift;Reach Overhead;Squat;Stairs;Stand    Examination-Participation Restrictions Driving;Community Activity;Meal Prep    Stability/Clinical Decision Making Stable/Uncomplicated    Rehab Potential Good    PT Frequency 2x / week    PT Duration 12 weeks    PT Treatment/Interventions ADLs/Self Care Home Management;Aquatic Therapy;Electrical Stimulation;Moist Heat;DME Instruction;Gait training;Stair training;Functional mobility training;Therapeutic activities;Therapeutic exercise;Balance training;Neuromuscular re-education;Manual techniques;Manual lymph drainage;Compression bandaging;Passive range of motion;Joint Manipulations;Energy conservation;Dry needling;Taping;Vasopneumatic Device    PT Next Visit Plan Progress HEP, manual stretching    PT Home Exercise Plan handount provided, heel slides, quad sets, SLR, heel raises,    Consulted and Agree with Plan of Care Patient;Family member/caregiver             Patient will benefit from skilled therapeutic intervention in order to improve the following deficits and impairments:  Abnormal gait, Decreased activity tolerance, Decreased endurance, Decreased range of motion, Decreased strength, Improper body mechanics, Decreased balance, Difficulty walking, Decreased mobility  Visit Diagnosis: Abnormality of gait and mobility  Acute pain of left knee  History of knee  replacement, total, left     Problem List Patient Active Problem List   Diagnosis Date Noted   Primary osteoarthritis of left knee 04/18/2021   Weight loss 10/25/2020   Osteoporosis 07/07/2020   Primary osteoarthritis of right knee 02/23/2020   Lumbar radiculopathy 09/27/2018   Osteoarthritis of knee 08/26/2018   Degeneration of lumbar intervertebral disc 08/20/2018   Bilateral chronic knee pain 06/06/2018   Hyperlipidemia    GERD (gastroesophageal reflux disease)    Frequent headaches    Arthritis     Particia Lather, PT 04/26/2021, 4:10 PM  [place], [place], [address] Phone: [phone]   Fax:  [phone]  Name: Yessika Otte MRN: [id number] Date of Birth: March 11, 1935

## 2021-04-28 ENCOUNTER — Ambulatory Visit: Payer: Medicare Other | Admitting: Physical Therapy

## 2021-04-28 ENCOUNTER — Other Ambulatory Visit: Payer: Self-pay

## 2021-04-28 DIAGNOSIS — M25562 Pain in left knee: Secondary | ICD-10-CM | POA: Diagnosis not present

## 2021-04-28 DIAGNOSIS — R269 Unspecified abnormalities of gait and mobility: Secondary | ICD-10-CM

## 2021-04-28 DIAGNOSIS — Z96652 Presence of left artificial knee joint: Secondary | ICD-10-CM

## 2021-04-28 DIAGNOSIS — R262 Difficulty in walking, not elsewhere classified: Secondary | ICD-10-CM | POA: Diagnosis not present

## 2021-04-28 DIAGNOSIS — M6281 Muscle weakness (generalized): Secondary | ICD-10-CM | POA: Diagnosis not present

## 2021-04-28 NOTE — Therapy (Signed)
[place] MAIN [facility] SERVICES [address] [place], [place], [address] Phone: [phone]   Fax:  [phone]  Physical Therapy Treatment  Patient Details  Name: Sherry Lamb MRN: [id number] Date of Birth: [birth year] Referring Provider (PT): Gaynelle Arabian   Encounter Date: 04/28/2021   PT End of Session - 04/28/21 1612     Visit Number 3    Number of Visits 24    Date for PT Re-Evaluation 07/14/21    Authorization Type [facility] [employer]    Progress Note Due on Visit 10    PT Start Time 1600    PT Stop Time 1645    PT Time Calculation (min) 45 min    Equipment Utilized During Treatment Gait belt    Activity Tolerance Patient tolerated treatment well    Behavior During Therapy WFL for tasks assessed/performed             Past Medical History:  Diagnosis Date   Arthritis    Frequent headaches    GERD (gastroesophageal reflux disease)    Hyperlipidemia    Pneumonia    PONV (postoperative nausea and vomiting)     Past Surgical History:  Procedure Laterality Date   ABDOMINAL HYSTERECTOMY     ANKLE SURGERY     EYE SURGERY     Left hip replacement Left 2013   right hip replacement Right 2000   TONSILLECTOMY AND ADENOIDECTOMY  1943   TOTAL KNEE ARTHROPLASTY Right 02/23/2020   Procedure: TOTAL KNEE ARTHROPLASTY;  Surgeon: Gaynelle Arabian, MD;  Location: [facility];  Service: Orthopedics;  Laterality: Right;  83min   TOTAL KNEE ARTHROPLASTY Left 04/18/2021   Procedure: TOTAL KNEE ARTHROPLASTY;  Surgeon: Gaynelle Arabian, MD;  Location: [facility];  Service: Orthopedics;  Laterality: Left;   WISDOM TOOTH EXTRACTION      There were no vitals filed for this visit.   Subjective Assessment - 04/28/21 1603     Subjective Pt reports incident with her daughter's dog where dog was "barrelling" into her. She reports she mostly got ipmact on her R LE but dog brished her contralateral LE. reports feels this " set her back a day".    Patient is accompained  by: Family member    Pertinent History Pt reports surgery on 02/16/21 on the Left knee. Reports knee pain of 4-5/10 and complains of mostly soreness. Pt has had history of bilateral THA and right TKA in addition to current TKA procedure. Pt reports she was provided with HEP but has not been able to copmlete to this point. Pt is taking 1 tramadol in the AM and at night and is taking asprin.    Limitations Standing;Walking;House hold activities    How long can you stand comfortably? 5- 8 min    How long can you walk comfortably? 5-8 min    Currently in Pain? Yes    Pain Score 7     Pain Location Knee    Pain Orientation Left    Pain Descriptors / Indicators Sore    Pain Type Surgical pain    Pain Onset In the past 7 days             Exercise/Activity Sets/Reps/Time/ Resistance Assistance Charge type Comments  Heel slides seated   15 x 5 sec holds   Therex  Towel under heel   SAQ 2 x 10 x 5 sec hold 2.5# AW   Therex    LAQ X20 x 5 sec hold   Therex  Nuestep X 6 min level 1 setup Therex  For PROM on the right knee   Heel slides- supine  2 x 10  Assistance with end range flexion motion on second set  Therex Assistance with end range overpressure on second se   SLR  2 x 10   Therex    Heel raises  2 x 10  Walker for balance  Therex  To improve ankle strength   Hip Abduction supine  2 x 10   Therex  Towel and board under heel to decrease coefficient of friction   Marching  X 15 ea   Therex  For practice of stepping and foot clearance                ICE X4 min  No charge    Treatment Provided this session   Pt educated throughout session about proper posture and technique with exercises. Improved exercise technique, movement at target joints, use of target muscles after min to mod verbal, visual, tactile cues.  Note: Portions of this document were prepared using Dragon voice recognition software and although reviewed may contain unintentional dictation errors in syntax, grammar, or  spelling.                             PT Short Term Goals - 04/21/21 1047       PT SHORT TERM GOAL #1   Title Patient will be independent in home exercise program to improve strength/mobility for better functional independence with ADLs.    Baseline pt has initial HEP from hospital but not been able to complete to this point    Time 4    Period Weeks    Status New    Target Date 05/19/21      PT SHORT TERM GOAL #2   Title Patient will increase FOTO score to equal to or greater than  54   to demonstrate statistically significant improvement in mobility and quality of life.    Baseline FOTO 49 at IE,    Time 4    Period Weeks    Status New               PT Long Term Goals - 04/21/21 1207       PT LONG TERM GOAL #1   Title Pt will improve Left knee ROM to 0-120 in order to facilatate smooth gait pattern and stair navigation    Baseline 9-60 on IE    Time 12    Period Weeks    Status New    Target Date 07/14/21      PT LONG TERM GOAL #2   Title Patient (> 15 years old) will complete five times sit to stand test in < 15 seconds indicating an increased LE strength and improved balance.    Baseline 39.1    Time 12    Period Weeks    Status New    Target Date 07/14/21      PT LONG TERM GOAL #3   Title Patient will increase six minute walk test distance to >1000 for progression to community ambulator and improve gait ability    Baseline unable to test at eval, ambulating with walker    Time 12    Period Weeks    Status New    Target Date 07/14/21      PT LONG TERM GOAL #4   Title Patient will increase 10 meter walk test  to >1.72m/s as to improve gait speed for better community ambulation and to reduce fall risk.    Baseline .34m/s 12/15 with RW    Time 12    Period Weeks    Status New    Target Date 07/14/21      PT LONG TERM GOAL #5   Title Patient will be require no assist with ascend/descend 3 steps with step over gait pattern to  facilitate safe and effecient entry to home    Baseline step to gait pattern at home    Time 10    Period Weeks    Status New    Target Date 06/30/21                   Plan - 04/28/21 1612     Clinical Impression Statement Pt tolerated PT treatment session well. She shows progress with knee flexion range of motion at this time and is progressing as expected at this time. Pt still demonstrates stiff knee pattern of gait when ambulating with rolling walker but ambulation appearted to be at increased speed in today;s session. Pt making good progress at this time and will continue to benefit from skilled PT intervention in order to improve her left knee ROM, strength, and to improve her overall mobility and QOL.    Personal Factors and Comorbidities Age    Examination-Activity Limitations Bed Mobility;Bend;Lift;Reach Overhead;Squat;Stairs;Stand    Examination-Participation Restrictions Driving;Community Activity;Meal Prep    Stability/Clinical Decision Making Stable/Uncomplicated    Rehab Potential Good    PT Frequency 2x / week    PT Duration 12 weeks    PT Treatment/Interventions ADLs/Self Care Home Management;Aquatic Therapy;Electrical Stimulation;Moist Heat;DME Instruction;Gait training;Stair training;Functional mobility training;Therapeutic activities;Therapeutic exercise;Balance training;Neuromuscular re-education;Manual techniques;Manual lymph drainage;Compression bandaging;Passive range of motion;Joint Manipulations;Energy conservation;Dry needling;Taping;Vasopneumatic Device    PT Next Visit Plan Progress HEP, manual stretching    PT Home Exercise Plan handount provided, heel slides, quad sets, SLR, heel raises,    Consulted and Agree with Plan of Care Patient;Family member/caregiver             Patient will benefit from skilled therapeutic intervention in order to improve the following deficits and impairments:  Abnormal gait, Decreased activity tolerance, Decreased  endurance, Decreased range of motion, Decreased strength, Improper body mechanics, Decreased balance, Difficulty walking, Decreased mobility  Visit Diagnosis: Abnormality of gait and mobility  Acute pain of left knee  History of knee replacement, total, left     Problem List Patient Active Problem List   Diagnosis Date Noted   Primary osteoarthritis of left knee 04/18/2021   Weight loss 10/25/2020   Osteoporosis 07/07/2020   Primary osteoarthritis of right knee 02/23/2020   Lumbar radiculopathy 09/27/2018   Osteoarthritis of knee 08/26/2018   Degeneration of lumbar intervertebral disc 08/20/2018   Bilateral chronic knee pain 06/06/2018   Hyperlipidemia    GERD (gastroesophageal reflux disease)    Frequent headaches    Arthritis     Particia Lather, PT 04/29/2021, 8:20 AM  [place], [place], [address] Phone: [phone]   Fax:  [phone]  Name: Sherry Lamb MRN: [id number] Date of Birth: 1935/03/29

## 2021-04-29 ENCOUNTER — Encounter: Payer: Self-pay | Admitting: Physical Therapy

## 2021-05-04 ENCOUNTER — Ambulatory Visit: Payer: Medicare Other | Admitting: Physical Therapy

## 2021-05-04 ENCOUNTER — Other Ambulatory Visit: Payer: Self-pay

## 2021-05-04 DIAGNOSIS — R269 Unspecified abnormalities of gait and mobility: Secondary | ICD-10-CM

## 2021-05-04 DIAGNOSIS — Z96652 Presence of left artificial knee joint: Secondary | ICD-10-CM | POA: Diagnosis not present

## 2021-05-04 DIAGNOSIS — M25562 Pain in left knee: Secondary | ICD-10-CM

## 2021-05-04 DIAGNOSIS — M6281 Muscle weakness (generalized): Secondary | ICD-10-CM | POA: Diagnosis not present

## 2021-05-04 DIAGNOSIS — R262 Difficulty in walking, not elsewhere classified: Secondary | ICD-10-CM | POA: Diagnosis not present

## 2021-05-04 NOTE — Therapy (Signed)
[place] MAIN [facility] SERVICES [address] [place], [place], [address] Phone: [phone]   Fax:  [phone]  Physical Therapy Treatment  Patient Details  Name: Sherry Lamb MRN: [id number] Date of Birth: 25-Apr-1935 Referring Provider (PT): Gaynelle Arabian   Encounter Date: 05/04/2021   PT End of Session - 05/04/21 1350     Visit Number 4    Number of Visits 24    Date for PT Re-Evaluation 07/14/21    Authorization Type [facility]    Progress Note Due on Visit 10    PT Start Time 3818    PT Stop Time 1429    PT Time Calculation (min) 44 min    Equipment Utilized During Treatment Gait belt    Activity Tolerance Patient tolerated treatment well    Behavior During Therapy WFL for tasks assessed/performed             Past Medical History:  Diagnosis Date   Arthritis    Frequent headaches    GERD (gastroesophageal reflux disease)    Hyperlipidemia    Pneumonia    PONV (postoperative nausea and vomiting)     Past Surgical History:  Procedure Laterality Date   ABDOMINAL HYSTERECTOMY     ANKLE SURGERY     EYE SURGERY     Left hip replacement Left 2013   right hip replacement Right 2000   TONSILLECTOMY AND ADENOIDECTOMY  1943   TOTAL KNEE ARTHROPLASTY Right 02/23/2020   Procedure: TOTAL KNEE ARTHROPLASTY;  Surgeon: Gaynelle Arabian, MD;  Location: [facility];  Service: Orthopedics;  Laterality: Right;  108min   TOTAL KNEE ARTHROPLASTY Left 04/18/2021   Procedure: TOTAL KNEE ARTHROPLASTY;  Surgeon: Gaynelle Arabian, MD;  Location: [facility];  Service: Orthopedics;  Laterality: Left;   WISDOM TOOTH EXTRACTION      There were no vitals filed for this visit.   Subjective Assessment - 05/04/21 1348     Subjective Pt reports incident with her daughter's dog where dog was "barrelling" into her. She reports she mostly got ipmact on her R LE but dog brished her contralateral LE. reports feels this " set her back a day".    Patient is accompained  by: Family member    Pertinent History Pt reports surgery on 02/16/21 on the Left knee. Reports knee pain of 4-5/10 and complains of mostly soreness. Pt has had history of bilateral THA and right TKA in addition to current TKA procedure. Pt reports she was provided with HEP but has not been able to copmlete to this point. Pt is taking 1 tramadol in the AM and at night and is taking asprin.    Limitations Standing;Walking;House hold activities    How long can you stand comfortably? 5- 8 min    How long can you walk comfortably? 5-8 min    Pain Score 5     Pain Location Knee    Pain Orientation Left    Pain Descriptors / Indicators Sore    Pain Type Surgical pain    Pain Onset In the past 7 days    Pain Frequency Constant                 Exercise/Activity Sets/Reps/Time/ Resistance Assistance Charge type Comments  Heel slides seated   15 x 5 sec holds   Therex  Towel under heel   SAQ 2 x 10 x 5 sec hold 2.5# AW   Therex    LAQ/ knee flexion  X15 x  5 sec hold , 2.5# AW  Therex   Nuestep X 6 min level 1 setup Therex  For PROM on the right knee   Heel slides- supine  2 x 10  Assistance with end range flexion motion on second set  Therex Assistance with end range overpressure on second se   SLR  2 x 10   Therex  Instruction to keep knee in extension as if completing quad set with each rep   Heel raises  2 x 10  Walker for balance  Therex  To improve ankle strength   Hip Abduction supine  2 x 10   Therex  Towel and board under heel to decrease coefficient of friction  82 degrees knee flexion noted   Marching  X 15 ea   Therex  For practice of stepping and foot clearance   Knee flexion stretch at steps  2 x 30 sec   Therex  For improving flexion ROM, cues for hold times.   Quad sets  2 x 10   Therex  To improve knee extension ROM   ICE X4 min  No charge    Treatment Provided this session   Pt educated throughout session about proper posture and technique with exercises. Improved  exercise technique, movement at target joints, use of target muscles after min to mod verbal, visual, tactile cues.  Note: Portions of this document were prepared using Dragon voice recognition software and although reviewed may contain unintentional dictation errors in syntax, grammar, or spelling.                                  PT Short Term Goals - 04/21/21 1047       PT SHORT TERM GOAL #1   Title Patient will be independent in home exercise program to improve strength/mobility for better functional independence with ADLs.    Baseline pt has initial HEP from hospital but not been able to complete to this point    Time 4    Period Weeks    Status New    Target Date 05/19/21      PT SHORT TERM GOAL #2   Title Patient will increase FOTO score to equal to or greater than  54   to demonstrate statistically significant improvement in mobility and quality of life.    Baseline FOTO 49 at IE,    Time 4    Period Weeks    Status New               PT Long Term Goals - 04/21/21 1207       PT LONG TERM GOAL #1   Title Pt will improve Left knee ROM to 0-120 in order to facilatate smooth gait pattern and stair navigation    Baseline 9-60 on IE    Time 12    Period Weeks    Status New    Target Date 07/14/21      PT LONG TERM GOAL #2   Title Patient (> 13 years old) will complete five times sit to stand test in < 15 seconds indicating an increased LE strength and improved balance.    Baseline 39.1    Time 12    Period Weeks    Status New    Target Date 07/14/21      PT LONG TERM GOAL #3   Title Patient will increase six minute walk test distance to >  1000 for progression to community ambulator and improve gait ability    Baseline unable to test at eval, ambulating with walker    Time 12    Period Weeks    Status New    Target Date 07/14/21      PT LONG TERM GOAL #4   Title Patient will increase 10 meter walk test to >1.67m/s as to improve  gait speed for better community ambulation and to reduce fall risk.    Baseline .67m/s 12/15 with RW    Time 12    Period Weeks    Status New    Target Date 07/14/21      PT LONG TERM GOAL #5   Title Patient will be require no assist with ascend/descend 3 steps with step over gait pattern to facilitate safe and effecient entry to home    Baseline step to gait pattern at home    Time 10    Period Weeks    Status New    Target Date 06/30/21                   Plan - 05/04/21 1351     Clinical Impression Statement Pt tolerated PT treatment session well. She shows progress with knee flexion range of motion at this time and is progressing as expected at this time. Pt still demonstrates stiff knee pattern of gait when ambulating with rolling walker but ambulation appearted to be at increased speed in today;s session. Pt making good progress at this time and will continue to benefit from skilled PT intervention in order to improve her left knee ROM, strength, and to improve her overall mobility and QOL.    Personal Factors and Comorbidities Age    Examination-Activity Limitations Bed Mobility;Bend;Lift;Reach Overhead;Squat;Stairs;Stand    Examination-Participation Restrictions Driving;Community Activity;Meal Prep    Stability/Clinical Decision Making Stable/Uncomplicated    Rehab Potential Good    PT Frequency 2x / week    PT Duration 12 weeks    PT Treatment/Interventions ADLs/Self Care Home Management;Aquatic Therapy;Electrical Stimulation;Moist Heat;DME Instruction;Gait training;Stair training;Functional mobility training;Therapeutic activities;Therapeutic exercise;Balance training;Neuromuscular re-education;Manual techniques;Manual lymph drainage;Compression bandaging;Passive range of motion;Joint Manipulations;Energy conservation;Dry needling;Taping;Vasopneumatic Device    PT Next Visit Plan Progress HEP, manual stretching    PT Home Exercise Plan handount provided, heel slides,  quad sets, SLR, heel raises,    Consulted and Agree with Plan of Care Patient;Family member/caregiver             Patient will benefit from skilled therapeutic intervention in order to improve the following deficits and impairments:  Abnormal gait, Decreased activity tolerance, Decreased endurance, Decreased range of motion, Decreased strength, Improper body mechanics, Decreased balance, Difficulty walking, Decreased mobility  Visit Diagnosis: Abnormality of gait and mobility  Acute pain of left knee  History of knee replacement, total, left     Problem List Patient Active Problem List   Diagnosis Date Noted   Primary osteoarthritis of left knee 04/18/2021   Weight loss 10/25/2020   Osteoporosis 07/07/2020   Primary osteoarthritis of right knee 02/23/2020   Lumbar radiculopathy 09/27/2018   Osteoarthritis of knee 08/26/2018   Degeneration of lumbar intervertebral disc 08/20/2018   Bilateral chronic knee pain 06/06/2018   Hyperlipidemia    GERD (gastroesophageal reflux disease)    Frequent headaches    Arthritis     Particia Lather, PT 05/04/2021, 2:27 PM  [place] [address] [place], [place], [address] Phone: [phone]  Fax:  [phone]  Name: Yael Coppess MRN: [id number] Date of Birth: Jan 09, 1935

## 2021-05-06 ENCOUNTER — Other Ambulatory Visit: Payer: Self-pay

## 2021-05-06 ENCOUNTER — Ambulatory Visit: Payer: Medicare Other

## 2021-05-06 DIAGNOSIS — M6281 Muscle weakness (generalized): Secondary | ICD-10-CM | POA: Diagnosis not present

## 2021-05-06 DIAGNOSIS — Z96652 Presence of left artificial knee joint: Secondary | ICD-10-CM

## 2021-05-06 DIAGNOSIS — M25562 Pain in left knee: Secondary | ICD-10-CM | POA: Diagnosis not present

## 2021-05-06 DIAGNOSIS — R269 Unspecified abnormalities of gait and mobility: Secondary | ICD-10-CM | POA: Diagnosis not present

## 2021-05-06 DIAGNOSIS — R262 Difficulty in walking, not elsewhere classified: Secondary | ICD-10-CM | POA: Diagnosis not present

## 2021-05-06 NOTE — Therapy (Signed)
[place] MAIN [facility] SERVICES [address] [place], [place], [address] Phone: [phone]   Fax:  [phone]  Physical Therapy Treatment  Patient Details  Name: Sherry Lamb MRN: [id number] Date of Birth: [birth year] Referring Provider (PT): Gaynelle Arabian   Encounter Date: 05/06/2021   PT End of Session - 05/06/21 0954     Visit Number 5    Number of Visits 24    Date for PT Re-Evaluation 07/14/21    Authorization Type [facility] [employer]    Progress Note Due on Visit 10    PT Start Time 0944    PT Stop Time 1030    PT Time Calculation (min) 46 min    Equipment Utilized During Treatment Gait belt    Activity Tolerance Patient tolerated treatment well    Behavior During Therapy WFL for tasks assessed/performed             Past Medical History:  Diagnosis Date   Arthritis    Frequent headaches    GERD (gastroesophageal reflux disease)    Hyperlipidemia    Pneumonia    PONV (postoperative nausea and vomiting)     Past Surgical History:  Procedure Laterality Date   ABDOMINAL HYSTERECTOMY     ANKLE SURGERY     EYE SURGERY     Left hip replacement Left 2013   right hip replacement Right 2000   TONSILLECTOMY AND ADENOIDECTOMY  1943   TOTAL KNEE ARTHROPLASTY Right 02/23/2020   Procedure: TOTAL KNEE ARTHROPLASTY;  Surgeon: Gaynelle Arabian, MD;  Location: [facility];  Service: Orthopedics;  Laterality: Right;  68min   TOTAL KNEE ARTHROPLASTY Left 04/18/2021   Procedure: TOTAL KNEE ARTHROPLASTY;  Surgeon: Gaynelle Arabian, MD;  Location: [facility];  Service: Orthopedics;  Laterality: Left;   WISDOM TOOTH EXTRACTION      There were no vitals filed for this visit.   Subjective Assessment - 05/06/21 0951     Subjective Patient reports feeling okay today- States the ortho MD is pleased with her progress. She reports feeling better overall.    Patient is accompained by: Family member    Pertinent History Pt reports surgery on 02/16/21 on the Left  knee. Reports knee pain of 4-5/10 and complains of mostly soreness. Pt has had history of bilateral THA and right TKA in addition to current TKA procedure. Pt reports she was provided with HEP but has not been able to copmlete to this point. Pt is taking 1 tramadol in the AM and at night and is taking asprin.    Limitations Standing;Walking;House hold activities    How long can you stand comfortably? 5- 8 min    How long can you walk comfortably? 5-8 min    Currently in Pain? Yes    Pain Score 4     Pain Location Knee    Pain Orientation Left    Pain Descriptors / Indicators Sore    Pain Type Surgical pain    Pain Onset In the past 7 days    Pain Frequency Constant               INTERVENTIONS:    Exercise/Activity Sets/Reps/Time/ Resistance Assistance Charge type Comments  Heel slides seated    2 sets of 15 reps x 5 sec holds    Therex  Pillow case under heel   Standing knee ext stretch at steps  3 sets of 30 sec hold    Therex   VC for correct technique  LAQ/  knee flexion  X15 x 5 sec hold , 2.5# AW   Therex    Nuestep X 6 min level 1 setup Therex  For PROM on the right knee   Step tap onto 1step with Left LE  X 15 reps with 2.5 lb AW   Therex   SLR  2 x 10    Therex  Instruction to keep knee in extension as if completing quad set with each rep         Hip Abduction supine  2 x 10    Therex  Towel and board under heel to decrease coefficient of friction  82 degrees knee flexion noted   Marching  X 15 ea    Therex  For practice of stepping and foot clearance   Knee flexion stretch at steps  2 x 30 sec    Therex  For improving flexion ROM, cues for hold times.   Quad sets  2 x 10    Therex  To improve knee extension ROM    Manual therapy- PROM to Left knee in supine X 10 min   Manual Therapy   Measured AROM after Manual Therapy. Knee ext= lacking 10 deg from zero to 88 deg of knee flex  Treatment Provided this session    Pt educated throughout session about proper posture  and technique with exercises. Improved exercise technique, movement at target joints, use of target muscles after min to mod verbal, visual, tactile cues.   Note: Portions of this document were prepared using Dragon voice recognition software and although reviewed may contain unintentional dictation errors in syntax, grammar, or spelling.                          PT Education - 05/06/21 0954     Education Details Exercise technique.    Person(s) Educated Patient    Methods Explanation;Demonstration;Tactile cues;Verbal cues    Comprehension Verbalized understanding;Returned demonstration;Verbal cues required;Tactile cues required;Need further instruction              PT Short Term Goals - 04/21/21 1047       PT SHORT TERM GOAL #1   Title Patient will be independent in home exercise program to improve strength/mobility for better functional independence with ADLs.    Baseline pt has initial HEP from hospital but not been able to complete to this point    Time 4    Period Weeks    Status New    Target Date 05/19/21      PT SHORT TERM GOAL #2   Title Patient will increase FOTO score to equal to or greater than  54   to demonstrate statistically significant improvement in mobility and quality of life.    Baseline FOTO 49 at IE,    Time 4    Period Weeks    Status New               PT Long Term Goals - 04/21/21 1207       PT LONG TERM GOAL #1   Title Pt will improve Left knee ROM to 0-120 in order to facilatate smooth gait pattern and stair navigation    Baseline 9-60 on IE    Time 12    Period Weeks    Status New    Target Date 07/14/21      PT LONG TERM GOAL #2   Title Patient (> 72 years old) will complete five times  sit to stand test in < 15 seconds indicating an increased LE strength and improved balance.    Baseline 39.1    Time 12    Period Weeks    Status New    Target Date 07/14/21      PT LONG TERM GOAL #3   Title Patient will  increase six minute walk test distance to >1000 for progression to community ambulator and improve gait ability    Baseline unable to test at eval, ambulating with walker    Time 12    Period Weeks    Status New    Target Date 07/14/21      PT LONG TERM GOAL #4   Title Patient will increase 10 meter walk test to >1.51m/s as to improve gait speed for better community ambulation and to reduce fall risk.    Baseline .103m/s 12/15 with RW    Time 12    Period Weeks    Status New    Target Date 07/14/21      PT LONG TERM GOAL #5   Title Patient will be require no assist with ascend/descend 3 steps with step over gait pattern to facilitate safe and effecient entry to home    Baseline step to gait pattern at home    Time 10    Period Weeks    Status New    Target Date 06/30/21                   Plan - 05/06/21 1009     Clinical Impression Statement Patient presents with good motivation during today's session with good pain control. She was able to return demonstration with all exercises today and able to progress well - demo improved overall ROM today. She will continue to benefit from skilled PT intervention in order to improve her left knee ROM, strength, and to improve her overall mobility and QOL.    Personal Factors and Comorbidities Age    Examination-Activity Limitations Bed Mobility;Bend;Lift;Reach Overhead;Squat;Stairs;Stand    Examination-Participation Restrictions Driving;Community Activity;Meal Prep    Stability/Clinical Decision Making Stable/Uncomplicated    Rehab Potential Good    PT Frequency 2x / week    PT Duration 12 weeks    PT Treatment/Interventions ADLs/Self Care Home Management;Aquatic Therapy;Electrical Stimulation;Moist Heat;DME Instruction;Gait training;Stair training;Functional mobility training;Therapeutic activities;Therapeutic exercise;Balance training;Neuromuscular re-education;Manual techniques;Manual lymph drainage;Compression bandaging;Passive  range of motion;Joint Manipulations;Energy conservation;Dry needling;Taping;Vasopneumatic Device    PT Next Visit Plan Progress HEP, manual stretching    PT Home Exercise Plan handount provided, heel slides, quad sets, SLR, heel raises,    Consulted and Agree with Plan of Care Patient;Family member/caregiver             Patient will benefit from skilled therapeutic intervention in order to improve the following deficits and impairments:  Abnormal gait, Decreased activity tolerance, Decreased endurance, Decreased range of motion, Decreased strength, Improper body mechanics, Decreased balance, Difficulty walking, Decreased mobility  Visit Diagnosis: Abnormality of gait and mobility  Difficulty in walking, not elsewhere classified  Muscle weakness (generalized)  Acute pain of left knee  Status post total left knee replacement     Problem List Patient Active Problem List   Diagnosis Date Noted   Primary osteoarthritis of left knee 04/18/2021   Weight loss 10/25/2020   Osteoporosis 07/07/2020   Primary osteoarthritis of right knee 02/23/2020   Lumbar radiculopathy 09/27/2018   Osteoarthritis of knee 08/26/2018   Degeneration of lumbar intervertebral disc 08/20/2018   Bilateral chronic knee pain  06/06/2018   Hyperlipidemia    GERD (gastroesophageal reflux disease)    Frequent headaches    Arthritis     Lewis Moccasin, PT 05/06/2021, 11:28 PM  [place] MAIN [facility] SERVICES [address] [place], [place], [address] Phone: [phone]   Fax:  [phone]  Name: Sherry Lamb MRN: [id number] Date of Birth: 1935/03/02

## 2021-05-10 ENCOUNTER — Ambulatory Visit: Payer: Medicare Other | Attending: Orthopedic Surgery

## 2021-05-10 ENCOUNTER — Other Ambulatory Visit: Payer: Self-pay

## 2021-05-10 DIAGNOSIS — M25562 Pain in left knee: Secondary | ICD-10-CM | POA: Insufficient documentation

## 2021-05-10 DIAGNOSIS — M6281 Muscle weakness (generalized): Secondary | ICD-10-CM | POA: Diagnosis present

## 2021-05-10 DIAGNOSIS — Z96652 Presence of left artificial knee joint: Secondary | ICD-10-CM | POA: Insufficient documentation

## 2021-05-10 DIAGNOSIS — R269 Unspecified abnormalities of gait and mobility: Secondary | ICD-10-CM | POA: Diagnosis present

## 2021-05-10 DIAGNOSIS — R262 Difficulty in walking, not elsewhere classified: Secondary | ICD-10-CM | POA: Diagnosis present

## 2021-05-10 NOTE — Therapy (Signed)
[place] MAIN [facility] SERVICES [address] [place], [place], [address] Phone: [phone]   Fax:  [phone]  Physical Therapy Treatment  Patient Details  Name: Sherry Lamb MRN: [id number] Date of Birth: 17-Dec-1934 Referring Provider (PT): Gaynelle Arabian   Encounter Date: 05/10/2021   PT End of Session - 05/10/21 1749     Visit Number 6    Number of Visits 24    Date for PT Re-Evaluation 07/14/21    Authorization Type [facility]    Progress Note Due on Visit 10    PT Start Time 1600    PT Stop Time 3151    PT Time Calculation (min) 46 min    Equipment Utilized During Treatment Gait belt    Activity Tolerance Patient limited by pain    Behavior During Therapy WFL for tasks assessed/performed             Past Medical History:  Diagnosis Date   Arthritis    Frequent headaches    GERD (gastroesophageal reflux disease)    Hyperlipidemia    Pneumonia    PONV (postoperative nausea and vomiting)     Past Surgical History:  Procedure Laterality Date   ABDOMINAL HYSTERECTOMY     ANKLE SURGERY     EYE SURGERY     Left hip replacement Left 2013   right hip replacement Right 2000   TONSILLECTOMY AND ADENOIDECTOMY  1943   TOTAL KNEE ARTHROPLASTY Right 02/23/2020   Procedure: TOTAL KNEE ARTHROPLASTY;  Surgeon: Gaynelle Arabian, MD;  Location: [facility];  Service: Orthopedics;  Laterality: Right;  76min   TOTAL KNEE ARTHROPLASTY Left 04/18/2021   Procedure: TOTAL KNEE ARTHROPLASTY;  Surgeon: Gaynelle Arabian, MD;  Location: [facility];  Service: Orthopedics;  Laterality: Left;   WISDOM TOOTH EXTRACTION      There were no vitals filed for this visit.   Subjective Assessment - 05/10/21 1748     Subjective Patient reports having a bad day and a stressful holiday.    Patient is accompained by: Family member    Pertinent History Pt reports surgery on 02/16/21 on the Left knee. Reports knee pain of 4-5/10 and complains of mostly soreness. Pt has  had history of bilateral THA and right TKA in addition to current TKA procedure. Pt reports she was provided with HEP but has not been able to copmlete to this point. Pt is taking 1 tramadol in the AM and at night and is taking asprin.    Limitations Standing;Walking;House hold activities    How long can you stand comfortably? 5- 8 min    How long can you walk comfortably? 5-8 min    Currently in Pain? Yes    Pain Score 6     Pain Location Knee    Pain Orientation Left    Pain Descriptors / Indicators Aching    Pain Type Surgical pain    Pain Onset In the past 7 days    Pain Frequency Intermittent                Patient performed a sit to stand from transport chair, reports feeling a pop and pain in her surgical knee. Returned to sitting position and leg examined. No obvious injuries noted, positioned into supine for further assessment with no injuries found. Ice applied and patient notified to monitor knee and call ortho if pain progresses in the next day or so.    Supine with ice on knee  Hamstring lengthening  60 seconds x 2 trials  Knee over bolster:  -SAQ 10x; 2 sets with cues for quad contraction -knee flexion over bolster stretch 30 seconds x 2 trials  Hamstring curl with swiss ball 10x; 2 sets Hamstring stretch on swiss ball 10x 2 sets Patella mobilizations grade I-II; x3 minutes  Seated: Contract relax agonist muscle 10x 4  LAQ 10x   INTERVENTIONS:   Pt educated throughout session about proper posture and technique with exercises. Improved exercise technique, movement at target joints, use of target muscles after min to mod verbal, visual, tactile cues.    Patient performed a sit to stand from transport chair, reports feeling a pop and pain in her surgical knee. Returned to sitting position and leg examined. No obvious injuries noted, positioned into supine for further assessment with no injuries found. Ice applied and patient notified to monitor knee and call ortho  if pain progresses in the next day or so. Patient able to participate in regressed interventions without pain increase She will continue to benefit from skilled PT intervention in order to improve her left knee ROM, strength, and to improve her overall mobility and QOL.                     PT Education - 05/10/21 1749     Education Details monitor knee    Person(s) Educated Patient;Spouse    Methods Explanation    Comprehension Verbalized understanding              PT Short Term Goals - 04/21/21 1047       PT SHORT TERM GOAL #1   Title Patient will be independent in home exercise program to improve strength/mobility for better functional independence with ADLs.    Baseline pt has initial HEP from hospital but not been able to complete to this point    Time 4    Period Weeks    Status New    Target Date 05/19/21      PT SHORT TERM GOAL #2   Title Patient will increase FOTO score to equal to or greater than  54   to demonstrate statistically significant improvement in mobility and quality of life.    Baseline FOTO 49 at IE,    Time 4    Period Weeks    Status New               PT Long Term Goals - 04/21/21 1207       PT LONG TERM GOAL #1   Title Pt will improve Left knee ROM to 0-120 in order to facilatate smooth gait pattern and stair navigation    Baseline 9-60 on IE    Time 12    Period Weeks    Status New    Target Date 07/14/21      PT LONG TERM GOAL #2   Title Patient (> 63 years old) will complete five times sit to stand test in < 15 seconds indicating an increased LE strength and improved balance.    Baseline 39.1    Time 12    Period Weeks    Status New    Target Date 07/14/21      PT LONG TERM GOAL #3   Title Patient will increase six minute walk test distance to >1000 for progression to community ambulator and improve gait ability    Baseline unable to test at eval, ambulating with walker    Time 12    Period Weeks  Status  New    Target Date 07/14/21      PT LONG TERM GOAL #4   Title Patient will increase 10 meter walk test to >1.77m/s as to improve gait speed for better community ambulation and to reduce fall risk.    Baseline .24m/s 12/15 with RW    Time 12    Period Weeks    Status New    Target Date 07/14/21      PT LONG TERM GOAL #5   Title Patient will be require no assist with ascend/descend 3 steps with step over gait pattern to facilitate safe and effecient entry to home    Baseline step to gait pattern at home    Time 10    Period Weeks    Status New    Target Date 06/30/21                   Plan - 05/10/21 1751     Clinical Impression Statement Patient performed a sit to stand from transport chair, reports feeling a pop and pain in her surgical knee. Returned to sitting position and leg examined. No obvious injuries noted, positioned into supine for further assessment with no injuries found. Ice applied and patient notified to monitor knee and call ortho if pain progresses in the next day or so. Patient able to participate in regressed interventions without pain increase She will continue to benefit from skilled PT intervention in order to improve her left knee ROM, strength, and to improve her overall mobility and QOL.    Personal Factors and Comorbidities Age    Examination-Activity Limitations Bed Mobility;Bend;Lift;Reach Overhead;Squat;Stairs;Stand    Examination-Participation Restrictions Driving;Community Activity;Meal Prep    Stability/Clinical Decision Making Stable/Uncomplicated    Rehab Potential Good    PT Frequency 2x / week    PT Duration 12 weeks    PT Treatment/Interventions ADLs/Self Care Home Management;Aquatic Therapy;Electrical Stimulation;Moist Heat;DME Instruction;Gait training;Stair training;Functional mobility training;Therapeutic activities;Therapeutic exercise;Balance training;Neuromuscular re-education;Manual techniques;Manual lymph drainage;Compression  bandaging;Passive range of motion;Joint Manipulations;Energy conservation;Dry needling;Taping;Vasopneumatic Device    PT Next Visit Plan Progress HEP, manual stretching    PT Home Exercise Plan handount provided, heel slides, quad sets, SLR, heel raises,    Consulted and Agree with Plan of Care Patient;Family member/caregiver             Patient will benefit from skilled therapeutic intervention in order to improve the following deficits and impairments:  Abnormal gait, Decreased activity tolerance, Decreased endurance, Decreased range of motion, Decreased strength, Improper body mechanics, Decreased balance, Difficulty walking, Decreased mobility  Visit Diagnosis: Abnormality of gait and mobility  Difficulty in walking, not elsewhere classified  Muscle weakness (generalized)     Problem List Patient Active Problem List   Diagnosis Date Noted   Primary osteoarthritis of left knee 04/18/2021   Weight loss 10/25/2020   Osteoporosis 07/07/2020   Primary osteoarthritis of right knee 02/23/2020   Lumbar radiculopathy 09/27/2018   Osteoarthritis of knee 08/26/2018   Degeneration of lumbar intervertebral disc 08/20/2018   Bilateral chronic knee pain 06/06/2018   Hyperlipidemia    GERD (gastroesophageal reflux disease)    Frequent headaches    Arthritis     Janna Arch, PT, DPT  05/10/2021, 5:55 PM  [place] MAIN [facility] SERVICES [address] [place], [place], [address] Phone: [phone]   Fax:  [phone]  Name: Sherry Lamb MRN: [id number] Date of Birth: 12-Nov-1934

## 2021-05-13 ENCOUNTER — Other Ambulatory Visit: Payer: Self-pay

## 2021-05-13 ENCOUNTER — Encounter: Payer: Self-pay | Admitting: Physical Therapy

## 2021-05-13 ENCOUNTER — Ambulatory Visit: Payer: Medicare Other | Admitting: Physical Therapy

## 2021-05-13 DIAGNOSIS — R269 Unspecified abnormalities of gait and mobility: Secondary | ICD-10-CM

## 2021-05-13 DIAGNOSIS — M6281 Muscle weakness (generalized): Secondary | ICD-10-CM

## 2021-05-13 DIAGNOSIS — R262 Difficulty in walking, not elsewhere classified: Secondary | ICD-10-CM

## 2021-05-13 DIAGNOSIS — Z96652 Presence of left artificial knee joint: Secondary | ICD-10-CM

## 2021-05-13 NOTE — Therapy (Signed)
[place] [facility] [address] [place], [place], [address] Phone: [phone]   Fax:  [phone]  Physical Therapy Treatment  Patient Details  Name: Sherry Lamb MRN: [id number] Date of Birth: [birth year] Referring Provider (PT): Gaynelle Arabian   Encounter Date: 05/13/2021   PT End of Session - 05/13/21 1026     Visit Number 7    Number of Visits 24    Date for PT Re-Evaluation 07/14/21    Authorization Type [facility] [employer]    Progress Note Due on Visit 10    PT Start Time 0830    PT Stop Time 0920    PT Time Calculation (min) 50 min    Equipment Utilized During Treatment Gait belt    Activity Tolerance Patient limited by pain    Behavior During Therapy WFL for tasks assessed/performed             Past Medical History:  Diagnosis Date   Arthritis    Frequent headaches    GERD (gastroesophageal reflux disease)    Hyperlipidemia    Pneumonia    PONV (postoperative nausea and vomiting)     Past Surgical History:  Procedure Laterality Date   ABDOMINAL HYSTERECTOMY     ANKLE SURGERY     EYE SURGERY     Left hip replacement Left 2013   right hip replacement Right 2000   TONSILLECTOMY AND ADENOIDECTOMY  1943   TOTAL KNEE ARTHROPLASTY Right 02/23/2020   Procedure: TOTAL KNEE ARTHROPLASTY;  Surgeon: Gaynelle Arabian, MD;  Location: [facility];  Service: Orthopedics;  Laterality: Right;  54min   TOTAL KNEE ARTHROPLASTY Left 04/18/2021   Procedure: TOTAL KNEE ARTHROPLASTY;  Surgeon: Gaynelle Arabian, MD;  Location: [facility];  Service: Orthopedics;  Laterality: Left;   WISDOM TOOTH EXTRACTION      There were no vitals filed for this visit.   Subjective Assessment - 05/13/21 0833     Subjective Pt reports some knee pain since last visit. Pt also reports some pain in the contralateral hip and knee.    Patient is accompained by: Family member    Pertinent History Pt reports surgery on 02/16/21 on the Left knee. Reports knee pain of  4-5/10 and complains of mostly soreness. Pt has had history of bilateral THA and right TKA in addition to current TKA procedure. Pt reports she was provided with HEP but has not been able to copmlete to this point. Pt is taking 1 tramadol in the AM and at night and is taking asprin.    Limitations Standing;Walking;House hold activities    How long can you stand comfortably? 5- 8 min    How long can you walk comfortably? 5-8 min    Currently in Pain? Yes    Pain Score 6     Pain Location Knee    Pain Orientation Left    Pain Descriptors / Indicators Aching    Pain Type Surgical pain    Pain Onset In the past 7 days    Pain Frequency Intermittent               Exercise/Activity Sets/Reps/Time/ Resistance Assistance Charge type Comments  Heel slides seated   15 x 5 sec holds   Therex  Towel under heel   SAQ 1 x 10 x 5 sec hold   Therex  - PT assist for end range knee extension  -decreased weight due to lack of terminal knee extension this session  LAQ/ knee flexion  X15 x 5 sec hold ,   Therex -decreased weight due to lack of terminal knee extension this session   Nustep X 6 min level 1 setup Therex  For PROM on the right knee, unable to achieve full ROM on nustep this session   Heel slides- supine  1 x 10  Assistance with end range flexion motion Therex Assistance with end range overpressure on second set 82 degrees knee flexion noted on last repetition   Supine quad sets  2 x 10 x 5 sec holds   Therex   Cues for proper muscle activation Towel under heel   Heel raises  2 x 10  Walker for balance  Therex  To improve ankle strength         Marching  X 15 ea   Therex  For practice of stepping and foot clearance   Knee flexion stretch at steps  Hamstring stretch at steps  2 x 30 sec  2 x 30 sec   Therex  For improving flexion ROM, cues for hold times.  -cues for hold times   Quad sets  2 x 10   Therex  To improve knee extension ROM   ICE X4 min  No charge    Treatment Provided  this session   Pt educated throughout session about proper posture and technique with exercises. Improved exercise technique, movement at target joints, use of target muscles after min to mod verbal, visual, tactile cues.  Note: Portions of this document were prepared using Dragon voice recognition software and although reviewed may contain unintentional dictation errors in syntax, grammar, or spelling.                                PT Education - 05/13/21 1026     Education Details Improtance of ice, elevation and knee flexion range of motion    Person(s) Educated Patient    Methods Explanation;Demonstration    Comprehension Verbalized understanding              PT Short Term Goals - 04/21/21 1047       PT SHORT TERM GOAL #1   Title Patient will be independent in home exercise program to improve strength/mobility for better functional independence with ADLs.    Baseline pt has initial HEP from hospital but not been able to complete to this point    Time 4    Period Weeks    Status New    Target Date 05/19/21      PT SHORT TERM GOAL #2   Title Patient will increase FOTO score to equal to or greater than  54   to demonstrate statistically significant improvement in mobility and quality of life.    Baseline FOTO 49 at IE,    Time 4    Period Weeks    Status New               PT Long Term Goals - 04/21/21 1207       PT LONG TERM GOAL #1   Title Pt will improve Left knee ROM to 0-120 in order to facilatate smooth gait pattern and stair navigation    Baseline 9-60 on IE    Time 12    Period Weeks    Status New    Target Date 07/14/21      PT LONG TERM GOAL #2   Title Patient (>  31 years old) will complete five times sit to stand test in < 15 seconds indicating an increased LE strength and improved balance.    Baseline 39.1    Time 12    Period Weeks    Status New    Target Date 07/14/21      PT LONG TERM GOAL #3   Title Patient  will increase six minute walk test distance to >1000 for progression to community ambulator and improve gait ability    Baseline unable to test at eval, ambulating with walker    Time 12    Period Weeks    Status New    Target Date 07/14/21      PT LONG TERM GOAL #4   Title Patient will increase 10 meter walk test to >1.67m/s as to improve gait speed for better community ambulation and to reduce fall risk.    Baseline .59m/s 12/15 with RW    Time 12    Period Weeks    Status New    Target Date 07/14/21      PT LONG TERM GOAL #5   Title Patient will be require no assist with ascend/descend 3 steps with step over gait pattern to facilitate safe and effecient entry to home    Baseline step to gait pattern at home    Time 10    Period Weeks    Status New    Target Date 06/30/21                   Plan - 05/13/21 1028     Clinical Impression Statement Pt tolerated PT treatment session fairly, she was still having some pain and discomfort in her knee since last visit but able to perform activities in today session with improved efficacy compared to her previous visit. . She shows progress with knee extension ROM in todays session but flexion ROM was about the same copmared to sessions last weerk. Pt still demonstrates stiff knee pattern of gait when ambulating with rolling walker and her pattetrn of gait improved slightly followign standing strengtheining and stretching activities.  Pt will continue to benefit from skilled PT intervention in order to improve her left knee ROM, strength, and to improve her overall mobility and QOL    Personal Factors and Comorbidities Age    Examination-Activity Limitations Bed Mobility;Bend;Lift;Reach Overhead;Squat;Stairs;Stand    Examination-Participation Restrictions Driving;Community Activity;Meal Prep    Stability/Clinical Decision Making Stable/Uncomplicated    Rehab Potential Good    PT Frequency 2x / week    PT Duration 12 weeks    PT  Treatment/Interventions ADLs/Self Care Home Management;Aquatic Therapy;Electrical Stimulation;Moist Heat;DME Instruction;Gait training;Stair training;Functional mobility training;Therapeutic activities;Therapeutic exercise;Balance training;Neuromuscular re-education;Manual techniques;Manual lymph drainage;Compression bandaging;Passive range of motion;Joint Manipulations;Energy conservation;Dry needling;Taping;Vasopneumatic Device    PT Next Visit Plan Progress HEP, manual stretching    PT Home Exercise Plan handount provided, heel slides, quad sets, SLR, heel raises,    Consulted and Agree with Plan of Care Patient;Family member/caregiver             Patient will benefit from skilled therapeutic intervention in order to improve the following deficits and impairments:  Abnormal gait, Decreased activity tolerance, Decreased endurance, Decreased range of motion, Decreased strength, Improper body mechanics, Decreased balance, Difficulty walking, Decreased mobility  Visit Diagnosis: Abnormality of gait and mobility  Difficulty in walking, not elsewhere classified  Muscle weakness (generalized)  History of knee replacement, total, left     Problem List Patient Active Problem List  Diagnosis Date Noted   Primary osteoarthritis of left knee 04/18/2021   Weight loss 10/25/2020   Osteoporosis 07/07/2020   Primary osteoarthritis of right knee 02/23/2020   Lumbar radiculopathy 09/27/2018   Osteoarthritis of knee 08/26/2018   Degeneration of lumbar intervertebral disc 08/20/2018   Bilateral chronic knee pain 06/06/2018   Hyperlipidemia    GERD (gastroesophageal reflux disease)    Frequent headaches    Arthritis     Particia Lather, PT 05/13/2021, 10:53 AM  [place] [facility] [address] [place], [place], [address] Phone: [phone]   Fax:  [phone]  Name: Bianney Rockwood MRN: [id number] Date of Birth: 30-Jan-1935

## 2021-05-18 ENCOUNTER — Ambulatory Visit: Payer: Medicare Other | Admitting: Physical Therapy

## 2021-05-18 ENCOUNTER — Other Ambulatory Visit: Payer: Self-pay

## 2021-05-18 DIAGNOSIS — R269 Unspecified abnormalities of gait and mobility: Secondary | ICD-10-CM | POA: Diagnosis not present

## 2021-05-18 DIAGNOSIS — M6281 Muscle weakness (generalized): Secondary | ICD-10-CM

## 2021-05-18 DIAGNOSIS — R262 Difficulty in walking, not elsewhere classified: Secondary | ICD-10-CM

## 2021-05-18 DIAGNOSIS — Z96652 Presence of left artificial knee joint: Secondary | ICD-10-CM

## 2021-05-18 NOTE — Therapy (Signed)
[place] [facility] [address] [place], [place], [address] Phone: [phone]   Fax:  [phone]  Physical Therapy Treatment  Patient Details  Name: Sherry Lamb MRN: [id number] Date of Birth: 1935-03-29 Referring Provider (PT): Gaynelle Arabian   Encounter Date: 05/18/2021   PT End of Session - 05/18/21 1608     Visit Number 8    Number of Visits 24    Date for PT Re-Evaluation 07/14/21    Authorization Type [facility]    Progress Note Due on Visit 10    PT Start Time 1601    PT Stop Time 1645    PT Time Calculation (min) 44 min    Equipment Utilized During Treatment Gait belt    Activity Tolerance Patient limited by pain    Behavior During Therapy WFL for tasks assessed/performed             Past Medical History:  Diagnosis Date   Arthritis    Frequent headaches    GERD (gastroesophageal reflux disease)    Hyperlipidemia    Pneumonia    PONV (postoperative nausea and vomiting)     Past Surgical History:  Procedure Laterality Date   ABDOMINAL HYSTERECTOMY     ANKLE SURGERY     EYE SURGERY     Left hip replacement Left 2013   right hip replacement Right 2000   TONSILLECTOMY AND ADENOIDECTOMY  1943   TOTAL KNEE ARTHROPLASTY Right 02/23/2020   Procedure: TOTAL KNEE ARTHROPLASTY;  Surgeon: Gaynelle Arabian, MD;  Location: [facility];  Service: Orthopedics;  Laterality: Right;  54min   TOTAL KNEE ARTHROPLASTY Left 04/18/2021   Procedure: TOTAL KNEE ARTHROPLASTY;  Surgeon: Gaynelle Arabian, MD;  Location: [facility];  Service: Orthopedics;  Laterality: Left;   WISDOM TOOTH EXTRACTION      There were no vitals filed for this visit.   Subjective Assessment - 05/18/21 1607     Subjective Pt reports she has continued to have some soreness in the knee. No new concerns since last visit.    Patient is accompained by: Family member    Pertinent History Pt reports surgery on 02/16/21 on the Left knee. Reports knee pain of 4-5/10  and complains of mostly soreness. Pt has had history of bilateral THA and right TKA in addition to current TKA procedure. Pt reports she was provided with HEP but has not been able to copmlete to this point. Pt is taking 1 tramadol in the AM and at night and is taking asprin.    Limitations Standing;Walking;House hold activities    How long can you stand comfortably? 5- 8 min    How long can you walk comfortably? 5-8 min    Currently in Pain? Yes    Pain Score 4     Pain Location Knee    Pain Orientation Left    Pain Descriptors / Indicators Aching;Sore    Pain Onset In the past 7 days                Exercise/Activity Sets/Reps/Time/ Resistance Assistance Charge type Comments  Heel slides seated   15 x 5 sec holds   Therex  Towel under heel   SAQ with minA from PT  1 x 10 x 5 sec hold   Therex  - PT assist for end range knee extension  -decreased weight due to lack of terminal knee extension this session     LAQ/ knee flexion  X15 x 5 sec  hold ,   Therex -decreased weight due to lack of terminal knee extension this session   Nustep X 6 min level 1 setup Therex  For PROM on the right knee, unable to achieve full ROM on nustep this session       Assistance with end range overpressure on second set 84 degrees knee noted on last repetition   Supine quad sets  2 x 10 x 5 sec holds   Therex   Cues for proper muscle activation Towel under heel   Heel raises  2 x 10  Walker for balance  Therex  To improve ankle strength   Hurdle step over  2 x 10  B UE  TherACT To ipmrove ability to ambulate in various environments   Marching  X 15 ea   Therex  For practice of stepping and foot clearance   Knee flexion stretch at steps  Hamstring stretch at steps  2 x 30 sec  2 x 30 sec   Therex  For improving flexion ROM, cues for hold times.  -cues for hold times          ICE X4 min  No charge    Treatment Provided this session   Pt educated throughout session about proper posture and technique  with exercises. Improved exercise technique, movement at target joints, use of target muscles after min to mod verbal, visual, tactile cues.  Note: Portions of this document were prepared using Dragon voice recognition software and although reviewed may contain unintentional dictation errors in syntax, grammar, or spelling.                            PT Short Term Goals - 04/21/21 1047       PT SHORT TERM GOAL #1   Title Patient will be independent in home exercise program to improve strength/mobility for better functional independence with ADLs.    Baseline pt has initial HEP from hospital but not been able to complete to this point    Time 4    Period Weeks    Status New    Target Date 05/19/21      PT SHORT TERM GOAL #2   Title Patient will increase FOTO score to equal to or greater than  54   to demonstrate statistically significant improvement in mobility and quality of life.    Baseline FOTO 49 at IE,    Time 4    Period Weeks    Status New               PT Long Term Goals - 04/21/21 1207       PT LONG TERM GOAL #1   Title Pt will improve Left knee ROM to 0-120 in order to facilatate smooth gait pattern and stair navigation    Baseline 9-60 on IE    Time 12    Period Weeks    Status New    Target Date 07/14/21      PT LONG TERM GOAL #2   Title Patient (> 68 years old) will complete five times sit to stand test in < 15 seconds indicating an increased LE strength and improved balance.    Baseline 39.1    Time 12    Period Weeks    Status New    Target Date 07/14/21      PT LONG TERM GOAL #3   Title Patient will increase six minute walk test  distance to >1000 for progression to community ambulator and improve gait ability    Baseline unable to test at eval, ambulating with walker    Time 12    Period Weeks    Status New    Target Date 07/14/21      PT LONG TERM GOAL #4   Title Patient will increase 10 meter walk test to >1.54m/s as  to improve gait speed for better community ambulation and to reduce fall risk.    Baseline .73m/s 12/15 with RW    Time 12    Period Weeks    Status New    Target Date 07/14/21      PT LONG TERM GOAL #5   Title Patient will be require no assist with ascend/descend 3 steps with step over gait pattern to facilitate safe and effecient entry to home    Baseline step to gait pattern at home    Time 10    Period Weeks    Status New    Target Date 06/30/21                   Plan - 05/18/21 1609     Clinical Impression Statement Pt tolerated PT treatment session well. Pt displays hesitancy and need for reaching for objects when ambulaitng without AD at this time. Pt continued with stretching. Pt continuing to make progress with knee ROM but pt still has progress to make in order to have ROM and strength for functional knee flexion ROM. Pt scar assessed and is healing well.  Pt will continue to benefit from skilled PT intervention in order to improve her left knee ROM, strength, and to improve her overall mobility and QOL.    Personal Factors and Comorbidities Age    Examination-Activity Limitations Bed Mobility;Bend;Lift;Reach Overhead;Squat;Stairs;Stand    Examination-Participation Restrictions Driving;Community Activity;Meal Prep    Stability/Clinical Decision Making Stable/Uncomplicated    Rehab Potential Good    PT Frequency 2x / week    PT Duration 12 weeks    PT Treatment/Interventions ADLs/Self Care Home Management;Aquatic Therapy;Electrical Stimulation;Moist Heat;DME Instruction;Gait training;Stair training;Functional mobility training;Therapeutic activities;Therapeutic exercise;Balance training;Neuromuscular re-education;Manual techniques;Manual lymph drainage;Compression bandaging;Passive range of motion;Joint Manipulations;Energy conservation;Dry needling;Taping;Vasopneumatic Device    PT Next Visit Plan Progress HEP, manual stretching    PT Home Exercise Plan handount  provided, heel slides, quad sets, SLR, heel raises,    Consulted and Agree with Plan of Care Patient;Family member/caregiver             Patient will benefit from skilled therapeutic intervention in order to improve the following deficits and impairments:  Abnormal gait, Decreased activity tolerance, Decreased endurance, Decreased range of motion, Decreased strength, Improper body mechanics, Decreased balance, Difficulty walking, Decreased mobility  Visit Diagnosis: Abnormality of gait and mobility  Difficulty in walking, not elsewhere classified  Muscle weakness (generalized)  History of knee replacement, total, left     Problem List Patient Active Problem List   Diagnosis Date Noted   Primary osteoarthritis of left knee 04/18/2021   Weight loss 10/25/2020   Osteoporosis 07/07/2020   Primary osteoarthritis of right knee 02/23/2020   Lumbar radiculopathy 09/27/2018   Osteoarthritis of knee 08/26/2018   Degeneration of lumbar intervertebral disc 08/20/2018   Bilateral chronic knee pain 06/06/2018   Hyperlipidemia    GERD (gastroesophageal reflux disease)    Frequent headaches    Arthritis     Particia Lather, PT 05/18/2021, 5:29 PM  [facility] [facility] [place], [place], [address] Phone: [phone]   Fax:  [phone]  Name: Sherry Lamb MRN: [id number] Date of Birth: 10-Dec-1934

## 2021-05-20 ENCOUNTER — Ambulatory Visit: Payer: Medicare Other | Admitting: Physical Therapy

## 2021-05-20 ENCOUNTER — Other Ambulatory Visit: Payer: Self-pay

## 2021-05-20 DIAGNOSIS — R269 Unspecified abnormalities of gait and mobility: Secondary | ICD-10-CM | POA: Diagnosis not present

## 2021-05-20 DIAGNOSIS — R262 Difficulty in walking, not elsewhere classified: Secondary | ICD-10-CM

## 2021-05-20 DIAGNOSIS — M6281 Muscle weakness (generalized): Secondary | ICD-10-CM

## 2021-05-20 DIAGNOSIS — Z96652 Presence of left artificial knee joint: Secondary | ICD-10-CM

## 2021-05-20 NOTE — Therapy (Signed)
[place] [facility] SERVICES [address] [place], [place], [address] Phone: [phone]   Fax:  [phone]  Physical Therapy Treatment  Patient Details  Name: Sherry Lamb MRN: [id number] Date of Birth: April 30, 1935 Referring Provider (PT): Gaynelle Arabian   Encounter Date: 05/20/2021   PT End of Session - 05/20/21 1005     Visit Number 9    Number of Visits 24    Date for PT Re-Evaluation 07/14/21    Authorization Type [facility] [employer]    Progress Note Due on Visit 10    PT Start Time 1001    PT Stop Time 1045    PT Time Calculation (min) 44 min    Equipment Utilized During Treatment Gait belt    Activity Tolerance Patient limited by pain    Behavior During Therapy WFL for tasks assessed/performed             Past Medical History:  Diagnosis Date   Arthritis    Frequent headaches    GERD (gastroesophageal reflux disease)    Hyperlipidemia    Pneumonia    PONV (postoperative nausea and vomiting)     Past Surgical History:  Procedure Laterality Date   ABDOMINAL HYSTERECTOMY     ANKLE SURGERY     EYE SURGERY     Left hip replacement Left 2013   right hip replacement Right 2000   TONSILLECTOMY AND ADENOIDECTOMY  1943   TOTAL KNEE ARTHROPLASTY Right 02/23/2020   Procedure: TOTAL KNEE ARTHROPLASTY;  Surgeon: Gaynelle Arabian, MD;  Location: [facility];  Service: Orthopedics;  Laterality: Right;  50min   TOTAL KNEE ARTHROPLASTY Left 04/18/2021   Procedure: TOTAL KNEE ARTHROPLASTY;  Surgeon: Gaynelle Arabian, MD;  Location: [facility];  Service: Orthopedics;  Laterality: Left;   WISDOM TOOTH EXTRACTION      There were no vitals filed for this visit.   Subjective Assessment - 05/20/21 1003     Subjective Pt reports she has continued to have some soreness in the knee. No new concerns since last visit.    Patient is accompained by: Family member    Pertinent History Pt reports surgery on 02/16/21 on the Left knee. Reports knee pain of 4-5/10  and complains of mostly soreness. Pt has had history of bilateral THA and right TKA in addition to current TKA procedure. Pt reports she was provided with HEP but has not been able to copmlete to this point. Pt is taking 1 tramadol in the AM and at night and is taking asprin.    Limitations Standing;Walking;House hold activities    How long can you stand comfortably? 5- 8 min    How long can you walk comfortably? 5-8 min    Pain Onset In the past 7 days                Exercise/Activity Sets/Reps/Time/ Resistance Assistance Charge type Comments  Heel slides seated   15 x 5 sec holds   Therex  Towel under heel   SAQ  1 x 10 x 5 sec hold   Therex  - PT assist for end range knee extension  -decreased weight due to lack of terminal knee extension this session     LAQ/ knee flexion  X15 x 5 sec hold ,   Therex -decreased weight due to lack of terminal knee extension this session  -utilized contralateral LE for assistance with terminal range of motion  -cues for proper hold durations to prevent balistic form of  stretching   Nustep X 6 min level 1 setup Therex  For PROM on the right knee, unable to achieve full ROM on nustep this session   IASTm to hamstrings  Knee mobilizations grade 2/3 in knee flexion  X 4 min  X multiple minutes   Manual  To improve HS tightness and subjective discomfort in this area.  To improve knee flexion ROM   Supine quad sets  Supine heel slides  1 x 10 x 5 sec  1x10x 5 sec   Therex   Cues for proper muscle activation Towel under heel  Min A from PT for end range knee flexion. Last se measured at 89 degrees knee flexion   Heel raises  x30 BUE Therex  To improve ankle strength   Hurdle step over  2 x 10  U UE  TherACT To ipmrove ability to ambulate in various environments   Marching  X 15 ea  BUE Therex  For practice of stepping and foot clearance   Knee flexion stretch at steps  Hamstring stretch  2 x 30 sec  2 x 30 sec   Therex  For improving flexion ROM,  cues for hold times.  -cues for hold times   STS  X 10  BUE  Therex  For improved LE strength, cues for equal weightbearing   ICE X4 min  No charge  Performed in conjunction with L hamstring IASTM   Treatment Provided this session   Pt educated throughout session about proper posture and technique with exercises. Improved exercise technique, movement at target joints, use of target muscles after min to mod verbal, visual, tactile cues.  Note: Portions of this document were prepared using Dragon voice recognition software and although reviewed may contain unintentional dictation errors in syntax, grammar, or spelling.                           PT Short Term Goals - 04/21/21 1047       PT SHORT TERM GOAL #1   Title Patient will be independent in home exercise program to improve strength/mobility for better functional independence with ADLs.    Baseline pt has initial HEP from hospital but not been able to complete to this point    Time 4    Period Weeks    Status New    Target Date 05/19/21      PT SHORT TERM GOAL #2   Title Patient will increase FOTO score to equal to or greater than  54   to demonstrate statistically significant improvement in mobility and quality of life.    Baseline FOTO 49 at IE,    Time 4    Period Weeks    Status New               PT Long Term Goals - 04/21/21 1207       PT LONG TERM GOAL #1   Title Pt will improve Left knee ROM to 0-120 in order to facilatate smooth gait pattern and stair navigation    Baseline 9-60 on IE    Time 12    Period Weeks    Status New    Target Date 07/14/21      PT LONG TERM GOAL #2   Title Patient (> 85 years old) will complete five times sit to stand test in < 15 seconds indicating an increased LE strength and improved balance.    Baseline 39.1  Time 12    Period Weeks    Status New    Target Date 07/14/21      PT LONG TERM GOAL #3   Title Patient will increase six minute walk test  distance to >1000 for progression to community ambulator and improve gait ability    Baseline unable to test at eval, ambulating with walker    Time 12    Period Weeks    Status New    Target Date 07/14/21      PT LONG TERM GOAL #4   Title Patient will increase 10 meter walk test to >1.65m/s as to improve gait speed for better community ambulation and to reduce fall risk.    Baseline .100m/s 12/15 with RW    Time 12    Period Weeks    Status New    Target Date 07/14/21      PT LONG TERM GOAL #5   Title Patient will be require no assist with ascend/descend 3 steps with step over gait pattern to facilitate safe and effecient entry to home    Baseline step to gait pattern at home    Time 10    Period Weeks    Status New    Target Date 06/30/21                   Plan - 05/20/21 1005     Clinical Impression Statement Pt tolerated PT treatment session well. Pt displays hesitancy and need for reaching for objects when ambulaitng without AD at this time. Pt continued with stretching. Pt continuing to make progress with knee ROM but pt still has progress to make in order to have ROM and strength for functional knee flexion ROM. Pt scar assessed and is healing well. Pt will continue to benefit from skilled PT intervention in order to improve her left knee ROM, strength, and to improve her overall mobility and QOL.    Personal Factors and Comorbidities Age    Examination-Activity Limitations Bed Mobility;Bend;Lift;Reach Overhead;Squat;Stairs;Stand    Examination-Participation Restrictions Driving;Community Activity;Meal Prep    Stability/Clinical Decision Making Stable/Uncomplicated    Rehab Potential Good    PT Frequency 2x / week    PT Duration 12 weeks    PT Treatment/Interventions ADLs/Self Care Home Management;Aquatic Therapy;Electrical Stimulation;Moist Heat;DME Instruction;Gait training;Stair training;Functional mobility training;Therapeutic activities;Therapeutic  exercise;Balance training;Neuromuscular re-education;Manual techniques;Manual lymph drainage;Compression bandaging;Passive range of motion;Joint Manipulations;Energy conservation;Dry needling;Taping;Vasopneumatic Device    PT Next Visit Plan Progress HEP, manual stretching    PT Home Exercise Plan handount provided, heel slides, quad sets, SLR, heel raises,    Consulted and Agree with Plan of Care Patient;Family member/caregiver             Patient will benefit from skilled therapeutic intervention in order to improve the following deficits and impairments:  Abnormal gait, Decreased activity tolerance, Decreased endurance, Decreased range of motion, Decreased strength, Improper body mechanics, Decreased balance, Difficulty walking, Decreased mobility  Visit Diagnosis: Abnormality of gait and mobility  Difficulty in walking, not elsewhere classified  Muscle weakness (generalized)  History of knee replacement, total, left     Problem List Patient Active Problem List   Diagnosis Date Noted   Primary osteoarthritis of left knee 04/18/2021   Weight loss 10/25/2020   Osteoporosis 07/07/2020   Primary osteoarthritis of right knee 02/23/2020   Lumbar radiculopathy 09/27/2018   Osteoarthritis of knee 08/26/2018   Degeneration of lumbar intervertebral disc 08/20/2018   Bilateral chronic knee pain 06/06/2018  Hyperlipidemia    GERD (gastroesophageal reflux disease)    Frequent headaches    Arthritis     Particia Lather, PT 05/20/2021, 10:06 AM  [place] [facility] SERVICES [address] [place], [place], [address] Phone: [phone]   Fax:  [phone]  Name: Sherry Lamb MRN: [id number] Date of Birth: [birth year]

## 2021-05-23 ENCOUNTER — Ambulatory Visit: Payer: Medicare Other | Admitting: Physical Therapy

## 2021-05-23 ENCOUNTER — Encounter: Payer: Self-pay | Admitting: Physical Therapy

## 2021-05-23 ENCOUNTER — Other Ambulatory Visit: Payer: Self-pay

## 2021-05-23 DIAGNOSIS — M6281 Muscle weakness (generalized): Secondary | ICD-10-CM

## 2021-05-23 DIAGNOSIS — Z96652 Presence of left artificial knee joint: Secondary | ICD-10-CM

## 2021-05-23 DIAGNOSIS — R269 Unspecified abnormalities of gait and mobility: Secondary | ICD-10-CM | POA: Diagnosis not present

## 2021-05-23 DIAGNOSIS — R262 Difficulty in walking, not elsewhere classified: Secondary | ICD-10-CM

## 2021-05-23 DIAGNOSIS — M25562 Pain in left knee: Secondary | ICD-10-CM

## 2021-05-23 NOTE — Therapy (Signed)
[place] [facility] SERVICES [address] [place], [place], [address] Phone: [phone]   Fax:  [phone]  Physical Therapy Treatment Physical Therapy Progress Note   Dates of reporting period  04/21/21   to   05/23/21   Patient Details  Name: Sherry Lamb MRN: [id number] Date of Birth: 11-19-1934 Referring Provider (PT): Gaynelle Arabian   Encounter Date: 05/23/2021   PT End of Session - 05/23/21 1333     Visit Number 10    Number of Visits 24    Date for PT Re-Evaluation 07/14/21    Authorization Type [facility]    Progress Note Due on Visit 10    PT Start Time 0932    PT Stop Time 1016    PT Time Calculation (min) 44 min    Equipment Utilized During Treatment Gait belt    Activity Tolerance Patient limited by pain    Behavior During Therapy WFL for tasks assessed/performed             Past Medical History:  Diagnosis Date   Arthritis    Frequent headaches    GERD (gastroesophageal reflux disease)    Hyperlipidemia    Pneumonia    PONV (postoperative nausea and vomiting)     Past Surgical History:  Procedure Laterality Date   ABDOMINAL HYSTERECTOMY     ANKLE SURGERY     EYE SURGERY     Left hip replacement Left 2013   right hip replacement Right 2000   TONSILLECTOMY AND ADENOIDECTOMY  1943   TOTAL KNEE ARTHROPLASTY Right 02/23/2020   Procedure: TOTAL KNEE ARTHROPLASTY;  Surgeon: Gaynelle Arabian, MD;  Location: [facility];  Service: Orthopedics;  Laterality: Right;  39mn   TOTAL KNEE ARTHROPLASTY Left 04/18/2021   Procedure: TOTAL KNEE ARTHROPLASTY;  Surgeon: AGaynelle Arabian MD;  Location: [facility];  Service: Orthopedics;  Laterality: Left;   WISDOM TOOTH EXTRACTION      There were no vitals filed for this visit.   Subjective Assessment - 05/23/21 0939     Subjective Patient reports feeling like she has seen some improvement with less soreness and improved walking. She reports being able to walk short distances  without any assistive device. Denies any falls; She does report soreness in LLE posterior hip down back of leg to knee; She reports icing her knee often but states she didn't yesterday and is thinking that could be why its more sore.    Patient is accompained by: Family member    Pertinent History Pt reports surgery on 02/16/21 on the Left knee. Reports knee pain of 4-5/10 and complains of mostly soreness. Pt has had history of bilateral THA and right TKA in addition to current TKA procedure. Pt reports she was provided with HEP but has not been able to copmlete to this point. Pt is taking 1 tramadol in the AM and at night and is taking asprin.    Limitations Standing;Walking;House hold activities    How long can you stand comfortably? 5- 8 min    How long can you walk comfortably? 5-8 min    Currently in Pain? Yes    Pain Score 4     Pain Location Knee    Pain Orientation Left    Pain Descriptors / Indicators Aching;Sore    Pain Type Surgical pain    Pain Onset In the past 7 days    Pain Frequency Intermittent    Aggravating Factors  worse with movement/activity  Pain Relieving Factors pain meds/rest    Effect of Pain on Daily Activities decreased activity tolerance;    Multiple Pain Sites No                [facility] PT Assessment - 05/23/21 0001       Observation/Other Assessments   Focus on Therapeutic Outcomes (FOTO)  49% (no change from initial eval on 04/21/21)      AROM   Overall AROM Comments LLE knee in supine 5-85      6 minute walk test results    Aerobic Endurance Distance Walked 470    Endurance additional comments with SPC, close supervision, reciprocal gait pattern, slower gait speed, less than community ambulation      Standardized Balance Assessment   Five times sit to stand comments  20.15 s with BUE arms on arm rests, improved from 39.1 on 04/21/21    10 Meter Walk 0.51 m/s without AD, close supervision, limited home ambulator, improved from 0.39 m/s with RW  on 04/21/21              TREATMENT: Warm up on Nustep BUE/BLE level 1 x5 min during history intake and questions related to HEP adherence;Applied moist heat to low back/hip concurrent with exercise for better tolerance; Patient reports slight reduction in soreness with heat pack;  PT instructed patient in outcome measures to address progress towards goals, see above; She continues to require UE to push up on chair as she is unable to transfer without UE assist with heavy lean to RLE due to LLE pain and weakness;  Patient is able to walk short distances without AD (see 10 meter walk) but does use SPC when walking longer distances;  Supine: LLE heel slides AROM x10 reps to tolerance;  LLE knee AROM was done with minimal stretches to address carryover between sessions;   Patient's condition has the potential to improve in response to therapy. Maximum improvement is yet to be obtained. The anticipated improvement is attainable and reasonable in a generally predictable time.  Patient reports adherence with HEP. She is anxious to improve her ROM/strength and return to PLOF.                    PT Education - 05/23/21 1333     Education Details progress towards goals;    Person(s) Educated Patient    Methods Explanation;Verbal cues    Comprehension Verbalized understanding;Returned demonstration;Verbal cues required;Need further instruction              PT Short Term Goals - 05/23/21 0942       PT SHORT TERM GOAL #1   Title Patient will be independent in home exercise program to improve strength/mobility for better functional independence with ADLs.    Baseline pt has initial HEP from hospital but not been able to complete to this point; 1/16: reports doing HEP scattered, doing stretches with walker, intermittent adherence; reports being independent in exercise;    Time 4    Period Weeks    Status Partially Met    Target Date 05/19/21      PT SHORT TERM GOAL #2    Title Patient will increase FOTO score to equal to or greater than  54   to demonstrate statistically significant improvement in mobility and quality of life.    Baseline FOTO 49 at IE,    Time 4    Period Weeks    Status New  PT Long Term Goals - 05/23/21 0944       PT LONG TERM GOAL #1   Title Pt will improve Left knee ROM to 0-120 in order to facilatate smooth gait pattern and stair navigation    Baseline 9-60 on IE, 1/16: 5-85 degrees    Time 12    Period Weeks    Status Partially Met    Target Date 07/14/21      PT LONG TERM GOAL #2   Title Patient (> 63 years old) will complete five times sit to stand test in < 15 seconds indicating an increased LE strength and improved balance.    Baseline 39.1, 1/16: 20.15 sec with BUE on arm rests    Time 12    Period Weeks    Status New    Target Date 07/14/21      PT LONG TERM GOAL #3   Title Patient will increase six minute walk test distance to >1000 for progression to community ambulator and improve gait ability    Baseline unable to test at eval, ambulating with walker; 1/16: 470 feet with SPC    Time 12    Period Weeks    Status Partially Met    Target Date 07/14/21      PT LONG TERM GOAL #4   Title Patient will increase 10 meter walk test to >1.41ms as to improve gait speed for better community ambulation and to reduce fall risk.    Baseline .322m 12/15 with RW, 1/16: 0.51 m/s without AD    Time 12    Period Weeks    Status New    Target Date 07/14/21      PT LONG TERM GOAL #5   Title Patient will be require no assist with ascend/descend 3 steps with step over gait pattern to facilitate safe and effecient entry to home    Baseline step to gait pattern at home    Time 10    Period Weeks    Status New    Target Date 06/30/21                   Plan - 05/23/21 1334     Clinical Impression Statement Patient motivated and participated well within session. She does report increased  soreness in LLE posterior hip and down leg to knee at start of session. She reports feeling like her pain is worse today likely from not icing her knee yesterday. PT instructed patient in outcome measures to address progress towards goals. She does exhibit significant improvement in gait speed, gait ability as well as transfer ability. She is able to walk short distances without AD although still has increased fear of falling. She will use SPC when walking longer distances or when outside of home. She denies any recent falls. She is walking at slower gait speed and is a limited community ambulator. While her knee ROM has improved it is still significantly limited. She requires UE assist to push up on chair for transfers. Patient would benefit from skilled PT Intervention to improve ROM, strength and mobility;    Personal Factors and Comorbidities Age    Examination-Activity Limitations Bed Mobility;Bend;Lift;Reach Overhead;Squat;Stairs;Stand    Examination-Participation Restrictions Driving;Community Activity;Meal Prep    Stability/Clinical Decision Making Stable/Uncomplicated    Rehab Potential Good    PT Frequency 2x / week    PT Duration 12 weeks    PT Treatment/Interventions ADLs/Self Care Home Management;Aquatic Therapy;Electrical Stimulation;Moist Heat;DME Instruction;Gait training;Stair training;Functional mobility training;Therapeutic activities;Therapeutic  exercise;Balance training;Neuromuscular re-education;Manual techniques;Manual lymph drainage;Compression bandaging;Passive range of motion;Joint Manipulations;Energy conservation;Dry needling;Taping;Vasopneumatic Device    PT Next Visit Plan Progress HEP, manual stretching    PT Home Exercise Plan handount provided, heel slides, quad sets, SLR, heel raises,    Consulted and Agree with Plan of Care Patient;Family member/caregiver             Patient will benefit from skilled therapeutic intervention in order to improve the following  deficits and impairments:  Abnormal gait, Decreased activity tolerance, Decreased endurance, Decreased range of motion, Decreased strength, Improper body mechanics, Decreased balance, Difficulty walking, Decreased mobility  Visit Diagnosis: Abnormality of gait and mobility  Difficulty in walking, not elsewhere classified  Muscle weakness (generalized)  History of knee replacement, total, left  Acute pain of left knee  Status post total left knee replacement     Problem List Patient Active Problem List   Diagnosis Date Noted   Primary osteoarthritis of left knee 04/18/2021   Weight loss 10/25/2020   Osteoporosis 07/07/2020   Primary osteoarthritis of right knee 02/23/2020   Lumbar radiculopathy 09/27/2018   Osteoarthritis of knee 08/26/2018   Degeneration of lumbar intervertebral disc 08/20/2018   Bilateral chronic knee pain 06/06/2018   Hyperlipidemia    GERD (gastroesophageal reflux disease)    Frequent headaches    Arthritis     Jacee Enerson, PT, DPT 05/23/2021, 1:39 PM  [place], [place], [address] Phone: [phone]   Fax:  [phone]  Name: Sherry Lamb MRN: [id number] Date of Birth: [birth year]

## 2021-05-24 ENCOUNTER — Encounter: Payer: Medicare Other | Admitting: Physical Therapy

## 2021-05-27 ENCOUNTER — Other Ambulatory Visit: Payer: Self-pay

## 2021-05-27 ENCOUNTER — Ambulatory Visit: Payer: Medicare Other | Admitting: Physical Therapy

## 2021-05-27 DIAGNOSIS — M6281 Muscle weakness (generalized): Secondary | ICD-10-CM

## 2021-05-27 DIAGNOSIS — R269 Unspecified abnormalities of gait and mobility: Secondary | ICD-10-CM

## 2021-05-27 DIAGNOSIS — Z96652 Presence of left artificial knee joint: Secondary | ICD-10-CM

## 2021-05-27 DIAGNOSIS — R262 Difficulty in walking, not elsewhere classified: Secondary | ICD-10-CM

## 2021-05-27 NOTE — Therapy (Signed)
[place] [facility] SERVICES [address] [place], [place], [address] Phone: [phone]   Fax:  [phone]  Physical Therapy Treatment  Patient Details  Name: Sherry Lamb MRN: [id number] Date of Birth: 1935/04/07 Referring Provider (PT): Gaynelle Arabian   Encounter Date: 05/27/2021   PT End of Session - 05/27/21 0955     Visit Number 11    Number of Visits 24    Date for PT Re-Evaluation 07/14/21    Authorization Type [facility] [employer]    Progress Note Due on Visit 10    PT Start Time 0950    PT Stop Time 1035    PT Time Calculation (min) 45 min    Equipment Utilized During Treatment Gait belt    Activity Tolerance Patient limited by pain    Behavior During Therapy WFL for tasks assessed/performed             Past Medical History:  Diagnosis Date   Arthritis    Frequent headaches    GERD (gastroesophageal reflux disease)    Hyperlipidemia    Pneumonia    PONV (postoperative nausea and vomiting)     Past Surgical History:  Procedure Laterality Date   ABDOMINAL HYSTERECTOMY     ANKLE SURGERY     EYE SURGERY     Left hip replacement Left 2013   right hip replacement Right 2000   TONSILLECTOMY AND ADENOIDECTOMY  1943   TOTAL KNEE ARTHROPLASTY Right 02/23/2020   Procedure: TOTAL KNEE ARTHROPLASTY;  Surgeon: Gaynelle Arabian, MD;  Location: [facility];  Service: Orthopedics;  Laterality: Right;  51mn   TOTAL KNEE ARTHROPLASTY Left 04/18/2021   Procedure: TOTAL KNEE ARTHROPLASTY;  Surgeon: AGaynelle Arabian MD;  Location: [facility];  Service: Orthopedics;  Laterality: Left;   WISDOM TOOTH EXTRACTION      There were no vitals filed for this visit.   Subjective Assessment - 05/27/21 0953     Subjective Pt reports taking tylenol prior to coming today, but not having significant pain at this time. No other significant changes since last session.    Patient is accompained by: Family member    Pertinent History Pt reports surgery on 02/16/21  on the Left knee. Reports knee pain of 4-5/10 and complains of mostly soreness. Pt has had history of bilateral THA and right TKA in addition to current TKA procedure. Pt reports she was provided with HEP but has not been able to copmlete to this point. Pt is taking 1 tramadol in the AM and at night and is taking asprin.    Limitations Standing;Walking;House hold activities    How long can you stand comfortably? 5- 8 min    How long can you walk comfortably? 5-8 min    Currently in Pain? Yes    Pain Score 3     Pain Location Knee    Pain Orientation Left    Pain Descriptors / Indicators Aching;Sore    Pain Onset 1 to 4 weeks ago             Exercise/Activity Sets/Reps/Time/ Resistance Assistance Charge type Comments  Step up X 10 BUE TherACT Cues for less UE propulsion  Squat practice in // bars  X multiple reps  B UE TherACT Various cues to improve hip hinge and prevent knees over toes squatting strategy- mirror, feedback, verbal internal and external focus cues         Nustep X 6 min level 2 setup Therex  For PROM on  the right knee, able to achieve full ROM at seat level 8 this session  End range extension overpressure Knee mobilizations grade 2/3 in knee flexion  Manual HS stretch in supine position  X 5 min X multiple minutes 3x 30 sec    Manual  To improve HS tightness and subjective discomfort in this area.  To improve knee flexion ROM    Supine heel slides   1 x 10 x 5 sec   Therex   Cues for proper muscle activation Towel under heel  Min A from PT for end range knee flexion. Last se measured at 89 degrees knee flexion   Heel raises  2x15 BUE Therex  To improve ankle strength         Marching  X 15 ea  BUE Therex  For practice of stepping and foot clearance   Knee flexion stretch at steps  Hamstring stretch  2 x 30 sec  2 x 30 sec   Therex  For improving flexion ROM, cues for hold times.  -cues for hold times   STS  X 10  BUE  Therex  From adjustable treatment table,  cues for forward lean and positioing, CGA to prevent quick return to seated position upon LOB.    ICE X4 min  No charge  Performed in conjunction with L hamstring IASTM   Treatment Provided this session   Pt educated throughout session about proper posture and technique with exercises. Improved exercise technique, movement at target joints, use of target muscles after min to mod verbal, visual, tactile cues.  Note: Portions of this document were prepared using Dragon voice recognition software and although reviewed may contain unintentional dictation errors in syntax, grammar, or spelling.                           PT Education - 05/27/21 0955     Education Details Progress toward goals    Person(s) Educated Patient    Methods Explanation    Comprehension Verbalized understanding;Returned demonstration              PT Short Term Goals - 05/23/21 0942       PT SHORT TERM GOAL #1   Title Patient will be independent in home exercise program to improve strength/mobility for better functional independence with ADLs.    Baseline pt has initial HEP from hospital but not been able to complete to this point; 1/16: reports doing HEP scattered, doing stretches with walker, intermittent adherence; reports being independent in exercise;    Time 4    Period Weeks    Status Partially Met    Target Date 05/19/21      PT SHORT TERM GOAL #2   Title Patient will increase FOTO score to equal to or greater than  54   to demonstrate statistically significant improvement in mobility and quality of life.    Baseline FOTO 49 at IE,    Time 4    Period Weeks    Status New               PT Long Term Goals - 05/23/21 0944       PT LONG TERM GOAL #1   Title Pt will improve Left knee ROM to 0-120 in order to facilatate smooth gait pattern and stair navigation    Baseline 9-60 on IE, 1/16: 5-85 degrees    Time 12    Period Weeks  Status Partially Met    Target Date  07/14/21      PT LONG TERM GOAL #2   Title Patient (> 2 years old) will complete five times sit to stand test in < 15 seconds indicating an increased LE strength and improved balance.    Baseline 39.1, 1/16: 20.15 sec with BUE on arm rests    Time 12    Period Weeks    Status New    Target Date 07/14/21      PT LONG TERM GOAL #3   Title Patient will increase six minute walk test distance to >1000 for progression to community ambulator and improve gait ability    Baseline unable to test at eval, ambulating with walker; 1/16: 470 feet with SPC    Time 12    Period Weeks    Status Partially Met    Target Date 07/14/21      PT LONG TERM GOAL #4   Title Patient will increase 10 meter walk test to >1.62ms as to improve gait speed for better community ambulation and to reduce fall risk.    Baseline .362m 12/15 with RW, 1/16: 0.51 m/s without AD    Time 12    Period Weeks    Status New    Target Date 07/14/21      PT LONG TERM GOAL #5   Title Patient will be require no assist with ascend/descend 3 steps with step over gait pattern to facilitate safe and effecient entry to home    Baseline step to gait pattern at home    Time 10    Period Weeks    Status New    Target Date 06/30/21                   Plan - 05/27/21 1134     Clinical Impression Statement Pt motivated and partiipated well within session. Pt continuing to make progress with range of motion following stretching, manual and therex interventions to improve motion. Pt began with training for squat form this session and had some difficulty reqyuiring multimodal cuing to imrpove squat form for functional activities such as putting away dishes and other household chores. Pt will continue to benefit from skilled PT intervention in order to imrove LE strength, Left knee ROM and overall QOL and function.    Personal Factors and Comorbidities Age    Examination-Activity Limitations Bed Mobility;Bend;Lift;Reach  Overhead;Squat;Stairs;Stand    Examination-Participation Restrictions Driving;Community Activity;Meal Prep    Stability/Clinical Decision Making Stable/Uncomplicated    Rehab Potential Good    PT Frequency 2x / week    PT Duration 12 weeks    PT Treatment/Interventions ADLs/Self Care Home Management;Aquatic Therapy;Electrical Stimulation;Moist Heat;DME Instruction;Gait training;Stair training;Functional mobility training;Therapeutic activities;Therapeutic exercise;Balance training;Neuromuscular re-education;Manual techniques;Manual lymph drainage;Compression bandaging;Passive range of motion;Joint Manipulations;Energy conservation;Dry needling;Taping;Vasopneumatic Device    PT Next Visit Plan Progress HEP, manual stretching    PT Home Exercise Plan handount provided, heel slides, quad sets, SLR, heel raises,    Consulted and Agree with Plan of Care Patient;Family member/caregiver             Patient will benefit from skilled therapeutic intervention in order to improve the following deficits and impairments:  Abnormal gait, Decreased activity tolerance, Decreased endurance, Decreased range of motion, Decreased strength, Improper body mechanics, Decreased balance, Difficulty walking, Decreased mobility  Visit Diagnosis: Abnormality of gait and mobility  Difficulty in walking, not elsewhere classified  Muscle weakness (generalized)  History of knee replacement, total, left  Problem List Patient Active Problem List   Diagnosis Date Noted   Primary osteoarthritis of left knee 04/18/2021   Weight loss 10/25/2020   Osteoporosis 07/07/2020   Primary osteoarthritis of right knee 02/23/2020   Lumbar radiculopathy 09/27/2018   Osteoarthritis of knee 08/26/2018   Degeneration of lumbar intervertebral disc 08/20/2018   Bilateral chronic knee pain 06/06/2018   Hyperlipidemia    GERD (gastroesophageal reflux disease)    Frequent headaches    Arthritis     Particia Lather,  PT 05/27/2021, 11:37 AM  [place] MAIN [facility] SERVICES [address] [place], [place], [address] Phone: [phone]   Fax:  [phone]  Name: Sherry Lamb MRN: [id number] Date of Birth: 1935-01-16

## 2021-05-31 ENCOUNTER — Other Ambulatory Visit: Payer: Self-pay

## 2021-05-31 ENCOUNTER — Ambulatory Visit: Payer: Medicare Other | Admitting: Physical Therapy

## 2021-05-31 DIAGNOSIS — R262 Difficulty in walking, not elsewhere classified: Secondary | ICD-10-CM

## 2021-05-31 DIAGNOSIS — M6281 Muscle weakness (generalized): Secondary | ICD-10-CM

## 2021-05-31 DIAGNOSIS — R269 Unspecified abnormalities of gait and mobility: Secondary | ICD-10-CM | POA: Diagnosis not present

## 2021-05-31 DIAGNOSIS — Z96652 Presence of left artificial knee joint: Secondary | ICD-10-CM

## 2021-05-31 NOTE — Therapy (Signed)
[place] MAIN [facility] SERVICES [address] [place], [place], [address] Phone: [phone]   Fax:  [phone]  Physical Therapy Treatment  Patient Details  Name: Sherry Lamb MRN: [id number] Date of Birth: 09-07-1934 Referring Provider (PT): Gaynelle Arabian   Encounter Date: 05/31/2021   PT End of Session - 05/31/21 1558     Visit Number 12    Number of Visits 24    Date for PT Re-Evaluation 07/14/21    Authorization Type [facility]    Progress Note Due on Visit 10    PT Start Time 6503    PT Stop Time 1640    PT Time Calculation (min) 45 min    Equipment Utilized During Treatment Gait belt    Activity Tolerance Patient limited by pain    Behavior During Therapy WFL for tasks assessed/performed             Past Medical History:  Diagnosis Date   Arthritis    Frequent headaches    GERD (gastroesophageal reflux disease)    Hyperlipidemia    Pneumonia    PONV (postoperative nausea and vomiting)     Past Surgical History:  Procedure Laterality Date   ABDOMINAL HYSTERECTOMY     ANKLE SURGERY     EYE SURGERY     Left hip replacement Left 2013   right hip replacement Right 2000   TONSILLECTOMY AND ADENOIDECTOMY  1943   TOTAL KNEE ARTHROPLASTY Right 02/23/2020   Procedure: TOTAL KNEE ARTHROPLASTY;  Surgeon: Gaynelle Arabian, MD;  Location: [facility];  Service: Orthopedics;  Laterality: Right;  54min   TOTAL KNEE ARTHROPLASTY Left 04/18/2021   Procedure: TOTAL KNEE ARTHROPLASTY;  Surgeon: Gaynelle Arabian, MD;  Location: [facility];  Service: Orthopedics;  Laterality: Left;   WISDOM TOOTH EXTRACTION      There were no vitals filed for this visit.   Subjective Assessment - 05/31/21 1559     Subjective Pt reports having some low back pain that has been bothering her while she attempts to rest. Pt reports knee pain has not been too bad and she has been able to move it a little better with knee flexion exercises.    Patient is accompained by:  Family member    Pertinent History Pt reports surgery on 02/16/21 on the Left knee. Reports knee pain of 4-5/10 and complains of mostly soreness. Pt has had history of bilateral THA and right TKA in addition to current TKA procedure. Pt reports she was provided with HEP but has not been able to copmlete to this point. Pt is taking 1 tramadol in the AM and at night and is taking asprin.    Limitations Standing;Walking;House hold activities    How long can you stand comfortably? 5- 8 min    How long can you walk comfortably? 5-8 min    Currently in Pain? Yes    Pain Score 4     Pain Location Knee    Pain Orientation Left    Pain Descriptors / Indicators Aching;Sore    Pain Type Surgical pain    Pain Onset 1 to 4 weeks ago    Pain Frequency Intermittent               Exercise/Activity Sets/Reps/Time/ Resistance Assistance Charge type Comments  Step up X 10 BUE TherACT Cues for less UE propulsion  Squat practice in // bars  X multiple reps  B UE TherACT Various cues to improve hip hinge and prevent  knees over toes squatting strategy- mirror, feedback, verbal internal and external focus cues   Step up and step down on 6 in step  X 8 ea  B UE  therACT Increased difficulty with step down with cues to prevent hip rotation to accomplish task  Nustep - UE and LE X 6 min level 2 setup Therex  For PROM/ARM on the right knee, able to achieve full ROM at seat level 8 this session  End range extension overpressure Knee mobilizations grade 2/3 in various degrees of knee flexion  Manual STM  and ischemic TP release to rectus femoris and vastus lateralis on the left side X 5 min X multiple minutes X Multiple minutes of STM and ischemic TP release   Manual  To improve HS tightness and subjective discomfort in this area.  To improve knee flexion ROM  Significant TTP areas in rectus femoris distally and in vastus lateralis    Supine heel slides   1 x 10 x 5 sec   Therex   Cues for proper muscle  activation Towel under heel  Min A from PT for end range knee flexion. Last se measured at 89 degrees knee flexion   Heel raises  2x15 BUE Therex  To improve ankle strength and gastroc strength         Marching  X 15 ea  BUE Therex  For practice of stepping and foot clearance   Knee flexion stretch at steps  Hamstring stretch @ steps  2 x 30 sec  2 x 30 sec   Therex  For improving flexion ROM, cues for hold times.  -cues for hold times   STS  2 X 10  BUE  Therex  From adjustable treatment table, cues for forward lean and positioing, cues for eccentric control of motion  -cues for weight shift anteriorly (pt has tendency to keep weight through heels and lose balance posteriorly.    ICE X4 min  No charge    Treatment Provided this session   Pt educated throughout session about proper posture and technique with exercises. Improved exercise technique, movement at target joints, use of target muscles after min to mod verbal, visual, tactile cues.  Note: Portions of this document were prepared using Dragon voice recognition software and although reviewed may contain unintentional dictation errors in syntax, grammar, or spelling.                             PT Short Term Goals - 05/23/21 3817       PT SHORT TERM GOAL #1   Title Patient will be independent in home exercise program to improve strength/mobility for better functional independence with ADLs.    Baseline pt has initial HEP from hospital but not been able to complete to this point; 1/16: reports doing HEP scattered, doing stretches with walker, intermittent adherence; reports being independent in exercise;    Time 4    Period Weeks    Status Partially Met    Target Date 05/19/21      PT SHORT TERM GOAL #2   Title Patient will increase FOTO score to equal to or greater than  54   to demonstrate statistically significant improvement in mobility and quality of life.    Baseline FOTO 49 at IE,    Time 4     Period Weeks    Status New  PT Long Term Goals - 05/23/21 0944       PT LONG TERM GOAL #1   Title Pt will improve Left knee ROM to 0-120 in order to facilatate smooth gait pattern and stair navigation    Baseline 9-60 on IE, 1/16: 5-85 degrees    Time 12    Period Weeks    Status Partially Met    Target Date 07/14/21      PT LONG TERM GOAL #2   Title Patient (> 5 years old) will complete five times sit to stand test in < 15 seconds indicating an increased LE strength and improved balance.    Baseline 39.1, 1/16: 20.15 sec with BUE on arm rests    Time 12    Period Weeks    Status New    Target Date 07/14/21      PT LONG TERM GOAL #3   Title Patient will increase six minute walk test distance to >1000 for progression to community ambulator and improve gait ability    Baseline unable to test at eval, ambulating with walker; 1/16: 470 feet with SPC    Time 12    Period Weeks    Status Partially Met    Target Date 07/14/21      PT LONG TERM GOAL #4   Title Patient will increase 10 meter walk test to >1.61ms as to improve gait speed for better community ambulation and to reduce fall risk.    Baseline .361m 12/15 with RW, 1/16: 0.51 m/s without AD    Time 12    Period Weeks    Status New    Target Date 07/14/21      PT LONG TERM GOAL #5   Title Patient will be require no assist with ascend/descend 3 steps with step over gait pattern to facilitate safe and effecient entry to home    Baseline step to gait pattern at home    Time 10    Period Weeks    Status New    Target Date 06/30/21                   Plan - 05/31/21 1644     Clinical Impression Statement Pt motivated and partipated well within session. Pt continuing to make progress with range of motion following stretching, manual and therex interventions to improve motion. Pt demonstrated progress with sit to stand activity today and demonstrated ipmroved form and ease with this task this  date. Also progressed with step up and step down activity and pt had good form but did require B UE support in order to perform safely.   Pt will continue to benefit from skilled PT intervention in order to imrove LE strength, Left knee ROM and overall QOL and function.    Personal Factors and Comorbidities Age    Examination-Activity Limitations Bed Mobility;Bend;Lift;Reach Overhead;Squat;Stairs;Stand    Examination-Participation Restrictions Driving;Community Activity;Meal Prep    Stability/Clinical Decision Making Stable/Uncomplicated    Rehab Potential Good    PT Frequency 2x / week    PT Duration 12 weeks    PT Treatment/Interventions ADLs/Self Care Home Management;Aquatic Therapy;Electrical Stimulation;Moist Heat;DME Instruction;Gait training;Stair training;Functional mobility training;Therapeutic activities;Therapeutic exercise;Balance training;Neuromuscular re-education;Manual techniques;Manual lymph drainage;Compression bandaging;Passive range of motion;Joint Manipulations;Energy conservation;Dry needling;Taping;Vasopneumatic Device    PT Next Visit Plan Progress HEP, manual, stretching, balance, functional activities where appropriate    PT Home Exercise Plan handount provided, heel slides, quad sets, SLR, heel raises,    Consulted and Agree with Plan  of Care Patient;Family member/caregiver             Patient will benefit from skilled therapeutic intervention in order to improve the following deficits and impairments:  Abnormal gait, Decreased activity tolerance, Decreased endurance, Decreased range of motion, Decreased strength, Improper body mechanics, Decreased balance, Difficulty walking, Decreased mobility  Visit Diagnosis: Abnormality of gait and mobility  Difficulty in walking, not elsewhere classified  Muscle weakness (generalized)  History of knee replacement, total, left     Problem List Patient Active Problem List   Diagnosis Date Noted   Primary  osteoarthritis of left knee 04/18/2021   Weight loss 10/25/2020   Osteoporosis 07/07/2020   Primary osteoarthritis of right knee 02/23/2020   Lumbar radiculopathy 09/27/2018   Osteoarthritis of knee 08/26/2018   Degeneration of lumbar intervertebral disc 08/20/2018   Bilateral chronic knee pain 06/06/2018   Hyperlipidemia    GERD (gastroesophageal reflux disease)    Frequent headaches    Arthritis     Particia Lather, PT 05/31/2021, 4:53 PM  [place] [address] [place], [place], [address] Phone: [phone]   Fax:  [phone]  Name: Reegan Mctighe MRN: [id number] Date of Birth: July 14, 1934

## 2021-06-03 ENCOUNTER — Other Ambulatory Visit: Payer: Self-pay

## 2021-06-03 ENCOUNTER — Ambulatory Visit: Payer: Medicare Other | Admitting: Physical Therapy

## 2021-06-03 DIAGNOSIS — Z96652 Presence of left artificial knee joint: Secondary | ICD-10-CM

## 2021-06-03 DIAGNOSIS — M6281 Muscle weakness (generalized): Secondary | ICD-10-CM

## 2021-06-03 DIAGNOSIS — R262 Difficulty in walking, not elsewhere classified: Secondary | ICD-10-CM

## 2021-06-03 DIAGNOSIS — R269 Unspecified abnormalities of gait and mobility: Secondary | ICD-10-CM | POA: Diagnosis not present

## 2021-06-03 NOTE — Therapy (Signed)
[place] MAIN [facility] SERVICES [address] [place], [place], [address] Phone: [phone]   Fax:  [phone]  Physical Therapy Treatment  Patient Details  Name: Sherry Lamb MRN: [id number] Date of Birth: February 25, 1935 Referring Provider (PT): Gaynelle Arabian   Encounter Date: 06/03/2021   PT End of Session - 06/03/21 1006     Visit Number 13    Number of Visits 24    Date for PT Re-Evaluation 07/14/21    Authorization Type [facility] [employer]    Progress Note Due on Visit 10    PT Start Time 1000    PT Stop Time 1044    PT Time Calculation (min) 44 min    Equipment Utilized During Treatment Gait belt    Activity Tolerance Patient limited by pain    Behavior During Therapy WFL for tasks assessed/performed             Past Medical History:  Diagnosis Date   Arthritis    Frequent headaches    GERD (gastroesophageal reflux disease)    Hyperlipidemia    Pneumonia    PONV (postoperative nausea and vomiting)     Past Surgical History:  Procedure Laterality Date   ABDOMINAL HYSTERECTOMY     ANKLE SURGERY     EYE SURGERY     Left hip replacement Left 2013   right hip replacement Right 2000   TONSILLECTOMY AND ADENOIDECTOMY  1943   TOTAL KNEE ARTHROPLASTY Right 02/23/2020   Procedure: TOTAL KNEE ARTHROPLASTY;  Surgeon: Gaynelle Arabian, MD;  Location: [facility];  Service: Orthopedics;  Laterality: Right;  62mn   TOTAL KNEE ARTHROPLASTY Left 04/18/2021   Procedure: TOTAL KNEE ARTHROPLASTY;  Surgeon: AGaynelle Arabian MD;  Location: [facility];  Service: Orthopedics;  Laterality: Left;   WISDOM TOOTH EXTRACTION      There were no vitals filed for this visit.   Subjective Assessment - 06/03/21 1005     Subjective Pt reports having some low back pain that has been bothering her while she attempts to rest. Pt reports knee soreness following therapy.    Patient is accompained by: Family member    Pertinent History Pt reports surgery on 02/16/21 on the  Left knee. Reports knee pain of 4-5/10 and complains of mostly soreness. Pt has had history of bilateral THA and right TKA in addition to current TKA procedure. Pt reports she was provided with HEP but has not been able to copmlete to this point. Pt is taking 1 tramadol in the AM and at night and is taking asprin.    Limitations Standing;Walking;House hold activities    How long can you stand comfortably? 5- 8 min    How long can you walk comfortably? 5-8 min    Pain Onset 1 to 4 weeks ago               Exercise/Activity Sets/Reps/Time/ Resistance Assistance Charge type Comments  Step up X 10 BUE TherACT Cues for less UE propulsion  Manual HS, piriformis and hip stretching  X  multiple minutes   Manual Significant hip tightness, pt reports improved hip and low back pain following.         Nustep - UE and LE X 6 min level 2 setup Therex  For PROM/ARM on the right knee, able to achieve full ROM at seat level 8 this session         Supine heel slides   1 x 10 x 5 sec   Therex  Cues for proper muscle activation Towel under heel  Min A from PT for end range knee flexion. Last set measured at 98 degrees knee flexion         Biodex gait trainer    TherACT Biodex TM- gait trainer using UE support and gait belt-  Step length= __.37 m RLE_.41_ m L Time on each foot _50_%R/_50_%L Gait velocity= .48 m/s Total Time on TM = 5 min          Knee flexion stretch at steps  Hamstring stretch @ steps  2 x 45 sec  2 x 45 sec   Therex  For improving transitions from various surfaces  -   STS  2 X 10  BUE  Theract From adjustable treatment table, cues for forward lean and positioing, cues for eccentric control of motion  -cues for weight shift anteriorly (pt has tendency to keep weight through heels and lose balance posteriorly.    ICE X4 min  No charge    Treatment Provided this session   Pt educated throughout session about proper posture and technique with exercises. Improved exercise  technique, movement at target joints, use of target muscles after min to mod verbal, visual, tactile cues.  Note: Portions of this document were prepared using Dragon voice recognition software and although reviewed may contain unintentional dictation errors in syntax, grammar, or spelling.                             PT Short Term Goals - 05/23/21 2423       PT SHORT TERM GOAL #1   Title Patient will be independent in home exercise program to improve strength/mobility for better functional independence with ADLs.    Baseline pt has initial HEP from hospital but not been able to complete to this point; 1/16: reports doing HEP scattered, doing stretches with walker, intermittent adherence; reports being independent in exercise;    Time 4    Period Weeks    Status Partially Met    Target Date 05/19/21      PT SHORT TERM GOAL #2   Title Patient will increase FOTO score to equal to or greater than  54   to demonstrate statistically significant improvement in mobility and quality of life.    Baseline FOTO 49 at IE,    Time 4    Period Weeks    Status New               PT Long Term Goals - 05/23/21 0944       PT LONG TERM GOAL #1   Title Pt will improve Left knee ROM to 0-120 in order to facilatate smooth gait pattern and stair navigation    Baseline 9-60 on IE, 1/16: 5-85 degrees    Time 12    Period Weeks    Status Partially Met    Target Date 07/14/21      PT LONG TERM GOAL #2   Title Patient (> 60 years old) will complete five times sit to stand test in < 15 seconds indicating an increased LE strength and improved balance.    Baseline 39.1, 1/16: 20.15 sec with BUE on arm rests    Time 12    Period Weeks    Status New    Target Date 07/14/21      PT LONG TERM GOAL #3   Title Patient will increase six minute walk test distance to >  1000 for progression to community ambulator and improve gait ability    Baseline unable to test at eval, ambulating  with walker; 1/16: 470 feet with SPC    Time 12    Period Weeks    Status Partially Met    Target Date 07/14/21      PT LONG TERM GOAL #4   Title Patient will increase 10 meter walk test to >1.10ms as to improve gait speed for better community ambulation and to reduce fall risk.    Baseline .320m 12/15 with RW, 1/16: 0.51 m/s without AD    Time 12    Period Weeks    Status New    Target Date 07/14/21      PT LONG TERM GOAL #5   Title Patient will be require no assist with ascend/descend 3 steps with step over gait pattern to facilitate safe and effecient entry to home    Baseline step to gait pattern at home    Time 10    Period Weeks    Status New    Target Date 06/30/21                   Plan - 06/03/21 1007     Clinical Impression Statement Pt motivated and partipated well within session. Pt continuing to make progress with range of motion following stretching, manual and therex interventions to improve motion. Pt demonstrated progress with sit to stand activity today and demonstrated improved form and ease with this task this date. Progressed with gait training using biodex and pt gait attern improved significantly with the feedback the biodex provided for step length. Pt also proided with some low back and hip stretches due to continued low back and hip pain.  Will continue to progress with ROm exercsies and functional activities in future sessions. Pt will continue to benefit from skilled PT intervention in order to imrove LE strength, Left knee ROM and overall QOL and function.    Personal Factors and Comorbidities Age    Examination-Activity Limitations Bed Mobility;Bend;Lift;Reach Overhead;Squat;Stairs;Stand    Examination-Participation Restrictions Driving;Community Activity;Meal Prep    Stability/Clinical Decision Making Stable/Uncomplicated    Rehab Potential Good    PT Frequency 2x / week    PT Duration 12 weeks    PT Treatment/Interventions ADLs/Self Care  Home Management;Aquatic Therapy;Electrical Stimulation;Moist Heat;DME Instruction;Gait training;Stair training;Functional mobility training;Therapeutic activities;Therapeutic exercise;Balance training;Neuromuscular re-education;Manual techniques;Manual lymph drainage;Compression bandaging;Passive range of motion;Joint Manipulations;Energy conservation;Dry needling;Taping;Vasopneumatic Device    PT Next Visit Plan Progress HEP, manual, stretching, balance, functional activities where appropriate    PT Home Exercise Plan handount provided, heel slides, quad sets, SLR, heel raises,    Consulted and Agree with Plan of Care Patient;Family member/caregiver             Patient will benefit from skilled therapeutic intervention in order to improve the following deficits and impairments:  Abnormal gait, Decreased activity tolerance, Decreased endurance, Decreased range of motion, Decreased strength, Improper body mechanics, Decreased balance, Difficulty walking, Decreased mobility  Visit Diagnosis: Abnormality of gait and mobility  Difficulty in walking, not elsewhere classified  Muscle weakness (generalized)  History of knee replacement, total, left     Problem List Patient Active Problem List   Diagnosis Date Noted   Primary osteoarthritis of left knee 04/18/2021   Weight loss 10/25/2020   Osteoporosis 07/07/2020   Primary osteoarthritis of right knee 02/23/2020   Lumbar radiculopathy 09/27/2018   Osteoarthritis of knee 08/26/2018   Degeneration of lumbar intervertebral  disc 08/20/2018   Bilateral chronic knee pain 06/06/2018   Hyperlipidemia    GERD (gastroesophageal reflux disease)    Frequent headaches    Arthritis     Particia Lather, PT 06/03/2021, 12:11 PM  [place] MAIN [facility] SERVICES [address] [place], [place], [address] Phone: [phone]   Fax:  [phone]  Name: Sherry Lamb MRN: [id number] Date of Birth:  1935/01/11

## 2021-06-07 ENCOUNTER — Ambulatory Visit: Payer: Medicare Other | Admitting: Physical Therapy

## 2021-06-10 ENCOUNTER — Ambulatory Visit: Payer: Medicare Other | Attending: Orthopedic Surgery | Admitting: Physical Therapy

## 2021-06-10 ENCOUNTER — Other Ambulatory Visit: Payer: Self-pay

## 2021-06-10 DIAGNOSIS — Z96652 Presence of left artificial knee joint: Secondary | ICD-10-CM | POA: Diagnosis present

## 2021-06-10 DIAGNOSIS — R262 Difficulty in walking, not elsewhere classified: Secondary | ICD-10-CM | POA: Diagnosis present

## 2021-06-10 DIAGNOSIS — R269 Unspecified abnormalities of gait and mobility: Secondary | ICD-10-CM | POA: Insufficient documentation

## 2021-06-10 NOTE — Therapy (Signed)
[place] MAIN [facility] SERVICES [address] [place], [place], [address] Phone: [phone]   Fax:  [phone]  Physical Therapy Treatment  Patient Details  Name: Sherry Lamb MRN: [id number] Date of Birth: 02-06-1935 Referring Provider (PT): Gaynelle Arabian   Encounter Date: 06/10/2021   PT End of Session - 06/10/21 0958     Visit Number 14    Number of Visits 24    Date for PT Re-Evaluation 07/14/21    Authorization Type [facility]    Progress Note Due on Visit 10    PT Start Time 0950    PT Stop Time 1047    PT Time Calculation (min) 57 min    Equipment Utilized During Treatment Gait belt    Activity Tolerance Patient limited by pain    Behavior During Therapy WFL for tasks assessed/performed             Past Medical History:  Diagnosis Date   Arthritis    Frequent headaches    GERD (gastroesophageal reflux disease)    Hyperlipidemia    Pneumonia    PONV (postoperative nausea and vomiting)     Past Surgical History:  Procedure Laterality Date   ABDOMINAL HYSTERECTOMY     ANKLE SURGERY     EYE SURGERY     Left hip replacement Left 2013   right hip replacement Right 2000   TONSILLECTOMY AND ADENOIDECTOMY  1943   TOTAL KNEE ARTHROPLASTY Right 02/23/2020   Procedure: TOTAL KNEE ARTHROPLASTY;  Surgeon: Gaynelle Arabian, MD;  Location: [facility];  Service: Orthopedics;  Laterality: Right;  66mn   TOTAL KNEE ARTHROPLASTY Left 04/18/2021   Procedure: TOTAL KNEE ARTHROPLASTY;  Surgeon: AGaynelle Arabian MD;  Location: [facility];  Service: Orthopedics;  Laterality: Left;   WISDOM TOOTH EXTRACTION      There were no vitals filed for this visit.   Subjective Assessment - 06/10/21 0955     Subjective Pt reports feeling a little better from her upper respiratory infection which caused her to miss an earlier appointment.    Patient is accompained by: Family member    Pertinent History Pt reports surgery on 02/16/21 on the Left knee. Reports  knee pain of 4-5/10 and complains of mostly soreness. Pt has had history of bilateral THA and right TKA in addition to current TKA procedure. Pt reports she was provided with HEP but has not been able to copmlete to this point. Pt is taking 1 tramadol in the AM and at night and is taking asprin.    Limitations Standing;Walking;House hold activities    How long can you stand comfortably? 5- 8 min    How long can you walk comfortably? 5-8 min    Pain Score 2     Pain Location Knee    Pain Orientation Left    Pain Descriptors / Indicators Sore    Pain Onset 1 to 4 weeks ago    Pain Frequency Intermittent               Exercise/Activity Sets/Reps/Time/ Resistance Assistance Charge type Comments  Step up X 10 BUE TherACT Cues for less UE propulsion  Manual HS, piriformis and hip stretching  X  multiple minutes   Manual Significant hip tightness, pt reports improved hip and low back pain following.   Step ups  X 10   therACT Step over step pattern   Nustep - UE and LE X 6 min level 2 setup Therex  For PROM/ARM  on the right knee, able to achieve full ROM at seat level 8 this session  LAQ/ knee flexion 10 x 5 sec hold ea  Therex     Supine heel slides   1 x 10 x 5 sec   Therex   Cues for proper muscle activation Towel under heel  Min A from PT for end range knee flexion. Last set measured at 102 degrees knee flexion         Biodex gait trainer  X 3 min   TherACT Biodex TM- gait trainer using UE support and gait belt-  Step length= __.44 m RLE_.43_ m L Time on each foot _50_%R/_50_%L Gait velocity= .48 m/s Total Time on TM = 3 min  Ended walking training early due to RR and s/s of fatigue secondary to pt recovering from respiratory condition         Knee flexion stretch at steps  Hamstring stretch @ steps  2 x 45 sec  2 x 45 sec   Therex  For improving transitions from various surfaces  -   STS  2 X 10  BUE  Theract From adjustable treatment table, cues for forward lean and  positioing, cues for eccentric control of motion  -cues for weight shift anteriorly (pt has tendency to keep weight through heels and lose balance posteriorly.    ICE X4 min  No charge    Treatment Provided this session   Pt educated throughout session about proper posture and technique with exercises. Improved exercise technique, movement at target joints, use of target muscles after min to mod verbal, visual, tactile cues.  Note: Portions of this document were prepared using Dragon voice recognition software and although reviewed may contain unintentional dictation errors in syntax, grammar, or spelling.                            PT Education - 06/10/21 0957     Education Details Exercise form and technique    Person(s) Educated Patient    Methods Explanation    Comprehension Verbalized understanding              PT Short Term Goals - 05/23/21 0942       PT SHORT TERM GOAL #1   Title Patient will be independent in home exercise program to improve strength/mobility for better functional independence with ADLs.    Baseline pt has initial HEP from hospital but not been able to complete to this point; 1/16: reports doing HEP scattered, doing stretches with walker, intermittent adherence; reports being independent in exercise;    Time 4    Period Weeks    Status Partially Met    Target Date 05/19/21      PT SHORT TERM GOAL #2   Title Patient will increase FOTO score to equal to or greater than  54   to demonstrate statistically significant improvement in mobility and quality of life.    Baseline FOTO 49 at IE,    Time 4    Period Weeks    Status New               PT Long Term Goals - 05/23/21 0944       PT LONG TERM GOAL #1   Title Pt will improve Left knee ROM to 0-120 in order to facilatate smooth gait pattern and stair navigation    Baseline 9-60 on IE, 1/16: 5-85 degrees    Time  12    Period Weeks    Status Partially Met    Target  Date 07/14/21      PT LONG TERM GOAL #2   Title Patient (> 58 years old) will complete five times sit to stand test in < 15 seconds indicating an increased LE strength and improved balance.    Baseline 39.1, 1/16: 20.15 sec with BUE on arm rests    Time 12    Period Weeks    Status New    Target Date 07/14/21      PT LONG TERM GOAL #3   Title Patient will increase six minute walk test distance to >1000 for progression to community ambulator and improve gait ability    Baseline unable to test at eval, ambulating with walker; 1/16: 470 feet with SPC    Time 12    Period Weeks    Status Partially Met    Target Date 07/14/21      PT LONG TERM GOAL #4   Title Patient will increase 10 meter walk test to >1.35ms as to improve gait speed for better community ambulation and to reduce fall risk.    Baseline .325m 12/15 with RW, 1/16: 0.51 m/s without AD    Time 12    Period Weeks    Status New    Target Date 07/14/21      PT LONG TERM GOAL #5   Title Patient will be require no assist with ascend/descend 3 steps with step over gait pattern to facilitate safe and effecient entry to home    Baseline step to gait pattern at home    Time 10    Period Weeks    Status New    Target Date 06/30/21                   Plan - 06/10/21 1003     Clinical Impression Statement Pt motivated and partipated well within session. Pt continuing to make progress with range of motion following stretching, manual and therex interventions to improve motion. Pt demonstrated progress with sit to stand activity today and demonstrated improved form and ease with this task as well as funcitonal squatting activities  this date. Progressed with gait training using biodex and pt gait attern improved significantly with the feedback the biodex provided for step length although pt still ambulaitng below norms for speed her step length is close to equal along with time spent on ea LE during gait. Will continue to  progress with ROM exercsies and functional activities in future sessions. Pt will continue to benefit from skilled PT intervention in order to imrove LE strength, Left knee ROM and overall QOL and function.    Personal Factors and Comorbidities Age    Examination-Activity Limitations Bed Mobility;Bend;Lift;Reach Overhead;Squat;Stairs;Stand    Examination-Participation Restrictions Driving;Community Activity;Meal Prep    Stability/Clinical Decision Making Stable/Uncomplicated    Rehab Potential Good    PT Frequency 2x / week    PT Duration 12 weeks    PT Treatment/Interventions ADLs/Self Care Home Management;Aquatic Therapy;Electrical Stimulation;Moist Heat;DME Instruction;Gait training;Stair training;Functional mobility training;Therapeutic activities;Therapeutic exercise;Balance training;Neuromuscular re-education;Manual techniques;Manual lymph drainage;Compression bandaging;Passive range of motion;Joint Manipulations;Energy conservation;Dry needling;Taping;Vasopneumatic Device    PT Next Visit Plan Progress HEP, manual, stretching, balance, functional activities where appropriate    PT Home Exercise Plan handount provided, heel slides, quad sets, SLR, heel raises,    Consulted and Agree with Plan of Care Patient;Family member/caregiver  Patient will benefit from skilled therapeutic intervention in order to improve the following deficits and impairments:  Abnormal gait, Decreased activity tolerance, Decreased endurance, Decreased range of motion, Decreased strength, Improper body mechanics, Decreased balance, Difficulty walking, Decreased mobility  Visit Diagnosis: Abnormality of gait and mobility  Difficulty in walking, not elsewhere classified  History of knee replacement, total, left  Status post total left knee replacement     Problem List Patient Active Problem List   Diagnosis Date Noted   Primary osteoarthritis of left knee 04/18/2021   Weight loss 10/25/2020    Osteoporosis 07/07/2020   Primary osteoarthritis of right knee 02/23/2020   Lumbar radiculopathy 09/27/2018   Osteoarthritis of knee 08/26/2018   Degeneration of lumbar intervertebral disc 08/20/2018   Bilateral chronic knee pain 06/06/2018   Hyperlipidemia    GERD (gastroesophageal reflux disease)    Frequent headaches    Arthritis     Particia Lather, PT 06/10/2021, 11:12 AM  [place], [place], [address] Phone: [phone]   Fax:  [phone]  Name: Sherry Lamb MRN: [id number] Date of Birth: 03/12/1935

## 2021-06-14 ENCOUNTER — Other Ambulatory Visit: Payer: Self-pay

## 2021-06-14 ENCOUNTER — Ambulatory Visit: Payer: Medicare Other | Admitting: Physical Therapy

## 2021-06-14 DIAGNOSIS — R269 Unspecified abnormalities of gait and mobility: Secondary | ICD-10-CM | POA: Diagnosis not present

## 2021-06-14 DIAGNOSIS — Z96652 Presence of left artificial knee joint: Secondary | ICD-10-CM

## 2021-06-14 DIAGNOSIS — R262 Difficulty in walking, not elsewhere classified: Secondary | ICD-10-CM

## 2021-06-14 NOTE — Therapy (Signed)
[place] MAIN [facility] SERVICES [address] [place], [place], [address] Phone: [phone]   Fax:  [phone]  Physical Therapy Treatment  Patient Details  Name: Sherry Lamb MRN: [id number] Date of Birth: 16-Aug-1934 Referring Provider (PT): Gaynelle Arabian   Encounter Date: 06/14/2021   PT End of Session - 06/14/21 1623     Visit Number 15    Number of Visits 24    Date for PT Re-Evaluation 07/14/21    Authorization Type [facility] [employer]    Progress Note Due on Visit 20    PT Start Time 1615    PT Stop Time 1700    PT Time Calculation (min) 45 min    Equipment Utilized During Treatment Gait belt    Activity Tolerance Patient limited by pain    Behavior During Therapy WFL for tasks assessed/performed             Past Medical History:  Diagnosis Date   Arthritis    Frequent headaches    GERD (gastroesophageal reflux disease)    Hyperlipidemia    Pneumonia    PONV (postoperative nausea and vomiting)     Past Surgical History:  Procedure Laterality Date   ABDOMINAL HYSTERECTOMY     ANKLE SURGERY     EYE SURGERY     Left hip replacement Left 2013   right hip replacement Right 2000   TONSILLECTOMY AND ADENOIDECTOMY  1943   TOTAL KNEE ARTHROPLASTY Right 02/23/2020   Procedure: TOTAL KNEE ARTHROPLASTY;  Surgeon: Gaynelle Arabian, MD;  Location: [facility];  Service: Orthopedics;  Laterality: Right;  59mn   TOTAL KNEE ARTHROPLASTY Left 04/18/2021   Procedure: TOTAL KNEE ARTHROPLASTY;  Surgeon: AGaynelle Arabian MD;  Location: [facility];  Service: Orthopedics;  Laterality: Left;   WISDOM TOOTH EXTRACTION      There were no vitals filed for this visit.   Subjective Assessment - 06/14/21 1621     Subjective Pt reports she is feeling better this session compared to previous session. She presents ambulating into clinic wihtout transport chair this session.    Patient is accompained by: Family member    Pertinent History Pt reports surgery on  02/16/21 on the Left knee. Reports knee pain of 4-5/10 and complains of mostly soreness. Pt has had history of bilateral THA and right TKA in addition to current TKA procedure. Pt reports she was provided with HEP but has not been able to copmlete to this point. Pt is taking 1 tramadol in the AM and at night and is taking asprin.    Limitations Standing;Walking;House hold activities    How long can you stand comfortably? 5- 8 min    How long can you walk comfortably? 5-8 min    Pain Onset 1 to 4 weeks ago             Exercise/Activity Sets/Reps/Time/ Resistance Assistance Charge type Comments        Manual overpressure in knee extension and knee flexion x 30 sec repetitions  X  multiple minutes   Manual Significant hip tightness, pt reports improved hip and low back pain following.   Step ups  2 X 10   therACT Step over step pattern   Nustep - UE and LE X 6 min level 2 setup Therex  For PROM/ARM on the right knee, able to achieve full ROM at seat level 8 this session  LAQ/ knee flexion 10 x 5 sec hold ea  Therex  Supine heel slides   1 x 10 x 5 sec   Therex   Cues for proper muscle activation Towel under heel  Min A from PT for end range knee flexion. Last set measured at 105 degrees knee flexion                     Knee flexion stretch at steps  Hamstring stretch @ steps  2 x 45 sec  2 x 45 sec   Therex  For improving transitions from various surfaces  -   STS  2 X 10   Theract From adjustable treatment table, cues for forward lean and positioing, cues for eccentric control of motion    ICE X4 min  No charge    Treatment Provided this session   Pt educated throughout session about proper posture and technique with exercises. Improved exercise technique, movement at target joints, use of target muscles after min to mod verbal, visual, tactile cues.  Note: Portions of this document were prepared using Dragon voice recognition software and although reviewed may contain  unintentional dictation errors in syntax, grammar, or spelling.                               PT Education - 06/14/21 1623     Education Details Exercise form and technique    Person(s) Educated Patient    Methods Explanation    Comprehension Verbalized understanding              PT Short Term Goals - 05/23/21 0942       PT SHORT TERM GOAL #1   Title Patient will be independent in home exercise program to improve strength/mobility for better functional independence with ADLs.    Baseline pt has initial HEP from hospital but not been able to complete to this point; 1/16: reports doing HEP scattered, doing stretches with walker, intermittent adherence; reports being independent in exercise;    Time 4    Period Weeks    Status Partially Met    Target Date 05/19/21      PT SHORT TERM GOAL #2   Title Patient will increase FOTO score to equal to or greater than  54   to demonstrate statistically significant improvement in mobility and quality of life.    Baseline FOTO 49 at IE,    Time 4    Period Weeks    Status New               PT Long Term Goals - 05/23/21 0944       PT LONG TERM GOAL #1   Title Pt will improve Left knee ROM to 0-120 in order to facilatate smooth gait pattern and stair navigation    Baseline 9-60 on IE, 1/16: 5-85 degrees    Time 12    Period Weeks    Status Partially Met    Target Date 07/14/21      PT LONG TERM GOAL #2   Title Patient (> 55 years old) will complete five times sit to stand test in < 15 seconds indicating an increased LE strength and improved balance.    Baseline 39.1, 1/16: 20.15 sec with BUE on arm rests    Time 12    Period Weeks    Status New    Target Date 07/14/21      PT LONG TERM GOAL #3   Title Patient will increase  six minute walk test distance to >1000 for progression to community ambulator and improve gait ability    Baseline unable to test at eval, ambulating with walker; 1/16: 470  feet with SPC    Time 12    Period Weeks    Status Partially Met    Target Date 07/14/21      PT LONG TERM GOAL #4   Title Patient will increase 10 meter walk test to >1.18ms as to improve gait speed for better community ambulation and to reduce fall risk.    Baseline .36m 12/15 with RW, 1/16: 0.51 m/s without AD    Time 12    Period Weeks    Status New    Target Date 07/14/21      PT LONG TERM GOAL #5   Title Patient will be require no assist with ascend/descend 3 steps with step over gait pattern to facilitate safe and effecient entry to home    Baseline step to gait pattern at home    Time 10    Period Weeks    Status New    Target Date 06/30/21                   Plan - 06/14/21 1624     Clinical Impression Statement Pt motivated and partipated well within session. Pt continuing to make progress with range of motion following stretching, manual and therex interventions to improve motion. Pt deomnstrates improved ease with step up activity this date. Pt demonstrated progress with sit to stand activity today and demonstrated improved form and ease with this task as well as funcitonal squatting activities. Will continue to progress with ROM exercsies and functional activities in future sessions. Pt will continue to benefit from skilled PT intervention in order to imrove LE strength, Left knee ROM and overall QOL and function.    Personal Factors and Comorbidities Age    Examination-Activity Limitations Bed Mobility;Bend;Lift;Reach Overhead;Squat;Stairs;Stand    Examination-Participation Restrictions Driving;Community Activity;Meal Prep    Stability/Clinical Decision Making Stable/Uncomplicated    Rehab Potential Good    PT Frequency 2x / week    PT Duration 12 weeks    PT Treatment/Interventions ADLs/Self Care Home Management;Aquatic Therapy;Electrical Stimulation;Moist Heat;DME Instruction;Gait training;Stair training;Functional mobility training;Therapeutic  activities;Therapeutic exercise;Balance training;Neuromuscular re-education;Manual techniques;Manual lymph drainage;Compression bandaging;Passive range of motion;Joint Manipulations;Energy conservation;Dry needling;Taping;Vasopneumatic Device    PT Next Visit Plan Progress HEP, manual, stretching, balance, functional activities where appropriate    PT Home Exercise Plan handount provided, heel slides, quad sets, SLR, heel raises,    Consulted and Agree with Plan of Care Patient;Family member/caregiver             Patient will benefit from skilled therapeutic intervention in order to improve the following deficits and impairments:  Abnormal gait, Decreased activity tolerance, Decreased endurance, Decreased range of motion, Decreased strength, Improper body mechanics, Decreased balance, Difficulty walking, Decreased mobility  Visit Diagnosis: Abnormality of gait and mobility  Difficulty in walking, not elsewhere classified  History of knee replacement, total, left     Problem List Patient Active Problem List   Diagnosis Date Noted   Primary osteoarthritis of left knee 04/18/2021   Weight loss 10/25/2020   Osteoporosis 07/07/2020   Primary osteoarthritis of right knee 02/23/2020   Lumbar radiculopathy 09/27/2018   Osteoarthritis of knee 08/26/2018   Degeneration of lumbar intervertebral disc 08/20/2018   Bilateral chronic knee pain 06/06/2018   Hyperlipidemia    GERD (gastroesophageal reflux disease)    Frequent headaches  Arthritis     Particia Lather, PT 06/14/2021, 5:01 PM  [place] MAIN [facility] SERVICES [address] [place], [place], [address] Phone: [phone]   Fax:  [phone]  Name: Sally-Anne Wamble MRN: [id number] Date of Birth: [birth year]

## 2021-06-17 ENCOUNTER — Encounter: Payer: Self-pay | Admitting: Physical Therapy

## 2021-06-17 ENCOUNTER — Other Ambulatory Visit: Payer: Self-pay

## 2021-06-17 ENCOUNTER — Ambulatory Visit: Payer: Medicare Other | Admitting: Physical Therapy

## 2021-06-17 DIAGNOSIS — R262 Difficulty in walking, not elsewhere classified: Secondary | ICD-10-CM

## 2021-06-17 DIAGNOSIS — R269 Unspecified abnormalities of gait and mobility: Secondary | ICD-10-CM

## 2021-06-17 DIAGNOSIS — Z96652 Presence of left artificial knee joint: Secondary | ICD-10-CM

## 2021-06-17 NOTE — Therapy (Signed)
[place] MAIN [facility] SERVICES [address] [place], [place], [address] Phone: [phone]   Fax:  [phone]  Physical Therapy Treatment  Patient Details  Name: Sherry Lamb MRN: [id number] Date of Birth: [birth year] Referring Provider (PT): Gaynelle Arabian   Encounter Date: 06/17/2021   PT End of Session - 06/17/21 1056     Visit Number 16    Number of Visits 24    Date for PT Re-Evaluation 07/14/21    Authorization Type [facility]    Progress Note Due on Visit 20    PT Start Time 1046    PT Stop Time 1130    PT Time Calculation (min) 44 min    Equipment Utilized During Treatment Gait belt    Activity Tolerance Patient limited by pain    Behavior During Therapy WFL for tasks assessed/performed             Past Medical History:  Diagnosis Date   Arthritis    Frequent headaches    GERD (gastroesophageal reflux disease)    Hyperlipidemia    Pneumonia    PONV (postoperative nausea and vomiting)     Past Surgical History:  Procedure Laterality Date   ABDOMINAL HYSTERECTOMY     ANKLE SURGERY     EYE SURGERY     Left hip replacement Left 2013   right hip replacement Right 2000   TONSILLECTOMY AND ADENOIDECTOMY  1943   TOTAL KNEE ARTHROPLASTY Right 02/23/2020   Procedure: TOTAL KNEE ARTHROPLASTY;  Surgeon: Gaynelle Arabian, MD;  Location: [facility];  Service: Orthopedics;  Laterality: Right;  33mn   TOTAL KNEE ARTHROPLASTY Left 04/18/2021   Procedure: TOTAL KNEE ARTHROPLASTY;  Surgeon: AGaynelle Arabian MD;  Location: [facility];  Service: Orthopedics;  Laterality: Left;   WISDOM TOOTH EXTRACTION      There were no vitals filed for this visit.   Subjective Assessment - 06/17/21 1054     Subjective Pt reports she is doing well with no significant changes since last session.    Patient is accompained by: Family member    Pertinent History Pt reports surgery on 02/16/21 on the Left knee. Reports knee pain of 4-5/10 and complains of  mostly soreness. Pt has had history of bilateral THA and right TKA in addition to current TKA procedure. Pt reports she was provided with HEP but has not been able to copmlete to this point. Pt is taking 1 tramadol in the AM and at night and is taking asprin.    Limitations Standing;Walking;House hold activities    How long can you stand comfortably? 5- 8 min    How long can you walk comfortably? 5-8 min    Currently in Pain? Yes    Pain Score 2     Pain Location Knee    Pain Orientation Left    Pain Onset 1 to 4 weeks ago             Exercise/Activity Sets/Reps/Time/ Resistance Assistance Charge type Comments              Step ups to airex pad  2 X 10  UE with cues for minimization of UE support ( fingertips)  therACT To improve stability with transitions to various surfaces    Nustep - UE and LE X 6 min level 2 setup Therex  For PROM/ARM on the right knee, able to achieve full ROM at seat level 8 this session  LAQ/ knee flexion 10 x 5 sec hold  ea  Therex     Supine heel slides   1 x 10 x 5 sec   Therex   Cues for proper muscle activation Towel under heel  Min A from PT for end range knee flexion. Last set measured at 105 degrees knee flexion   Airex stepping on L LE (3 airex pads in a row)  X several minutes   neuro Gradual regression in UE support with CGA throughout, good balance with min need for CGA for balance   Hurdle step overs leading with R LE  X several minutes   neuro To work on trailing LE foot clearance, cues to prevent compensatory movements at hip.   Heel raise  X 20  No UE support  neuro  To improve dynamic LE strength and balance with smaller BOS         STS  2 X 10   Theract From adjustable treatment table, lowest setting, cues for forward lean and positioing, cues for eccentric control of motion, difficulty on first repetition and required CGA to prevent quick return to seated position   ICE X4 min  No charge    Treatment Provided this session   Pt educated  throughout session about proper posture and technique with exercises. Improved exercise technique, movement at target joints, use of target muscles after min to mod verbal, visual, tactile cues.  Note: Portions of this document were prepared using Dragon voice recognition software and although reviewed may contain unintentional dictation errors in syntax, grammar, or spelling.                               PT Short Term Goals - 05/23/21 6440       PT SHORT TERM GOAL #1   Title Patient will be independent in home exercise program to improve strength/mobility for better functional independence with ADLs.    Baseline pt has initial HEP from hospital but not been able to complete to this point; 1/16: reports doing HEP scattered, doing stretches with walker, intermittent adherence; reports being independent in exercise;    Time 4    Period Weeks    Status Partially Met    Target Date 05/19/21      PT SHORT TERM GOAL #2   Title Patient will increase FOTO score to equal to or greater than  54   to demonstrate statistically significant improvement in mobility and quality of life.    Baseline FOTO 49 at IE,    Time 4    Period Weeks    Status New               PT Long Term Goals - 05/23/21 0944       PT LONG TERM GOAL #1   Title Pt will improve Left knee ROM to 0-120 in order to facilatate smooth gait pattern and stair navigation    Baseline 9-60 on IE, 1/16: 5-85 degrees    Time 12    Period Weeks    Status Partially Met    Target Date 07/14/21      PT LONG TERM GOAL #2   Title Patient (87 years old) will complete five times sit to stand test in < 15 seconds indicating an increased LE strength and improved balance.    Baseline 39.1, 1/16: 20.15 sec with BUE on arm rests    Time 12    Period Weeks    Status New  Target Date 07/14/21      PT LONG TERM GOAL #3   Title Patient will increase six minute walk test distance to >1000 for progression to  community ambulator and improve gait ability    Baseline unable to test at eval, ambulating with walker; 1/16: 470 feet with SPC    Time 12    Period Weeks    Status Partially Met    Target Date 07/14/21      PT LONG TERM GOAL #4   Title Patient will increase 10 meter walk test to >1.74ms as to improve gait speed for better community ambulation and to reduce fall risk.    Baseline .371m 12/15 with RW, 1/16: 0.51 m/s without AD    Time 12    Period Weeks    Status New    Target Date 07/14/21      PT LONG TERM GOAL #5   Title Patient will be require no assist with ascend/descend 3 steps with step over gait pattern to facilitate safe and effecient entry to home    Baseline step to gait pattern at home    Time 10    Period Weeks    Status New    Target Date 06/30/21                   Plan - 06/17/21 1056     Clinical Impression Statement Pt motivated and partipated well within session. Pt continuing to make progress with range of motion following stretching, manual and therex interventions to improve motion. Pt deomnstrates improved ease with step up activity this date. Pt demonstrated progress with sit to stand activity today and demonstrated improved form and ease with this task as well as funcitonal squatting activities. Will continue to progress with ROM exercsies and functional activities in future sessions. Pt will continue to benefit from skilled PT intervention in order to imrove LE strength, Left knee ROM and overall QOL and function.    Personal Factors and Comorbidities Age    Examination-Activity Limitations Bed Mobility;Bend;Lift;Reach Overhead;Squat;Stairs;Stand    Examination-Participation Restrictions Driving;Community Activity;Meal Prep    Stability/Clinical Decision Making Stable/Uncomplicated    Rehab Potential Good    PT Frequency 2x / week    PT Duration 12 weeks    PT Treatment/Interventions ADLs/Self Care Home Management;Aquatic Therapy;Electrical  Stimulation;Moist Heat;DME Instruction;Gait training;Stair training;Functional mobility training;Therapeutic activities;Therapeutic exercise;Balance training;Neuromuscular re-education;Manual techniques;Manual lymph drainage;Compression bandaging;Passive range of motion;Joint Manipulations;Energy conservation;Dry needling;Taping;Vasopneumatic Device    PT Next Visit Plan Progress HEP, manual, stretching, balance, functional activities where appropriate    PT Home Exercise Plan handount provided, heel slides, quad sets, SLR, heel raises,    Consulted and Agree with Plan of Care Patient;Family member/caregiver             Patient will benefit from skilled therapeutic intervention in order to improve the following deficits and impairments:  Abnormal gait, Decreased activity tolerance, Decreased endurance, Decreased range of motion, Decreased strength, Improper body mechanics, Decreased balance, Difficulty walking, Decreased mobility  Visit Diagnosis: Abnormality of gait and mobility  Difficulty in walking, not elsewhere classified  History of knee replacement, total, left  Status post total left knee replacement     Problem List Patient Active Problem List   Diagnosis Date Noted   Primary osteoarthritis of left knee 04/18/2021   Weight loss 10/25/2020   Osteoporosis 07/07/2020   Primary osteoarthritis of right knee 02/23/2020   Lumbar radiculopathy 09/27/2018   Osteoarthritis of knee 08/26/2018   Degeneration of  lumbar intervertebral disc 08/20/2018   Bilateral chronic knee pain 06/06/2018   Hyperlipidemia    GERD (gastroesophageal reflux disease)    Frequent headaches    Arthritis     Particia Lather, PT 06/17/2021, 11:00 AM  [place] MAIN [facility] SERVICES [address] [place], [place], [address] Phone: [phone]   Fax:  [phone]  Name: Sherry Lamb MRN: [id number] Date of Birth: 06-Apr-1935

## 2021-06-22 ENCOUNTER — Encounter: Payer: Self-pay | Admitting: Podiatry

## 2021-06-22 ENCOUNTER — Ambulatory Visit: Payer: Medicare Other | Admitting: Podiatry

## 2021-06-22 ENCOUNTER — Ambulatory Visit: Payer: Medicare Other

## 2021-06-22 ENCOUNTER — Other Ambulatory Visit: Payer: Self-pay

## 2021-06-22 DIAGNOSIS — R262 Difficulty in walking, not elsewhere classified: Secondary | ICD-10-CM

## 2021-06-22 DIAGNOSIS — M79674 Pain in right toe(s): Secondary | ICD-10-CM

## 2021-06-22 DIAGNOSIS — R269 Unspecified abnormalities of gait and mobility: Secondary | ICD-10-CM

## 2021-06-22 DIAGNOSIS — B351 Tinea unguium: Secondary | ICD-10-CM | POA: Diagnosis not present

## 2021-06-22 DIAGNOSIS — Z96652 Presence of left artificial knee joint: Secondary | ICD-10-CM

## 2021-06-22 DIAGNOSIS — M79675 Pain in left toe(s): Secondary | ICD-10-CM

## 2021-06-22 NOTE — Therapy (Signed)
[place] MAIN [facility] SERVICES [address] [place], [place], [address] Phone: [phone]   Fax:  [phone]  Physical Therapy Treatment  Patient Details  Name: Sherry Lamb MRN: [id number] Date of Birth: [birth year] Referring Provider (PT): Gaynelle Arabian   Encounter Date: 06/22/2021   PT End of Session - 06/22/21 1605     Visit Number 17    Number of Visits 24    Date for PT Re-Evaluation 07/14/21    Authorization Type [facility] [employer]    Progress Note Due on Visit 20    PT Start Time 1600    PT Stop Time 2637    PT Time Calculation (min) 44 min    Equipment Utilized During Treatment Gait belt    Activity Tolerance Patient limited by pain    Behavior During Therapy WFL for tasks assessed/performed             Past Medical History:  Diagnosis Date   Arthritis    Frequent headaches    GERD (gastroesophageal reflux disease)    Hyperlipidemia    Pneumonia    PONV (postoperative nausea and vomiting)     Past Surgical History:  Procedure Laterality Date   ABDOMINAL HYSTERECTOMY     ANKLE SURGERY     EYE SURGERY     Left hip replacement Left 2013   right hip replacement Right 2000   TONSILLECTOMY AND ADENOIDECTOMY  1943   TOTAL KNEE ARTHROPLASTY Right 02/23/2020   Procedure: TOTAL KNEE ARTHROPLASTY;  Surgeon: Gaynelle Arabian, MD;  Location: [facility];  Service: Orthopedics;  Laterality: Right;  35mn   TOTAL KNEE ARTHROPLASTY Left 04/18/2021   Procedure: TOTAL KNEE ARTHROPLASTY;  Surgeon: AGaynelle Arabian MD;  Location: [facility];  Service: Orthopedics;  Laterality: Left;   WISDOM TOOTH EXTRACTION      There were no vitals filed for this visit.   Subjective Assessment - 06/22/21 1603     Subjective Patient reports she was fatigued from waking up early this morning. Patient reports no falls or LOB since last session.    Patient is accompained by: Family member    Pertinent History Pt reports surgery on 02/16/21 on the Left knee. Reports  knee pain of 4-5/10 and complains of mostly soreness. Pt has had history of bilateral THA and right TKA in addition to current TKA procedure. Pt reports she was provided with HEP but has not been able to copmlete to this point. Pt is taking 1 tramadol in the AM and at night and is taking asprin.    Limitations Standing;Walking;House hold activities    How long can you stand comfortably? 5- 8 min    How long can you walk comfortably? 5-8 min    Currently in Pain? Yes    Pain Score 2     Pain Location Knee    Pain Orientation Left    Pain Descriptors / Indicators Sore    Pain Type Surgical pain    Pain Onset 1 to 4 weeks ago                    Exercise/Activity Sets/Reps/Time/ Resistance Assistance Charge type Comments                        Step ups to 6" 15x  UE with cues for minimization of UE support ( fingertips)  therex To improve stability with transitions to various surfaces     Nustep - UE  and LE X 6 min level 2 setup Therex  For PROM/ARM on the right knee, able to achieve full ROM at seat level 8 this session  LAQ/ knee flexion 10 x 5 sec hold ea   Therex       Supine heel slides    1 x 10 x 5 sec    Therex   Cues for proper muscle activation Towel under heel  Min A from PT for end range knee flexion. Last set measured at 105 degrees knee flexion   Airex stepping on L LE (3 airex pads in a row)  X several minutes    neuro Gradual regression in UE support with CGA throughout, good balance with min need for CGA for balance   Hurdle step overs leading with R LE  X several minutes    neuro To work on trailing LE foot clearance, cues to prevent compensatory movements at hip.   Heel raise  X 20  No UE support  neuro  To improve dynamic LE strength and balance with smaller BOS              STS  2 X 10    Theract From adjustable treatment table, lowest setting, cues for forward lean and positioing, cues for eccentric control of motion, difficulty on first repetition and  required CGA to prevent quick return to seated position    ICE X4 min   No charge     Treatment Provided this session    Pt educated throughout session about proper posture and technique with exercises. Improved exercise technique, movement at target joints, use of target muscles after min to mod verbal, visual, tactile cues.   Patient tolerates physical therapy session well despite fatigue from long day. Intermittent knee pain and hip pain present but alleviated without additional treatment. Patient is highly motivated for progression of care at this time. She tolerates 6" step ups this session with no pain increase. Pt will continue to benefit from skilled PT intervention in order to imrove LE strength, Left knee ROM and overall QOL and function.                  PT Education - 06/22/21 1605     Education Details exercise technique, Arts development officer    Person(s) Educated Patient    Methods Explanation;Demonstration;Tactile cues;Verbal cues    Comprehension Verbalized understanding;Returned demonstration;Verbal cues required;Tactile cues required              PT Short Term Goals - 05/23/21 0942       PT SHORT TERM GOAL #1   Title Patient will be independent in home exercise program to improve strength/mobility for better functional independence with ADLs.    Baseline pt has initial HEP from hospital but not been able to complete to this point; 1/16: reports doing HEP scattered, doing stretches with walker, intermittent adherence; reports being independent in exercise;    Time 4    Period Weeks    Status Partially Met    Target Date 05/19/21      PT SHORT TERM GOAL #2   Title Patient will increase FOTO score to equal to or greater than  54   to demonstrate statistically significant improvement in mobility and quality of life.    Baseline FOTO 49 at IE,    Time 4    Period Weeks    Status New               PT  Long Term Goals - 05/23/21 0944       PT LONG  TERM GOAL #1   Title Pt will improve Left knee ROM to 0-120 in order to facilatate smooth gait pattern and stair navigation    Baseline 9-60 on IE, 1/16: 5-85 degrees    Time 12    Period Weeks    Status Partially Met    Target Date 07/14/21      PT LONG TERM GOAL #2   Title Patient (> 81 years old) will complete five times sit to stand test in < 15 seconds indicating an increased LE strength and improved balance.    Baseline 39.1, 1/16: 20.15 sec with BUE on arm rests    Time 12    Period Weeks    Status New    Target Date 07/14/21      PT LONG TERM GOAL #3   Title Patient will increase six minute walk test distance to >1000 for progression to community ambulator and improve gait ability    Baseline unable to test at eval, ambulating with walker; 1/16: 470 feet with SPC    Time 12    Period Weeks    Status Partially Met    Target Date 07/14/21      PT LONG TERM GOAL #4   Title Patient will increase 10 meter walk test to >1.6ms as to improve gait speed for better community ambulation and to reduce fall risk.    Baseline .361m 12/15 with RW, 1/16: 0.51 m/s without AD    Time 12    Period Weeks    Status New    Target Date 07/14/21      PT LONG TERM GOAL #5   Title Patient will be require no assist with ascend/descend 3 steps with step over gait pattern to facilitate safe and effecient entry to home    Baseline step to gait pattern at home    Time 10    Period Weeks    Status New    Target Date 06/30/21                   Plan - 06/22/21 1641     Clinical Impression Statement Patient tolerates physical therapy session well despite fatigue from long day. Intermittent knee pain and hip pain present but alleviated without additional treatment. Patient is highly motivated for progression of care at this time. She tolerates 6" step ups this session with no pain increase. Pt will continue to benefit from skilled PT intervention in order to imrove LE strength, Left knee  ROM and overall QOL and function.    Personal Factors and Comorbidities Age    Examination-Activity Limitations Bed Mobility;Bend;Lift;Reach Overhead;Squat;Stairs;Stand    Examination-Participation Restrictions Driving;Community Activity;Meal Prep    Stability/Clinical Decision Making Stable/Uncomplicated    Rehab Potential Good    PT Frequency 2x / week    PT Duration 12 weeks    PT Treatment/Interventions ADLs/Self Care Home Management;Aquatic Therapy;Electrical Stimulation;Moist Heat;DME Instruction;Gait training;Stair training;Functional mobility training;Therapeutic activities;Therapeutic exercise;Balance training;Neuromuscular re-education;Manual techniques;Manual lymph drainage;Compression bandaging;Passive range of motion;Joint Manipulations;Energy conservation;Dry needling;Taping;Vasopneumatic Device    PT Next Visit Plan Progress HEP, manual, stretching, balance, functional activities where appropriate    PT Home Exercise Plan handount provided, heel slides, quad sets, SLR, heel raises,    Consulted and Agree with Plan of Care Patient;Family member/caregiver             Patient will benefit from skilled therapeutic intervention in order to improve  the following deficits and impairments:  Abnormal gait, Decreased activity tolerance, Decreased endurance, Decreased range of motion, Decreased strength, Improper body mechanics, Decreased balance, Difficulty walking, Decreased mobility  Visit Diagnosis: Abnormality of gait and mobility  Difficulty in walking, not elsewhere classified  History of knee replacement, total, left     Problem List Patient Active Problem List   Diagnosis Date Noted   Primary osteoarthritis of left knee 04/18/2021   Weight loss 10/25/2020   Osteoporosis 07/07/2020   Primary osteoarthritis of right knee 02/23/2020   Lumbar radiculopathy 09/27/2018   Osteoarthritis of knee 08/26/2018   Degeneration of lumbar intervertebral disc 08/20/2018    Bilateral chronic knee pain 06/06/2018   Hyperlipidemia    GERD (gastroesophageal reflux disease)    Frequent headaches    Arthritis    Janna Arch, PT, DPT  06/22/2021, 4:47 PM  [place] MAIN [facility] SERVICES [place], [place], [address] Phone: [phone]   Fax:  [phone]  Name: Sherry Lamb MRN: [id number] Date of Birth: Sep 13, 1934

## 2021-06-22 NOTE — Progress Notes (Signed)
°  Subjective:  Patient ID: Sherry Lamb, female    DOB: [birth year],  MRN: [id number]  Chief Complaint  Patient presents with   Nail Problem    Nail trim RFC  No complaints today    87 y.o. female presents with the above complaint. History confirmed with patient.  Her nails are thickened and elongated and painful.  Debridement has been helpful.  She underwent recent knee replacement on the left side  Objective:  Physical Exam: warm, good capillary refill, no trophic changes or ulcerative lesions, normal DP and PT pulses and normal sensory exam.  She has thickened and elongated yellow toenails x10 with subungual debris Assessment:   1. Pain due to onychomycosis of toenails of both feet      Plan:  Patient was evaluated and treated and all questions answered.   Discussed the etiology and treatment options for the condition in detail with the patient. Educated patient on the topical and oral treatment options for mycotic nails. Recommended debridement of the nails today. Sharp and mechanical debridement performed of all painful and mycotic nails today. Nails debrided in length and thickness using a nail nipper to level of comfort. Discussed treatment options including appropriate shoe gear. Follow up as needed for painful nails.    Return in about 10 weeks (around 08/31/2021) for painful thickened nails.

## 2021-06-23 ENCOUNTER — Encounter: Payer: Medicare Other | Admitting: Physical Therapy

## 2021-06-28 ENCOUNTER — Ambulatory Visit: Payer: Medicare Other | Admitting: Physical Therapy

## 2021-06-28 ENCOUNTER — Other Ambulatory Visit: Payer: Self-pay

## 2021-06-28 DIAGNOSIS — R262 Difficulty in walking, not elsewhere classified: Secondary | ICD-10-CM

## 2021-06-28 DIAGNOSIS — R269 Unspecified abnormalities of gait and mobility: Secondary | ICD-10-CM | POA: Diagnosis not present

## 2021-06-28 DIAGNOSIS — Z96652 Presence of left artificial knee joint: Secondary | ICD-10-CM

## 2021-06-28 NOTE — Therapy (Signed)
[place] MAIN [facility] SERVICES [address] [place], [place], [address] Phone: [phone]   Fax:  [phone]  Physical Therapy Treatment  Patient Details  Name: Sherry Lamb MRN: [id number] Date of Birth: Nov 27, 1934 Referring Provider (PT): Gaynelle Arabian   Encounter Date: 06/28/2021   PT End of Session - 06/28/21 1032     Visit Number 18    Number of Visits 24    Date for PT Re-Evaluation 07/14/21    Authorization Type [facility]    Progress Note Due on Visit 20    PT Start Time 1018    PT Stop Time 1100    PT Time Calculation (min) 42 min    Equipment Utilized During Treatment Gait belt    Activity Tolerance Patient limited by pain    Behavior During Therapy WFL for tasks assessed/performed             Past Medical History:  Diagnosis Date   Arthritis    Frequent headaches    GERD (gastroesophageal reflux disease)    Hyperlipidemia    Pneumonia    PONV (postoperative nausea and vomiting)     Past Surgical History:  Procedure Laterality Date   ABDOMINAL HYSTERECTOMY     ANKLE SURGERY     EYE SURGERY     Left hip replacement Left 2013   right hip replacement Right 2000   TONSILLECTOMY AND ADENOIDECTOMY  1943   TOTAL KNEE ARTHROPLASTY Right 02/23/2020   Procedure: TOTAL KNEE ARTHROPLASTY;  Surgeon: Gaynelle Arabian, MD;  Location: [facility];  Service: Orthopedics;  Laterality: Right;  58mn   TOTAL KNEE ARTHROPLASTY Left 04/18/2021   Procedure: TOTAL KNEE ARTHROPLASTY;  Surgeon: AGaynelle Arabian MD;  Location: [facility];  Service: Orthopedics;  Laterality: Left;   WISDOM TOOTH EXTRACTION      There were no vitals filed for this visit.   Subjective Assessment - 06/28/21 1031     Subjective Pt reports no pain at start of therapy, some discomfort with exercises but overall feeling good. No significant changes since last visit. Has follow up with pA today for knee.    Patient is accompained by: Family member    Pertinent History  Pt reports surgery on 02/16/21 on the Left knee. Reports knee pain of 4-5/10 and complains of mostly soreness. Pt has had history of bilateral THA and right TKA in addition to current TKA procedure. Pt reports she was provided with HEP but has not been able to copmlete to this point. Pt is taking 1 tramadol in the AM and at night and is taking asprin.    Limitations Standing;Walking;House hold activities    How long can you stand comfortably? 5- 8 min    How long can you walk comfortably? 5-8 min    Currently in Pain? No/denies   some with exercise, none with resting   Pain Onset 1 to 4 weeks ago                Exercise/Activity Sets/Reps/Time/ Resistance Assistance Charge type Comments        side step ups   x12  UUE theract Cues for UE assist   Step ups to airex pad and 4 inch step  2 X 10  First set UE support, second set CGA and intermittent UE support  therACT To improve stability with transitions to various surfaces    Nustep - UE and LE X 6 min level 3 setup Therex  For PROM/ARM on the  right knee, able to achieve full ROM at seat level 8 this session  LAQ/ knee flexion 10 x 5 sec hold ea with 3# AW donned   Therex  To improve knee ext strength and knee flex ROM    Supine heel slides   1 x 10 x 5 sec   Therex   Cues for proper muscle activation Towel under heel  Min A from PT for end range knee flexion. Last set measured at 105 degrees knee flexion   marching x20 no UE support theract Increased difficulty with SLS on the involved side  End range knee flexion and extension stretching and PROM  X multiple minutes with end range holds   Manual    Heel raise  X 20  No UE support  therex  To improve dynamic LE strength and balance with smaller BOS                ICE X4 min  No charge    Treatment Provided this session   Pt educated throughout session about proper posture and technique with exercises. Improved exercise technique, movement at target joints, use of target  muscles after min to mod verbal, visual, tactile cues.  Note: Portions of this document were prepared using Dragon voice recognition software and although reviewed may contain unintentional dictation errors in syntax, grammar, or spelling.                          PT Education - 06/28/21 1032     Education Details exercise technique, body mechanics    Person(s) Educated Patient    Methods Explanation;Demonstration    Comprehension Verbalized understanding              PT Short Term Goals - 05/23/21 0942       PT SHORT TERM GOAL #1   Title Patient will be independent in home exercise program to improve strength/mobility for better functional independence with ADLs.    Baseline pt has initial HEP from hospital but not been able to complete to this point; 1/16: reports doing HEP scattered, doing stretches with walker, intermittent adherence; reports being independent in exercise;    Time 4    Period Weeks    Status Partially Met    Target Date 05/19/21      PT SHORT TERM GOAL #2   Title Patient will increase FOTO score to equal to or greater than  54   to demonstrate statistically significant improvement in mobility and quality of life.    Baseline FOTO 49 at IE,    Time 4    Period Weeks    Status New               PT Long Term Goals - 05/23/21 0944       PT LONG TERM GOAL #1   Title Pt will improve Left knee ROM to 0-120 in order to facilatate smooth gait pattern and stair navigation    Baseline 9-60 on IE, 1/16: 5-85 degrees    Time 12    Period Weeks    Status Partially Met    Target Date 07/14/21      PT LONG TERM GOAL #2   Title Patient (> 49 years old) will complete five times sit to stand test in < 15 seconds indicating an increased LE strength and improved balance.    Baseline 39.1, 1/16: 20.15 sec with BUE on arm rests  Time 12    Period Weeks    Status New    Target Date 07/14/21      PT LONG TERM GOAL #3   Title Patient  will increase six minute walk test distance to >1000 for progression to community ambulator and improve gait ability    Baseline unable to test at eval, ambulating with walker; 1/16: 470 feet with SPC    Time 12    Period Weeks    Status Partially Met    Target Date 07/14/21      PT LONG TERM GOAL #4   Title Patient will increase 10 meter walk test to >1.37ms as to improve gait speed for better community ambulation and to reduce fall risk.    Baseline .326m 12/15 with RW, 1/16: 0.51 m/s without AD    Time 12    Period Weeks    Status New    Target Date 07/14/21      PT LONG TERM GOAL #5   Title Patient will be require no assist with ascend/descend 3 steps with step over gait pattern to facilitate safe and effecient entry to home    Baseline step to gait pattern at home    Time 10    Period Weeks    Status New    Target Date 06/30/21                   Plan - 06/28/21 1038     Clinical Impression Statement Pt continues to present with excellent motivation for completion of PT interventions. Pt continues to make progress with knee ROm and function with therapy sessions.  Patient also continues to report less pain in her knee as well as the area surrounding the greater trochanter.  Patient progressed to step up activity to 6 inch step with Airex pad.  She improved with lower extremity dynamic and functional strength.  Patient will continue to benefit from skilled physical therapy intervention in order to improve her range of motion, strength, endurance, and improve her overall quality of life.    Personal Factors and Comorbidities Age    Examination-Activity Limitations Bed Mobility;Bend;Lift;Reach Overhead;Squat;Stairs;Stand    Examination-Participation Restrictions Driving;Community Activity;Meal Prep    Stability/Clinical Decision Making Stable/Uncomplicated    Rehab Potential Good    PT Frequency 2x / week    PT Duration 12 weeks    PT Treatment/Interventions ADLs/Self  Care Home Management;Aquatic Therapy;Electrical Stimulation;Moist Heat;DME Instruction;Gait training;Stair training;Functional mobility training;Therapeutic activities;Therapeutic exercise;Balance training;Neuromuscular re-education;Manual techniques;Manual lymph drainage;Compression bandaging;Passive range of motion;Joint Manipulations;Energy conservation;Dry needling;Taping;Vasopneumatic Device    PT Next Visit Plan Progress HEP, manual, stretching, balance, functional activities where appropriate    PT Home Exercise Plan handount provided, heel slides, quad sets, SLR, heel raises,    Consulted and Agree with Plan of Care Patient;Family member/caregiver             Patient will benefit from skilled therapeutic intervention in order to improve the following deficits and impairments:  Abnormal gait, Decreased activity tolerance, Decreased endurance, Decreased range of motion, Decreased strength, Improper body mechanics, Decreased balance, Difficulty walking, Decreased mobility  Visit Diagnosis: Abnormality of gait and mobility  Difficulty in walking, not elsewhere classified  History of knee replacement, total, left     Problem List Patient Active Problem List   Diagnosis Date Noted   Primary osteoarthritis of left knee 04/18/2021   Weight loss 10/25/2020   Osteoporosis 07/07/2020   Primary osteoarthritis of right knee 02/23/2020   Lumbar radiculopathy 09/27/2018  Osteoarthritis of knee 08/26/2018   Degeneration of lumbar intervertebral disc 08/20/2018   Bilateral chronic knee pain 06/06/2018   Hyperlipidemia    GERD (gastroesophageal reflux disease)    Frequent headaches    Arthritis     Particia Lather, PT 06/28/2021, 1:02 PM  [place] MAIN [facility] SERVICES [address] [place], [place], [address] Phone: [phone]   Fax:  [phone]  Name: Ricca Melgarejo MRN: [id number] Date of Birth: May 07, 1935

## 2021-06-30 ENCOUNTER — Other Ambulatory Visit: Payer: Self-pay

## 2021-06-30 ENCOUNTER — Encounter: Payer: Self-pay | Admitting: Physical Therapy

## 2021-06-30 ENCOUNTER — Ambulatory Visit: Payer: Medicare Other | Admitting: Physical Therapy

## 2021-06-30 DIAGNOSIS — R269 Unspecified abnormalities of gait and mobility: Secondary | ICD-10-CM

## 2021-06-30 DIAGNOSIS — R262 Difficulty in walking, not elsewhere classified: Secondary | ICD-10-CM

## 2021-06-30 DIAGNOSIS — Z96652 Presence of left artificial knee joint: Secondary | ICD-10-CM

## 2021-06-30 NOTE — Therapy (Signed)
[place] MAIN [facility] SERVICES [address] [place], [place], [address] Phone: [phone]   Fax:  [phone]  Physical Therapy Treatment  Patient Details  Name: Sherry Lamb MRN: [id number] Date of Birth: [birth year] Referring Provider (PT): Gaynelle Arabian   Encounter Date: 06/30/2021   PT End of Session - 06/30/21 1004     Visit Number 19    Number of Visits 24    Date for PT Re-Evaluation 07/14/21    Authorization Type [facility] [employer]    Progress Note Due on Visit 20    PT Start Time 1008    PT Stop Time 1053    PT Time Calculation (min) 45 min    Equipment Utilized During Treatment Gait belt    Activity Tolerance Patient limited by pain    Behavior During Therapy WFL for tasks assessed/performed             Past Medical History:  Diagnosis Date   Arthritis    Frequent headaches    GERD (gastroesophageal reflux disease)    Hyperlipidemia    Pneumonia    PONV (postoperative nausea and vomiting)     Past Surgical History:  Procedure Laterality Date   ABDOMINAL HYSTERECTOMY     ANKLE SURGERY     EYE SURGERY     Left hip replacement Left 2013   right hip replacement Right 2000   TONSILLECTOMY AND ADENOIDECTOMY  1943   TOTAL KNEE ARTHROPLASTY Right 02/23/2020   Procedure: TOTAL KNEE ARTHROPLASTY;  Surgeon: Gaynelle Arabian, MD;  Location: [facility];  Service: Orthopedics;  Laterality: Right;  74mn   TOTAL KNEE ARTHROPLASTY Left 04/18/2021   Procedure: TOTAL KNEE ARTHROPLASTY;  Surgeon: AGaynelle Arabian MD;  Location: [facility];  Service: Orthopedics;  Laterality: Left;   WISDOM TOOTH EXTRACTION      There were no vitals filed for this visit.   Subjective Assessment - 06/30/21 1004     Subjective Pt reports no issues with the knee and visit with PA went well earlier this week. Pt reports continued issues with the scar due to the way it looks but has no adhesions on the scar at this time.    Patient is accompained by: Family member     Pertinent History Pt reports surgery on 02/16/21 on the Left knee. Reports knee pain of 4-5/10 and complains of mostly soreness. Pt has had history of bilateral THA and right TKA in addition to current TKA procedure. Pt reports she was provided with HEP but has not been able to copmlete to this point. Pt is taking 1 tramadol in the AM and at night and is taking asprin.    Limitations Standing;Walking;House hold activities    How long can you stand comfortably? 5- 8 min    How long can you walk comfortably? 5-8 min    Pain Onset 1 to 4 weeks ago                Exercise/Activity Sets/Reps/Time/ Resistance Assistance Charge type Comments  Leg press  1 x 12 @ 40 #    Cues for LE placement and to maintain neutral hip alignment throughout   side step ups   x12  UUE theract Cues for UE assist and for LE placement   Step ups to airex pad and 6 inch step  2 X 10  First set UE support, second set CGA and intermittent UE support  therACT To improve stability with transitions to various surfaces  Nustep - UE and LE X 6 min level 3 setup No Charge   For PROM/ARM on the right knee  LAQ/ knee flexion 10 x 5 sec hold ea with 4# AW donned   Therex  To improve knee ext strength and knee flex ROM, increased resistance this session.   Supine heel slides   1 x 10 x 5 sec   Therex   Cues for proper muscle activation Towel under heel  Min A from PT for end range knee flexion. Last set measured at 109 degrees knee flexion   marching x20 no UE support theract Increased difficulty with SLS on the involved side  End range knee flexion and extension stretching and PROM, joint mobs in knee flexion ( AP) to improve flexion ROM   X multiple minutes with end range holds   Manual    [place] on 1/2 foam roller  X 20  No UE support  therex  To improve dynamic LE strength and balance with smaller BOS                ICE X4 min  No charge    Treatment Provided this session   Pt educated throughout session about  proper posture and technique with exercises. Improved exercise technique, movement at target joints, use of target muscles after min to mod verbal, visual, tactile cues.  Note: Portions of this document were prepared using Dragon voice recognition software and although reviewed may contain unintentional dictation errors in syntax, grammar, or spelling.                          PT Education - 06/30/21 1004     Education Details execise technique, body mechanics    Person(s) Educated Patient    Methods Explanation    Comprehension Verbalized understanding              PT Short Term Goals - 05/23/21 0942       PT SHORT TERM GOAL #1   Title Patient will be independent in home exercise program to improve strength/mobility for better functional independence with ADLs.    Baseline pt has initial HEP from hospital but not been able to complete to this point; 1/16: reports doing HEP scattered, doing stretches with walker, intermittent adherence; reports being independent in exercise;    Time 4    Period Weeks    Status Partially Met    Target Date 05/19/21      PT SHORT TERM GOAL #2   Title Patient will increase FOTO score to equal to or greater than  54   to demonstrate statistically significant improvement in mobility and quality of life.    Baseline FOTO 49 at IE,    Time 4    Period Weeks    Status New               PT Long Term Goals - 05/23/21 0944       PT LONG TERM GOAL #1   Title Pt will improve Left knee ROM to 0-120 in order to facilatate smooth gait pattern and stair navigation    Baseline 9-60 on IE, 1/16: 5-85 degrees    Time 12    Period Weeks    Status Partially Met    Target Date 07/14/21      PT LONG TERM GOAL #2   Title Patient (> 43 years old) will complete five times sit to stand test in <  15 seconds indicating an increased LE strength and improved balance.    Baseline 39.1, 1/16: 20.15 sec with BUE on arm rests    Time 12     Period Weeks    Status New    Target Date 07/14/21      PT LONG TERM GOAL #3   Title Patient will increase six minute walk test distance to >1000 for progression to community ambulator and improve gait ability    Baseline unable to test at eval, ambulating with walker; 1/16: 470 feet with SPC    Time 12    Period Weeks    Status Partially Met    Target Date 07/14/21      PT LONG TERM GOAL #4   Title Patient will increase 10 meter walk test to >1.26ms as to improve gait speed for better community ambulation and to reduce fall risk.    Baseline .375m 12/15 with RW, 1/16: 0.51 m/s without AD    Time 12    Period Weeks    Status New    Target Date 07/14/21      PT LONG TERM GOAL #5   Title Patient will be require no assist with ascend/descend 3 steps with step over gait pattern to facilitate safe and effecient entry to home    Baseline step to gait pattern at home    Time 10    Period Weeks    Status New    Target Date 06/30/21                   Plan - 06/30/21 1004     Clinical Impression Statement Pt continues to present with excellent motivation for completion of PT interventions. Pt continues to make progress with knee ROM and function with therapy sessions. Patient also continues to report less pain in her knee as well as the area surrounding the greater trochanter. Pt progressed with ROM, step up activity and leg press in today's session.  Patient will continue to benefit from skilled physical therapy intervention in order to improve her range of motion, strength, endurance, and improve her overall quality of life.    Personal Factors and Comorbidities Age    Examination-Activity Limitations Bed Mobility;Bend;Lift;Reach Overhead;Squat;Stairs;Stand    Examination-Participation Restrictions Driving;Community Activity;Meal Prep    Stability/Clinical Decision Making Stable/Uncomplicated    Rehab Potential Good    PT Frequency 2x / week    PT Duration 12 weeks    PT  Treatment/Interventions ADLs/Self Care Home Management;Aquatic Therapy;Electrical Stimulation;Moist Heat;DME Instruction;Gait training;Stair training;Functional mobility training;Therapeutic activities;Therapeutic exercise;Balance training;Neuromuscular re-education;Manual techniques;Manual lymph drainage;Compression bandaging;Passive range of motion;Joint Manipulations;Energy conservation;Dry needling;Taping;Vasopneumatic Device    PT Next Visit Plan Progress HEP, manual, stretching, balance, functional activities where appropriate    PT Home Exercise Plan handount provided, heel slides, quad sets, SLR, heel raises,    Consulted and Agree with Plan of Care Patient;Family member/caregiver             Patient will benefit from skilled therapeutic intervention in order to improve the following deficits and impairments:  Abnormal gait, Decreased activity tolerance, Decreased endurance, Decreased range of motion, Decreased strength, Improper body mechanics, Decreased balance, Difficulty walking, Decreased mobility  Visit Diagnosis: Abnormality of gait and mobility  Difficulty in walking, not elsewhere classified  History of knee replacement, total, left     Problem List Patient Active Problem List   Diagnosis Date Noted   Primary osteoarthritis of left knee 04/18/2021   Weight loss 10/25/2020   Osteoporosis  07/07/2020   Primary osteoarthritis of right knee 02/23/2020   Lumbar radiculopathy 09/27/2018   Osteoarthritis of knee 08/26/2018   Degeneration of lumbar intervertebral disc 08/20/2018   Bilateral chronic knee pain 06/06/2018   Hyperlipidemia    GERD (gastroesophageal reflux disease)    Frequent headaches    Arthritis     Particia Lather, PT 06/30/2021, 11:53 AM  [place] MAIN [facility] SERVICES [address] [place], [place], [address] Phone: [phone]   Fax:  [phone]  Name: Aubree Doody MRN: [id number] Date of Birth:  1934/11/01

## 2021-07-05 ENCOUNTER — Ambulatory Visit: Payer: Medicare Other | Admitting: Physical Therapy

## 2021-07-05 ENCOUNTER — Other Ambulatory Visit: Payer: Self-pay

## 2021-07-05 DIAGNOSIS — R262 Difficulty in walking, not elsewhere classified: Secondary | ICD-10-CM

## 2021-07-05 DIAGNOSIS — Z96652 Presence of left artificial knee joint: Secondary | ICD-10-CM

## 2021-07-05 DIAGNOSIS — R269 Unspecified abnormalities of gait and mobility: Secondary | ICD-10-CM | POA: Diagnosis not present

## 2021-07-05 NOTE — Therapy (Signed)
[place] MAIN [facility] SERVICES [address] [place], [place], [address] Phone: [phone]   Fax:  [phone]  Physical Therapy Treatment/ Physical Therapy Progress Note   Dates of reporting period  05/23/21   to   07/05/21   Patient Details  Name: Nyazia Canevari MRN: [id number] Date of Birth: [birth year] Referring Provider (PT): Gaynelle Arabian   Encounter Date: 07/05/2021   PT End of Session - 07/05/21 1019     Visit Number 20    Number of Visits 24    Date for PT Re-Evaluation 07/14/21    Authorization Type [facility]    Progress Note Due on Visit 20    PT Start Time 1015    PT Stop Time 1059    PT Time Calculation (min) 44 min    Equipment Utilized During Treatment Gait belt    Activity Tolerance Patient limited by pain    Behavior During Therapy WFL for tasks assessed/performed             Past Medical History:  Diagnosis Date   Arthritis    Frequent headaches    GERD (gastroesophageal reflux disease)    Hyperlipidemia    Pneumonia    PONV (postoperative nausea and vomiting)     Past Surgical History:  Procedure Laterality Date   ABDOMINAL HYSTERECTOMY     ANKLE SURGERY     EYE SURGERY     Left hip replacement Left 2013   right hip replacement Right 2000   TONSILLECTOMY AND ADENOIDECTOMY  1943   TOTAL KNEE ARTHROPLASTY Right 02/23/2020   Procedure: TOTAL KNEE ARTHROPLASTY;  Surgeon: Gaynelle Arabian, MD;  Location: [facility];  Service: Orthopedics;  Laterality: Right;  63mn   TOTAL KNEE ARTHROPLASTY Left 04/18/2021   Procedure: TOTAL KNEE ARTHROPLASTY;  Surgeon: AGaynelle Arabian MD;  Location: [facility];  Service: Orthopedics;  Laterality: Left;   WISDOM TOOTH EXTRACTION      There were no vitals filed for this visit.   Subjective Assessment - 07/05/21 1018     Subjective Pt reports some soreness in her knee at night over the past few days following increase in activity levels. Pt encouraged to continue to progress with her  activity levels.    Patient is accompained by: Family member    Pertinent History Pt reports surgery on 02/16/21 on the Left knee. Reports knee pain of 4-5/10 and complains of mostly soreness. Pt has had history of bilateral THA and right TKA in addition to current TKA procedure. Pt reports she was provided with HEP but has not been able to copmlete to this point. Pt is taking 1 tramadol in the AM and at night and is taking asprin.    Limitations Standing;Walking;House hold activities    How long can you stand comfortably? 5- 8 min    How long can you walk comfortably? 5-8 min    Pain Onset 1 to 4 weeks ago              Physical therapy treatment session today consisted of completing assessment of goals and administration of testing as demonstrated in flow sheet. Addition treatments may be found below.    Exercise/Activity Sets/Reps/Time/ Resistance Assistance Charge type Comments  6MWT   Therex  915 feet with no AD   5 X STS: 18. 3 sec    Therex  Improvement in muscular power  hands on Les  Heel slides supine    Therex  Knee flexion 107 on last  set, some A from PT for hold times and to improve end range flexion.   Nustep Level 3 x 6 min   therex To improve LE endurance and knee rom, assistance for positioning.   Supine quad sets  10 x 5 sec holds   Therex   2 degrees from full extension on last repetition.                                       Treatment Provided this session   Pt educated throughout session about proper posture and technique with exercises. Improved exercise technique, movement at target joints, use of target muscles after min to mod verbal, visual, tactile cues. Note: Portions of this document were prepared using Dragon voice recognition software and although reviewed may contain unintentional dictation errors in syntax, grammar, or spelling.                          PT Education - 07/05/21 1019     Education Details Progress with therapy  at this time    Person(s) Educated Patient;Spouse    Methods Explanation    Comprehension Verbalized understanding;Returned demonstration              PT Short Term Goals - 07/05/21 1057       PT SHORT TERM GOAL #1   Title Patient will be independent in home exercise program to improve strength/mobility for better functional independence with ADLs.    Baseline pt has initial HEP from hospital but not been able to complete to this point; 1/16: reports doing HEP regularly on days not in PT    Time 4    Period Weeks    Status Achieved    Target Date 05/19/21      PT SHORT TERM GOAL #2   Title Patient will increase FOTO score to equal to or greater than  54   to demonstrate statistically significant improvement in mobility and quality of life.    Baseline FOTO 49 at IE,63 on 2/28    Time 4    Period Weeks    Status Achieved               PT Long Term Goals - 07/05/21 1058       PT LONG TERM GOAL #1   Title Pt will improve Left knee ROM to 0-120 in order to facilatate smooth gait pattern and stair navigation    Baseline 9-60 on IE, 1/16: 5-85 degrees 2/28: 2 degree extension 107 degreed flexion    Time 12    Period Weeks    Status Partially Met    Target Date 07/14/21      PT LONG TERM GOAL #2   Title Patient (> 26 years old) will complete five times sit to stand test in < 15 seconds indicating an increased LE strength and improved balance.    Baseline 39.1, 1/16: 20.15 sec with BUE on arm rests , 18.4 sec with UE on knees    Time 12    Period Weeks    Status New    Target Date 07/14/21      PT LONG TERM GOAL #3   Title Patient will increase six minute walk test distance to >1000 for progression to community ambulator and improve gait ability    Baseline unable to test at eval, ambulating with walker; 1/16:  470 feet with SPC    Time 12    Period Weeks    Status Partially Met    Target Date 07/14/21      PT LONG TERM GOAL #4   Title Patient will increase 10  meter walk test to >1.35ms as to improve gait speed for better community ambulation and to reduce fall risk.    Baseline .363m 12/15 with RW, 1/16: 0.51 m/s without AD, .86 m/s on 2/28    Time 12    Period Weeks    Status New    Target Date 07/14/21      PT LONG TERM GOAL #5   Title Patient will be require no assist with ascend/descend 3 steps with step over gait pattern to facilitate safe and effecient entry to home    Baseline step to gait pattern at home    Time 10    Period Weeks    Status New    Target Date 06/30/21                   Plan - 07/05/21 1020     Clinical Impression Statement Pt continues to present with excellent motivation for completion of PT interventions. Pt presents for progress note.  Pt continues to make progress with knee ROM and function with therapy sessions.  Patient progressed with all of her goals showing significant increase in 6-minute walk test distance indicating improved ambulatory endurance.  Patient also demonstrated improved 5 times sit to stand test indicating decreased fall risk as well as improved lower extremity strength and power.  Patient's left knee range of motion continuing to improve with so does not have flexion range of motion necessary for all functional tasks.  Patient was able to complete stair navigation with 1 handhold assist and with step over step pattern indicating improved function with this functional activity.  Patient will continue to benefit from skilled physical therapy intervention if she is not reached all of her therapeutic goals but she is progressing well toward all of them. Patient's condition has the potential to improve in response to therapy. Maximum improvement is yet to be obtained. The anticipated improvement is attainable and reasonable in a generally predictable time.      Personal Factors and Comorbidities Age    Examination-Activity Limitations Bed Mobility;Bend;Lift;Reach Overhead;Squat;Stairs;Stand     Examination-Participation Restrictions Driving;Community Activity;Meal Prep    Stability/Clinical Decision Making Stable/Uncomplicated    Rehab Potential Good    PT Frequency 2x / week    PT Duration 12 weeks    PT Treatment/Interventions ADLs/Self Care Home Management;Aquatic Therapy;Electrical Stimulation;Moist Heat;DME Instruction;Gait training;Stair training;Functional mobility training;Therapeutic activities;Therapeutic exercise;Balance training;Neuromuscular re-education;Manual techniques;Manual lymph drainage;Compression bandaging;Passive range of motion;Joint Manipulations;Energy conservation;Dry needling;Taping;Vasopneumatic Device    PT Next Visit Plan Progress HEP, manual, stretching, balance, functional activities where appropriate    PT Home Exercise Plan handount provided, heel slides, quad sets, SLR, heel raises,    Consulted and Agree with Plan of Care Patient;Family member/caregiver             Patient will benefit from skilled therapeutic intervention in order to improve the following deficits and impairments:  Abnormal gait, Decreased activity tolerance, Decreased endurance, Decreased range of motion, Decreased strength, Improper body mechanics, Decreased balance, Difficulty walking, Decreased mobility  Visit Diagnosis: Abnormality of gait and mobility  Difficulty in walking, not elsewhere classified  History of knee replacement, total, left     Problem List Patient Active Problem List   Diagnosis Date Noted  Primary osteoarthritis of left knee 04/18/2021   Weight loss 10/25/2020   Osteoporosis 07/07/2020   Primary osteoarthritis of right knee 02/23/2020   Lumbar radiculopathy 09/27/2018   Osteoarthritis of knee 08/26/2018   Degeneration of lumbar intervertebral disc 08/20/2018   Bilateral chronic knee pain 06/06/2018   Hyperlipidemia    GERD (gastroesophageal reflux disease)    Frequent headaches    Arthritis     Particia Lather, PT 07/05/2021,  1:11 PM  [place] MAIN [facility] SERVICES [address] [place], [place], [address] Phone: [phone]   Fax:  [phone]  Name: Cindel Daugherty MRN: [id number] Date of Birth: [birth year]

## 2021-07-12 ENCOUNTER — Ambulatory Visit: Payer: Medicare Other | Attending: Orthopedic Surgery | Admitting: Physical Therapy

## 2021-07-12 ENCOUNTER — Encounter: Payer: Self-pay | Admitting: Physical Therapy

## 2021-07-12 ENCOUNTER — Other Ambulatory Visit: Payer: Self-pay

## 2021-07-12 DIAGNOSIS — R269 Unspecified abnormalities of gait and mobility: Secondary | ICD-10-CM | POA: Insufficient documentation

## 2021-07-12 DIAGNOSIS — R262 Difficulty in walking, not elsewhere classified: Secondary | ICD-10-CM | POA: Diagnosis present

## 2021-07-12 DIAGNOSIS — Z96652 Presence of left artificial knee joint: Secondary | ICD-10-CM | POA: Diagnosis present

## 2021-07-12 NOTE — Therapy (Signed)
[place] MAIN [facility] SERVICES [address] [place], [place], [address] Phone: [phone]   Fax:  [phone]  Physical Therapy Treatment  Patient Details  Name: Ananda Caya MRN: [id number] Date of Birth: 13-Aug-1934 Referring Provider (PT): Gaynelle Arabian   Encounter Date: 07/12/2021   PT End of Session - 07/12/21 1015     Visit Number 21    Number of Visits 24    Date for PT Re-Evaluation 07/14/21    Authorization Type [facility] [employer]    Progress Note Due on Visit 20    PT Start Time 1009    PT Stop Time 1050    PT Time Calculation (min) 41 min    Equipment Utilized During Treatment Gait belt    Activity Tolerance Patient limited by pain    Behavior During Therapy WFL for tasks assessed/performed             Past Medical History:  Diagnosis Date   Arthritis    Frequent headaches    GERD (gastroesophageal reflux disease)    Hyperlipidemia    Pneumonia    PONV (postoperative nausea and vomiting)     Past Surgical History:  Procedure Laterality Date   ABDOMINAL HYSTERECTOMY     ANKLE SURGERY     EYE SURGERY     Left hip replacement Left 2013   right hip replacement Right 2000   TONSILLECTOMY AND ADENOIDECTOMY  1943   TOTAL KNEE ARTHROPLASTY Right 02/23/2020   Procedure: TOTAL KNEE ARTHROPLASTY;  Surgeon: Gaynelle Arabian, MD;  Location: [facility];  Service: Orthopedics;  Laterality: Right;  63mn   TOTAL KNEE ARTHROPLASTY Left 04/18/2021   Procedure: TOTAL KNEE ARTHROPLASTY;  Surgeon: AGaynelle Arabian MD;  Location: [facility];  Service: Orthopedics;  Laterality: Left;   WISDOM TOOTH EXTRACTION      There were no vitals filed for this visit.   Subjective Assessment - 07/12/21 1014     Subjective Pt rpeorts her knee has been feeling well. She is still having some pain in her left hip laterally in area surrounfing greater trochanter.    Patient is accompained by: Family member    Pertinent History Pt reports surgery on 02/16/21 on the  Left knee. Reports knee pain of 4-5/10 and complains of mostly soreness. Pt has had history of bilateral THA and right TKA in addition to current TKA procedure. Pt reports she was provided with HEP but has not been able to copmlete to this point. Pt is taking 1 tramadol in the AM and at night and is taking asprin.    Limitations Standing;Walking;House hold activities    How long can you stand comfortably? 5- 8 min    How long can you walk comfortably? 5-8 min    Currently in Pain? No/denies    Pain Onset 1 to 4 weeks ago             Exercise/Activity Sets/Reps/Time/ Resistance Assistance Charge type Comments  Leg press  2x10 _0  #  There ex  Cues for LE placement and to maintain neutral hip alignment throughout   side step ups   x12  UUE There ex  Cues for UE assist and for LE placement   Step ups to airex pad and 4 inch step  2 X 10  U UE support   There ex To improve stability with transitions to various surfaces    Nustep - LE only X 6 min level 3 setup No Charge   For PROM/ARM  on the right knee  LAQ/ knee flexion 10 x 5 sec hold ea with 4# AW donned   Therex  To improve knee ext strength and knee flex ROM, increased resistance this session.   Supine heel slides   1 x 10 x 5 sec   Therex   Cues for proper muscle activation Towel under heel  Min A from PT for end range knee flexion. Last set measured at 109 degrees knee flexion         End range knee flexion and extension stretching and PROM, joint mobs in  various degrees of knee flexion ( A/P) to improve flexion ROM rade 2/3 mobs  X multiple minutes with end range holds   Manual    Rocks on 1/2 foam roller  2*12 UE support for balance  therex  To improve ankle strength and ROM.                ICE X4 min  No charge    Treatment Provided this session   Pt educated throughout session about proper posture and technique with exercises. Improved exercise technique, movement at target joints, use of target muscles after min to mod  verbal, visual, tactile cues.  Note: Portions of this document were prepared using Dragon voice recognition software and although reviewed may contain unintentional dictation errors in syntax, grammar, or spelling.                             PT Education - 07/12/21 1015     Education Details Exercise form and technique    Person(s) Educated Patient    Methods Explanation    Comprehension Verbalized understanding              PT Short Term Goals - 07/05/21 1057       PT SHORT TERM GOAL #1   Title Patient will be independent in home exercise program to improve strength/mobility for better functional independence with ADLs.    Baseline pt has initial HEP from hospital but not been able to complete to this point; 1/16: reports doing HEP regularly on days not in PT    Time 4    Period Weeks    Status Achieved    Target Date 05/19/21      PT SHORT TERM GOAL #2   Title Patient will increase FOTO score to equal to or greater than  54   to demonstrate statistically significant improvement in mobility and quality of life.    Baseline FOTO 49 at IE,63 on 2/28    Time 4    Period Weeks    Status Achieved               PT Long Term Goals - 07/05/21 1058       PT LONG TERM GOAL #1   Title Pt will improve Left knee ROM to 0-120 in order to facilatate smooth gait pattern and stair navigation    Baseline 9-60 on IE, 1/16: 5-85 degrees 2/28: 2 degree extension 107 degreed flexion    Time 12    Period Weeks    Status Partially Met    Target Date 07/14/21      PT LONG TERM GOAL #2   Title Patient (> 45 years old) will complete five times sit to stand test in < 15 seconds indicating an increased LE strength and improved balance.    Baseline 39.1, 1/16: 20.15 sec with BUE  on arm rests , 18.4 sec with UE on knees    Time 12    Period Weeks    Status New    Target Date 07/14/21      PT LONG TERM GOAL #3   Title Patient will increase six minute walk  test distance to >1000 for progression to community ambulator and improve gait ability    Baseline unable to test at eval, ambulating with walker; 1/16: 470 feet with SPC    Time 12    Period Weeks    Status Partially Met    Target Date 07/14/21      PT LONG TERM GOAL #4   Title Patient will increase 10 meter walk test to >1.75ms as to improve gait speed for better community ambulation and to reduce fall risk.    Baseline .374m 12/15 with RW, 1/16: 0.51 m/s without AD, .86 m/s on 2/28    Time 12    Period Weeks    Status New    Target Date 07/14/21      PT LONG TERM GOAL #5   Title Patient will be require no assist with ascend/descend 3 steps with step over gait pattern to facilitate safe and effecient entry to home    Baseline step to gait pattern at home    Time 10    Period Weeks    Status New    Target Date 06/30/21                   Plan - 07/12/21 1016     Clinical Impression Statement Pt presents with excellent motivation for completion of PT exercises.  Continued with functional lower extremity strength and endurance training as well as focus on left knee range of motion into endrange flexion.  Patient continuing to make good progress at this time but does continue to show limited flexion range of motion.  With squatting and leg press activities patient does continue to require cues to prevent hip internal rotation.  Patient will continue to benefit from skilled physical therapy intervention in order to improve her lower extremity strength, function, and left knee range of motion.    Personal Factors and Comorbidities Age    Examination-Activity Limitations Bed Mobility;Bend;Lift;Reach Overhead;Squat;Stairs;Stand    Examination-Participation Restrictions Driving;Community Activity;Meal Prep    Stability/Clinical Decision Making Stable/Uncomplicated    Rehab Potential Good    PT Frequency 2x / week    PT Duration 12 weeks    PT Treatment/Interventions ADLs/Self  Care Home Management;Aquatic Therapy;Electrical Stimulation;Moist Heat;DME Instruction;Gait training;Stair training;Functional mobility training;Therapeutic activities;Therapeutic exercise;Balance training;Neuromuscular re-education;Manual techniques;Manual lymph drainage;Compression bandaging;Passive range of motion;Joint Manipulations;Energy conservation;Dry needling;Taping;Vasopneumatic Device    PT Next Visit Plan Progress HEP, manual, stretching, balance, functional activities where appropriate    PT Home Exercise Plan handount provided, heel slides, quad sets, SLR, heel raises,    Consulted and Agree with Plan of Care Patient;Family member/caregiver             Patient will benefit from skilled therapeutic intervention in order to improve the following deficits and impairments:  Abnormal gait, Decreased activity tolerance, Decreased endurance, Decreased range of motion, Decreased strength, Improper body mechanics, Decreased balance, Difficulty walking, Decreased mobility  Visit Diagnosis: Abnormality of gait and mobility  Difficulty in walking, not elsewhere classified  History of knee replacement, total, left  Status post total left knee replacement     Problem List Patient Active Problem List   Diagnosis Date Noted   Primary osteoarthritis of left  knee 04/18/2021   Weight loss 10/25/2020   Osteoporosis 07/07/2020   Primary osteoarthritis of right knee 02/23/2020   Lumbar radiculopathy 09/27/2018   Osteoarthritis of knee 08/26/2018   Degeneration of lumbar intervertebral disc 08/20/2018   Bilateral chronic knee pain 06/06/2018   Hyperlipidemia    GERD (gastroesophageal reflux disease)    Frequent headaches    Arthritis     Particia Lather, PT 07/12/2021, 1:29 PM  [place] MAIN [facility] SERVICES [address] [place], [place], [address] Phone: [phone]   Fax:  [phone]  Name: Meagon Duskin MRN: [id number] Date of  Birth: 1934-07-11

## 2021-07-15 ENCOUNTER — Other Ambulatory Visit: Payer: Self-pay

## 2021-07-15 ENCOUNTER — Ambulatory Visit: Payer: Medicare Other | Admitting: Physical Therapy

## 2021-07-15 ENCOUNTER — Encounter: Payer: Self-pay | Admitting: Physical Therapy

## 2021-07-15 DIAGNOSIS — R262 Difficulty in walking, not elsewhere classified: Secondary | ICD-10-CM

## 2021-07-15 DIAGNOSIS — Z96652 Presence of left artificial knee joint: Secondary | ICD-10-CM

## 2021-07-15 DIAGNOSIS — R269 Unspecified abnormalities of gait and mobility: Secondary | ICD-10-CM | POA: Diagnosis not present

## 2021-07-15 NOTE — Therapy (Signed)
[place] MAIN [facility] SERVICES [address] [place], [place], [address] Phone: [phone]   Fax:  [phone]  Physical Therapy Treatment  Patient Details  Name: Sherry Lamb MRN: [id number] Date of Birth: [birth year] Referring Provider (PT): Gaynelle Arabian   Encounter Date: 07/15/2021   PT End of Session - 07/15/21 0812     Visit Number 22    Number of Visits 24    Date for PT Re-Evaluation 07/14/21    Authorization Type [facility]    Progress Note Due on Visit 52    PT Start Time 0815    PT Stop Time 0900    PT Time Calculation (min) 45 min    Equipment Utilized During Treatment Gait belt    Activity Tolerance Patient limited by pain    Behavior During Therapy WFL for tasks assessed/performed             Past Medical History:  Diagnosis Date   Arthritis    Frequent headaches    GERD (gastroesophageal reflux disease)    Hyperlipidemia    Pneumonia    PONV (postoperative nausea and vomiting)     Past Surgical History:  Procedure Laterality Date   ABDOMINAL HYSTERECTOMY     ANKLE SURGERY     EYE SURGERY     Left hip replacement Left 2013   right hip replacement Right 2000   TONSILLECTOMY AND ADENOIDECTOMY  1943   TOTAL KNEE ARTHROPLASTY Right 02/23/2020   Procedure: TOTAL KNEE ARTHROPLASTY;  Surgeon: Gaynelle Arabian, MD;  Location: [facility];  Service: Orthopedics;  Laterality: Right;  76mn   TOTAL KNEE ARTHROPLASTY Left 04/18/2021   Procedure: TOTAL KNEE ARTHROPLASTY;  Surgeon: AGaynelle Arabian MD;  Location: [facility];  Service: Orthopedics;  Laterality: Left;   WISDOM TOOTH EXTRACTION      There were no vitals filed for this visit.   Subjective Assessment - 07/15/21 0812     Subjective Pt rpeorts her knee has been feeling well. She is still having some pain in her left hip laterally in area surrounfing greater trochanter.    Pertinent History Pt reports surgery on 02/16/21 on the Left knee. Reports knee pain of 4-5/10 and  complains of mostly soreness. Pt has had history of bilateral THA and right TKA in addition to current TKA procedure. Pt reports she was provided with HEP but has not been able to copmlete to this point. Pt is taking 1 tramadol in the AM and at night and is taking asprin.    Limitations Standing;Walking;House hold activities    Currently in Pain? No/denies            Physical therapy treatment session today consisted of completing assessment of goals and administration of testing as demonstrated in flow sheet and goals section of this exam. Addition treatments may be found below.     Exercise/Activity Sets/Reps/Time/ Resistance Assistance Charge type Comments  Left knee contract relax for both antagonist and agonist in seated position to improve knee flexion ROM  6 x 5 sec holds with 30 sec end range flexion hold following each   Manual  Patient has complaints of hamstring area discomfort with end range flexion so addressed with with this activity today.  Some decrease in end range discomfort following  Nustep  Level 3 x 5 min   None                6MWT 1040 feet  therex Good equal gait pattern throughout  Supine heel slides   1 x 10 x 5 sec   Therex   Cues for proper muscle activation Towel under heel  Min A from PT for end range knee flexion. Last set measured at 109 degrees knee flexion   Stair navigation    Therex  Upper extremity assist for step over step gait pattern x4 steps  End range knee flexion and extension stretching and PROM, joint mobs in  various degrees of knee flexion ( A/P) to improve flexion ROM rade 2/3 mobs  X multiple minutes with end range holds   Manual  Range of motion following was 2-109 degrees                     ICE X4 min  No charge    Treatment Provided this session   Pt educated throughout session about proper posture and technique with exercises. Improved exercise technique, movement at target joints, use of target muscles after min to mod verbal,  visual, tactile cues.  Note: Portions of this document were prepared using Dragon voice recognition software and although reviewed may contain unintentional dictation errors in syntax, grammar, or spelling.                          PT Education - 07/15/21 0812     Education Details Progress and PT plan moving forward    Person(s) Educated Patient;Spouse    Methods Explanation    Comprehension Verbalized understanding              PT Short Term Goals - 07/05/21 1057       PT SHORT TERM GOAL #1   Title Patient will be independent in home exercise program to improve strength/mobility for better functional independence with ADLs.    Baseline pt has initial HEP from hospital but not been able to complete to this point; 1/16: reports doing HEP regularly on days not in PT    Time 4    Period Weeks    Status Achieved    Target Date 05/19/21      PT SHORT TERM GOAL #2   Title Patient will increase FOTO score to equal to or greater than  54   to demonstrate statistically significant improvement in mobility and quality of life.    Baseline FOTO 49 at IE,63 on 2/28    Time 4    Period Weeks    Status Achieved               PT Long Term Goals - 07/05/21 1058       PT LONG TERM GOAL #1   Title Pt will improve Left knee ROM to 0-120 in order to facilatate smooth gait pattern and stair navigation    Baseline 9-60 on IE, 1/16: 5-85 degrees 2/28: 2 degree extension 107 degreed flexion    Time 12    Period Weeks    Status Partially Met    Target Date 07/14/21      PT LONG TERM GOAL #2   Title Patient (> 85 years old) will complete five times sit to stand test in < 15 seconds indicating an increased LE strength and improved balance.    Baseline 39.1, 1/16: 20.15 sec with BUE on arm rests , 18.4 sec with UE on knees    Time 12    Period Weeks    Status New    Target Date 07/14/21  PT LONG TERM GOAL #3   Title Patient will increase six minute walk  test distance to >1000 for progression to community ambulator and improve gait ability    Baseline unable to test at eval, ambulating with walker; 1/16: 470 feet with SPC    Time 12    Period Weeks    Status Partially Met    Target Date 07/14/21      PT LONG TERM GOAL #4   Title Patient will increase 10 meter walk test to >1.38ms as to improve gait speed for better community ambulation and to reduce fall risk.    Baseline .348m 12/15 with RW, 1/16: 0.51 m/s without AD, .86 m/s on 2/28    Time 12    Period Weeks    Status New    Target Date 07/14/21      PT LONG TERM GOAL #5   Title Patient will be require no assist with ascend/descend 3 steps with step over gait pattern to facilitate safe and effecient entry to home    Baseline step to gait pattern at home    Time 10    Period Weeks    Status New    Target Date 06/30/21                   Plan - 07/15/21 0813     Clinical Impression Statement Pt presents to physical therapy session for re certification note. Goals assessed this date, pt continues to show good progress toward her goals but still lacks full flexion range of motion on the involved side for full compelting in daily activities.    Personal Factors and Comorbidities Age    Examination-Activity Limitations Bed Mobility;Bend;Lift;Reach Overhead;Squat;Stairs;Stand    Examination-Participation Restrictions Driving;Community Activity;Meal Prep    Stability/Clinical Decision Making Stable/Uncomplicated    Rehab Potential Good    PT Frequency 2x / week    PT Duration 12 weeks    PT Treatment/Interventions ADLs/Self Care Home Management;Aquatic Therapy;Electrical Stimulation;Moist Heat;DME Instruction;Gait training;Stair training;Functional mobility training;Therapeutic activities;Therapeutic exercise;Balance training;Neuromuscular re-education;Manual techniques;Manual lymph drainage;Compression bandaging;Passive range of motion;Joint Manipulations;Energy  conservation;Dry needling;Taping;Vasopneumatic Device    PT Next Visit Plan Progress HEP, manual, stretching, balance, functional activities where appropriate    PT Home Exercise Plan handount provided, heel slides, quad sets, SLR, heel raises,    Consulted and Agree with Plan of Care Patient;Family member/caregiver             Patient will benefit from skilled therapeutic intervention in order to improve the following deficits and impairments:  Abnormal gait, Decreased activity tolerance, Decreased endurance, Decreased range of motion, Decreased strength, Improper body mechanics, Decreased balance, Difficulty walking, Decreased mobility  Visit Diagnosis: Abnormality of gait and mobility  Difficulty in walking, not elsewhere classified  History of knee replacement, total, left  Status post total left knee replacement     Problem List Patient Active Problem List   Diagnosis Date Noted   Primary osteoarthritis of left knee 04/18/2021   Weight loss 10/25/2020   Osteoporosis 07/07/2020   Primary osteoarthritis of right knee 02/23/2020   Lumbar radiculopathy 09/27/2018   Osteoarthritis of knee 08/26/2018   Degeneration of lumbar intervertebral disc 08/20/2018   Bilateral chronic knee pain 06/06/2018   Hyperlipidemia    GERD (gastroesophageal reflux disease)    Frequent headaches    Arthritis     ChParticia LatherPT 07/15/2021, 8:14 AM  [place] [id number]  Phone: [phone]   Fax:  [phone]  Name: Sherry Lamb MRN: [id number] Date of Birth: 1934/10/29

## 2021-07-19 ENCOUNTER — Ambulatory Visit: Payer: Medicare Other | Admitting: Physical Therapy

## 2021-07-19 ENCOUNTER — Other Ambulatory Visit: Payer: Self-pay

## 2021-07-19 DIAGNOSIS — R269 Unspecified abnormalities of gait and mobility: Secondary | ICD-10-CM | POA: Diagnosis not present

## 2021-07-19 DIAGNOSIS — Z96652 Presence of left artificial knee joint: Secondary | ICD-10-CM

## 2021-07-19 DIAGNOSIS — R262 Difficulty in walking, not elsewhere classified: Secondary | ICD-10-CM

## 2021-07-19 NOTE — Therapy (Signed)
[facility] ?[place], [place], [address] ?Phone: [phone]   Fax:  [phone] ? ?Physical Therapy Treatment ? ?Patient Details  ?Name: Sherry Lamb ?MRN: [id number] ?Date of Birth: Jun 07, 1934 ?Referring Provider (PT): Gaynelle Arabian ? ? ?Encounter Date: 07/19/2021 ? ? PT End of Session - 07/19/21 1356   ? ? Visit Number 23   ? Number of Visits 24   ? Date for PT Re-Evaluation 09/09/21   ? Authorization Type [facility] [employer]   ? Progress Note Due on Visit 30   ? PT Start Time 5697   ? PT Stop Time 1430   ? PT Time Calculation (min) 41 min   ? Equipment Utilized During Treatment Gait belt   ? Activity Tolerance Patient limited by pain   ? Behavior During Therapy [facility] for tasks assessed/performed   ? ?  ?  ? ?  ? ? ?Past Medical History:  ?Diagnosis Date  ? Arthritis   ? Frequent headaches   ? GERD (gastroesophageal reflux disease)   ? Hyperlipidemia   ? Pneumonia   ? PONV (postoperative nausea and vomiting)   ? ? ?Past Surgical History:  ?Procedure Laterality Date  ? ABDOMINAL HYSTERECTOMY    ? ANKLE SURGERY    ? EYE SURGERY    ? Left hip replacement Left 2013  ? right hip replacement Right 2000  ? TONSILLECTOMY AND ADENOIDECTOMY  1943  ? TOTAL KNEE ARTHROPLASTY Right 02/23/2020  ? Procedure: TOTAL KNEE ARTHROPLASTY;  Surgeon: Gaynelle Arabian, MD;  Location: [facility];  Service: Orthopedics;  Laterality: Right;  16mn  ? TOTAL KNEE ARTHROPLASTY Left 04/18/2021  ? Procedure: TOTAL KNEE ARTHROPLASTY;  Surgeon: AGaynelle Arabian MD;  Location: [facility];  Service: Orthopedics;  Laterality: Left;  ? WISDOM TOOTH EXTRACTION    ? ? ?There were no vitals filed for this visit. ? ? Subjective Assessment - 07/19/21 1352   ? ? Subjective Pt reports some soreness following the last physical therpay session that lasted for several days but is alleviated now.   ? Pertinent History Pt reports surgery on 02/16/21 on the Left knee. Reports knee pain of 4-5/10 and complains of mostly  soreness. Pt has had history of bilateral THA and right TKA in addition to current TKA procedure. Pt reports she was provided with HEP but has not been able to copmlete to this point. Pt is taking 1 tramadol in the AM and at night and is taking asprin.   ? Limitations Standing;Walking;House hold activities   ? ?  ?  ? ?  ? ? ? ? ? ? ?Exercise/Activity Sets/Reps/Time/ Resistance Assistance Charge type Comments  ?Leg press  1*10@ 40 # ?1*10@ 55# ?SL 1*10@25 #  There ex  Cues for LE placement and to maintain neutral hip alignment throughout   ?side step ups ? ? x12  ?UUE There ex  Cues for minimal upper extremity test as well as activation of proper muscles.  ?      ?Nustep - LE only X 6 min level 3 setup No Charge   For PROM/ARM on the right knee  ?LAQ/ knee flexion 10 x 5 sec hold ea with 3# AW donned   Therex  To improve knee ext strength and knee flex ROM, increased resistance this session.   ?Supine heel slides   ?1 x 10 x 5 sec   Therex   Cues for proper muscle activation ?Towel under heel  ?Min A from PT for end  range knee flexion. Last set measured at 109 degrees knee flexion   ?      ?End range knee flexion and extension stretching and PROM, joint mobs in  various degrees of knee flexion ( A/P) to improve flexion ROM grade 2/3 mobs  X multiple minutes with end range holds   Manual    ?Soft tissue mobilization to left medial hamstring insertion area patient was having some discomfort with endrange flexion. Times several minutes  Manual Improved discomfort with palpation to this area following manual therapy  ?      ?      ? ?ICE X4 min  No charge    ?Treatment Provided this session  ? ?Pt educated throughout session about proper posture and technique with exercises. Improved exercise technique, movement at target joints, use of target muscles after min to mod verbal, visual, tactile cues. ? ?Note: Portions of this document were prepared using Dragon voice recognition software and although reviewed may contain  unintentional dictation errors in syntax, grammar, or spelling. ? ? ? ? ? ? ? ? ? ? ? ? ? ? ? ? ? ? ? ? ? ? ? ? ? PT Education - 07/19/21 1353   ? ? Education Details Exercise form and technique   ? Person(s) Educated Patient   ? Methods Explanation   ? Comprehension Verbalized understanding   ? ?  ?  ? ?  ? ? ? PT Short Term Goals - 07/05/21 1057   ? ?  ? PT SHORT TERM GOAL #1  ? Title Patient will be independent in home exercise program to improve strength/mobility for better functional independence with ADLs.   ? Baseline pt has initial HEP from hospital but not been able to complete to this point; 1/16: reports doing HEP regularly on days not in PT   ? Time 4   ? Period Weeks   ? Status Achieved   ? Target Date 05/19/21   ?  ? PT SHORT TERM GOAL #2  ? Title Patient will increase FOTO score to equal to or greater than  54   to demonstrate statistically significant improvement in mobility and quality of life.   ? Baseline FOTO 49 at IE,63 on 2/28   ? Time 4   ? Period Weeks   ? Status Achieved   ? ?  ?  ? ?  ? ? ? ? PT Long Term Goals - 07/15/21 0829   ? ?  ? PT LONG TERM GOAL #1  ? Title Pt will improve Left knee ROM to 0-120 in order to facilatate smooth gait pattern and stair navigation   ? Baseline 9-60 on IE, 1/16: 5-85 degrees 2/28: 2 degree extension 107 degreed flexion 07/15/21: 2-109 PROM   ? Time 12   ? Period Weeks   ? Status Partially Met   ? Target Date 07/14/21   ?  ? PT LONG TERM GOAL #2  ? Title Patient (> 34 years old) will complete five times sit to stand test in < 15 seconds indicating an increased LE strength and improved balance.   ? Baseline 39.1, 1/16: 20.15 sec with BUE on arm rests , 18.4 sec with UE on knees 17.05 on 07/15/21   ? Time 12   ? Period Weeks   ? Status On-going   ? Target Date 09/09/21   ?  ? PT LONG TERM GOAL #3  ? Title Patient will increase six minute walk test distance to >1000  for progression to community ambulator and improve gait ability   ? Baseline unable to test at  eval, ambulating with walker; 1/16: 470 feet with SPC 1040 feet without AD   ? Time 12   ? Period Weeks   ? Status Achieved   ? Target Date --   ?  ? PT LONG TERM GOAL #4  ? Title Patient will increase 10 meter walk test to >1.40ms as to improve gait speed for better community ambulation and to reduce fall risk.   ? Baseline .353m 12/15 with RW, 1/16: 0.51 m/s without AD, .86 m/s on 2/28, .96 m/s on 07/15/21   ? Time 12   ? Period Weeks   ? Status On-going   ? Target Date 09/09/21   ?  ? PT LONG TERM GOAL #5  ? Title Patient will be require no assist with ascend/descend 3 steps with step over gait pattern to facilitate safe and effecient entry to home   ? Baseline step to gait pattern at home, performs 4 steps with stepover pattern with UE on rail this date.   ? Time 10   ? Period Weeks   ? Status Achieved   ? Target Date 06/30/21   ? ?  ?  ? ?  ? ? ? ? ? ? ? ? Plan - 07/19/21 1400   ? ? Clinical Impression Statement Patient presents to physical therapy with excellent motivation for completion of program.  Patient did report some soreness following manual therapy last session as a result muscle energy techniques were not utilized today in order to improve patient tolerance for program.  Patient did demonstrate improved knee range of motion to 110 degrees of knee flexion this date, however, this was following several activities to improve left knee range of motion.  Patient will continue to benefit from skilled physical therapy intervention in order to improve her lower extremity strength, and left knee range of motion.   ? Personal Factors and Comorbidities Age   ? Examination-Activity Limitations Bed Mobility;Bend;Lift;Reach Overhead;Squat;Stairs;Stand   ? Examination-Participation Restrictions Driving;Community Activity;Meal Prep   ? Stability/Clinical Decision Making Stable/Uncomplicated   ? Rehab Potential Good   ? PT Frequency 2x / week   ? PT Duration 12 weeks   ? PT Treatment/Interventions ADLs/Self Care Home  Management;Aquatic Therapy;Electrical Stimulation;Moist Heat;DME Instruction;Gait training;Stair training;Functional mobility training;Therapeutic activities;Therapeutic exercise;Balance training;Neurom

## 2021-07-21 ENCOUNTER — Encounter: Payer: Medicare Other | Admitting: Physical Therapy

## 2021-07-26 ENCOUNTER — Other Ambulatory Visit: Payer: Self-pay

## 2021-07-26 ENCOUNTER — Ambulatory Visit: Payer: Medicare Other | Admitting: Physical Therapy

## 2021-07-26 DIAGNOSIS — R269 Unspecified abnormalities of gait and mobility: Secondary | ICD-10-CM

## 2021-07-26 DIAGNOSIS — Z96652 Presence of left artificial knee joint: Secondary | ICD-10-CM

## 2021-07-26 DIAGNOSIS — R262 Difficulty in walking, not elsewhere classified: Secondary | ICD-10-CM

## 2021-07-26 NOTE — Therapy (Signed)
[place] [facility] ?[place], [place], [address] ?Phone: [phone]   Fax:  [phone] ? ?Physical Therapy Treatment ? ?Patient Details  ?Name: Sherry Lamb ?MRN: [id number] ?Date of Birth: Dec 15, 1934 ?Referring Provider (PT): Gaynelle Arabian ? ? ?Encounter Date: 07/26/2021 ? ? PT End of Session - 07/26/21 1352   ? ? Visit Number 24   ? Number of Visits 32   ? Date for PT Re-Evaluation 09/09/21   ? Authorization Type [facility] [employer]   ? Progress Note Due on Visit 30   ? PT Start Time 1348   ? PT Stop Time 1430   ? PT Time Calculation (min) 42 min   ? Equipment Utilized During Treatment Gait belt   ? Activity Tolerance Patient limited by pain   ? Behavior During Therapy [facility] for tasks assessed/performed   ? ?  ?  ? ?  ? ? ?Past Medical History:  ?Diagnosis Date  ? Arthritis   ? Frequent headaches   ? GERD (gastroesophageal reflux disease)   ? Hyperlipidemia   ? Pneumonia   ? PONV (postoperative nausea and vomiting)   ? ? ?Past Surgical History:  ?Procedure Laterality Date  ? ABDOMINAL HYSTERECTOMY    ? ANKLE SURGERY    ? EYE SURGERY    ? Left hip replacement Left 2013  ? right hip replacement Right 2000  ? TONSILLECTOMY AND ADENOIDECTOMY  1943  ? TOTAL KNEE ARTHROPLASTY Right 02/23/2020  ? Procedure: TOTAL KNEE ARTHROPLASTY;  Surgeon: Gaynelle Arabian, MD;  Location: [facility];  Service: Orthopedics;  Laterality: Right;  56mn  ? TOTAL KNEE ARTHROPLASTY Left 04/18/2021  ? Procedure: TOTAL KNEE ARTHROPLASTY;  Surgeon: AGaynelle Arabian MD;  Location: [facility];  Service: Orthopedics;  Laterality: Left;  ? WISDOM TOOTH EXTRACTION    ? ? ?There were no vitals filed for this visit. ? ? Subjective Assessment - 07/26/21 1351   ? ? Subjective Patient reports increased pain in the left hip over the last night and continued today.  Patient reports no pain in the left knee at this time and has been doing well with walking up to 1 mile.   ? Pertinent History Pt reports surgery on  02/16/21 on the Left knee. Reports knee pain of 4-5/10 and complains of mostly soreness. Pt has had history of bilateral THA and right TKA in addition to current TKA procedure. Pt reports she was provided with HEP but has not been able to copmlete to this point. Pt is taking 1 tramadol in the AM and at night and is taking asprin.   ? Limitations Standing;Walking;House hold activities   ? Pain Score 5    ? Pain Location Hip   ? Pain Orientation Left   ? Pain Descriptors / Indicators Sore   ? Pain Type Chronic pain   ? Pain Onset More than a month ago   ? Pain Frequency Intermittent   ? Pain Relieving Factors Pain meds   ? Multiple Pain Sites No   ? ?  ?  ? ?  ? ? ? ? ? ? ? ? ?Exercise/Activity Sets/Reps/Time/ Resistance Assistance Charge type Comments  ?Leg press  1*10@ 40 # ?1*10@ 55# ?SL 1*10@25 # Min A with SL There ex  Cues for LE placement and to maintain neutral hip alignment throughout  ?Band around knees for DL for cues for hip alignment  ?Bridge with band around knees ?Side-lying hip abduction on the left ?Side-lying left clamshell 1x10 of  each  TherEX Cues for proper hip alignment throughout.  Handout provided for continuation with the use for home exercises  ?      ?Nustep - LE only X 6 min level 3 setup f For PROM/AROM on the right knee ?Consistent cues for proper alignment of knees bilaterally to prevent excessive hip internal rotation  ?      ?      ?      ?End range knee flexion and extension stretching and PROM, joint mobs in  various degrees of knee flexion ( A/P) to improve flexion ROM grade 2/3 mobs  X multiple minutes with end range holds   Manual  Knee flexion range of motion measured at 114 degrees this session indicating greatest angle of knee flexion since start of therapy.  Patient approaching goal for this of 120 degrees of knee flexion.  ?Soft tissue mobilization and instrument assisted soft tissue mobilization of the area surrounding the left greater trochanter including the piriformis,  gluteus medius and minimus, and IT band and vastus lateralis. Times several minutes  Manual Significant discomfort with palpation to various muscle bellies along this area.  We will continue to work on these in future sessions for alleviation of pain in her left hip.  ?      ?      ? ?Treatment Provided this session  ? ?Pt educated throughout session about proper posture and technique with exercises. Improved exercise technique, movement at target joints, use of target muscles after min to mod verbal, visual, tactile cues. ? ?Note: Portions of this document were prepared using Dragon voice recognition software and although reviewed may contain unintentional dictation errors in syntax, grammar, or spelling. ? ? ? ? ? ? ? ? ? ? ? ? ? ? ? ? ? ? ? ? ? ? ? ? PT Education - 07/26/21 1521   ? ? Education Details Hip exercise handout including clamshells, banded bridges, and left hip side-lying abduction   ? Person(s) Educated Patient   ? Methods Explanation;Demonstration;Handout   ? Comprehension Verbalized understanding;Returned demonstration;Verbal cues required;Tactile cues required   ? ?  ?  ? ?  ? ? ? PT Short Term Goals - 07/05/21 1057   ? ?  ? PT SHORT TERM GOAL #1  ? Title Patient will be independent in home exercise program to improve strength/mobility for better functional independence with ADLs.   ? Baseline pt has initial HEP from hospital but not been able to complete to this point; 1/16: reports doing HEP regularly on days not in PT   ? Time 4   ? Period Weeks   ? Status Achieved   ? Target Date 05/19/21   ?  ? PT SHORT TERM GOAL #2  ? Title Patient will increase FOTO score to equal to or greater than  54   to demonstrate statistically significant improvement in mobility and quality of life.   ? Baseline FOTO 49 at IE,63 on 2/28   ? Time 4   ? Period Weeks   ? Status Achieved   ? ?  ?  ? ?  ? ? ? ? PT Long Term Goals - 07/15/21 0829   ? ?  ? PT LONG TERM GOAL #1  ? Title Pt will improve Left knee ROM to 0-120  in order to facilatate smooth gait pattern and stair navigation   ? Baseline 9-60 on IE, 1/16: 5-85 degrees 2/28: 2 degree extension 107 degreed flexion 07/15/21: 2-109 PROM   ?  Time 12   ? Period Weeks   ? Status Partially Met   ? Target Date 07/14/21   ?  ? PT LONG TERM GOAL #2  ? Title Patient (> 58 years old) will complete five times sit to stand test in < 15 seconds indicating an increased LE strength and improved balance.   ? Baseline 39.1, 1/16: 20.15 sec with BUE on arm rests , 18.4 sec with UE on knees 17.05 on 07/15/21   ? Time 12   ? Period Weeks   ? Status On-going   ? Target Date 09/09/21   ?  ? PT LONG TERM GOAL #3  ? Title Patient will increase six minute walk test distance to >1000 for progression to community ambulator and improve gait ability   ? Baseline unable to test at eval, ambulating with walker; 1/16: 470 feet with SPC 1040 feet without AD   ? Time 12   ? Period Weeks   ? Status Achieved   ? Target Date --   ?  ? PT LONG TERM GOAL #4  ? Title Patient will increase 10 meter walk test to >1.42ms as to improve gait speed for better community ambulation and to reduce fall risk.   ? Baseline .374m 12/15 with RW, 1/16: 0.51 m/s without AD, .86 m/s on 2/28, .96 m/s on 07/15/21   ? Time 12   ? Period Weeks   ? Status On-going   ? Target Date 09/09/21   ?  ? PT LONG TERM GOAL #5  ? Title Patient will be require no assist with ascend/descend 3 steps with step over gait pattern to facilitate safe and effecient entry to home   ? Baseline step to gait pattern at home, performs 4 steps with stepover pattern with UE on rail this date.   ? Time 10   ? Period Weeks   ? Status Achieved   ? Target Date 06/30/21   ? ?  ?  ? ?  ? ? ? ? ? ? ? ? Plan - 07/26/21 1352   ? ? Clinical Impression Statement Patient presents to physical therapy with excellent motivation for completion of program.  Patient assessed for hip pain this session and has some significant weakness in the left hip as well as tenderness to palpation  along the greater trochanter, along IT band, along piriformis muscle, as well as hip abductors on the left side.  Patient provided with several home exercises to address these and manual therapies provided

## 2021-07-27 ENCOUNTER — Ambulatory Visit: Payer: Medicare Other | Admitting: Endocrinology

## 2021-07-28 ENCOUNTER — Encounter: Payer: Medicare Other | Admitting: Physical Therapy

## 2021-08-02 ENCOUNTER — Other Ambulatory Visit: Payer: Self-pay

## 2021-08-02 ENCOUNTER — Ambulatory Visit: Payer: Medicare Other | Admitting: Physical Therapy

## 2021-08-02 DIAGNOSIS — R269 Unspecified abnormalities of gait and mobility: Secondary | ICD-10-CM

## 2021-08-02 DIAGNOSIS — Z96652 Presence of left artificial knee joint: Secondary | ICD-10-CM

## 2021-08-02 NOTE — Therapy (Signed)
[place] ?[facility] ?[place], [place], [address] ?Phone: [phone]   Fax:  [phone] ? ?Physical Therapy Treatment ? ?Patient Details  ?Name: Sherry Lamb ?MRN: [id number] ?Date of Birth: 1935-01-13 ?Referring Provider (PT): Gaynelle Arabian ? ? ?Encounter Date: 08/02/2021 ? ? PT End of Session - 08/02/21 1353   ? ? Visit Number 25   ? Number of Visits 32   ? Date for PT Re-Evaluation 09/09/21   ? Authorization Type [facility] [employer]   ? Progress Note Due on Visit 30   ? PT Start Time 1345   ? PT Stop Time 1430   ? PT Time Calculation (min) 45 min   ? Equipment Utilized During Treatment Gait belt   ? Activity Tolerance Patient limited by pain   ? Behavior During Therapy [facility] for tasks assessed/performed   ? ?  ?  ? ?  ? ? ?Past Medical History:  ?Diagnosis Date  ? Arthritis   ? Frequent headaches   ? GERD (gastroesophageal reflux disease)   ? Hyperlipidemia   ? Pneumonia   ? PONV (postoperative nausea and vomiting)   ? ? ?Past Surgical History:  ?Procedure Laterality Date  ? ABDOMINAL HYSTERECTOMY    ? ANKLE SURGERY    ? EYE SURGERY    ? Left hip replacement Left 2013  ? right hip replacement Right 2000  ? TONSILLECTOMY AND ADENOIDECTOMY  1943  ? TOTAL KNEE ARTHROPLASTY Right 02/23/2020  ? Procedure: TOTAL KNEE ARTHROPLASTY;  Surgeon: Gaynelle Arabian, MD;  Location: [facility];  Service: Orthopedics;  Laterality: Right;  81mn  ? TOTAL KNEE ARTHROPLASTY Left 04/18/2021  ? Procedure: TOTAL KNEE ARTHROPLASTY;  Surgeon: AGaynelle Arabian MD;  Location: [facility];  Service: Orthopedics;  Laterality: Left;  ? WISDOM TOOTH EXTRACTION    ? ? ?There were no vitals filed for this visit. ? ? Subjective Assessment - 08/02/21 1351   ? ? Subjective Pt reports her hip felt much better following the previous PT session. Pt reports ability to walk prolonged distances with improved ease.  Patient reports her hip pain was improved but has since returned yesterday but is not as severe as it  has been.   ? Pertinent History Pt reports surgery on 02/16/21 on the Left knee. Reports knee pain of 4-5/10 and complains of mostly soreness. Pt has had history of bilateral THA and right TKA in addition to current TKA procedure. Pt reports she was provided with HEP but has not been able to copmlete to this point. Pt is taking 1 tramadol in the AM and at night and is taking asprin.   ? Limitations Standing;Walking;House hold activities   ? Currently in Pain? No/denies   ? Pain Onset More than a month ago   ? ?  ?  ? ?  ? ? ? ? ? ? ? ?Exercise/Activity Sets/Reps/Time/ Resistance Assistance Charge type Comments  ?Leg press  1*10@ 40 # ?1*10@ 55# ?SL 1*10_0 # Min A with SL There ex  Cues for LE placement and to maintain neutral hip alignment throughout  ?Band around knees for DL for cues for hip alignment  ?Bridge with band around knees ?Side-lying hip abduction on the left ?Side-lying left clamshell 2x10 of each  TherEX Cues for proper hip alignment throughout.  Handout provided for continuation with the use for home exercises ?Pt reports not sure if she is completing HEP correctly, Pt required cues and instruction for proper completion in her bed at home.   ?      ?  Nustep - LE only X 6 min level 3 setup No Charge  For PROM/AROM on the right knee ?Consistent cues for proper alignment of knees bilaterally to prevent excessive hip internal rotation  ?      ?      ?      ?End range knee flexion and extension stretching and PROM, joint mobs in  various degrees of knee flexion ( A/P) to improve flexion ROM grade 2/3 mobs  X multiple minutes with end range holds   Manual  Knee flexion range of motion measured at 114 degrees this session indicating greatest angle of knee flexion since start of therapy.  Patient approaching goal for this of 120 degrees of knee flexion.  ?Soft tissue mobilization and instrument assisted soft tissue mobilization of the area surrounding the left greater trochanter including the piriformis,  gluteus medius and minimus, and IT band and vastus lateralis. Times several minutes  Manual Significant discomfort with palpation to various muscle bellies along this area.  We will continue to work on these in future sessions for alleviation of pain in her left hip.  ?      ?      ? ?Treatment Provided this session  ? ?Pt educated throughout session about proper posture and technique with exercises. Improved exercise technique, movement at target joints, use of target muscles after min to mod verbal, visual, tactile cues. ? ?Note: Portions of this document were prepared using Dragon voice recognition software and although reviewed may contain unintentional dictation errors in syntax, grammar, or spelling. ? ? ? ? ? ? ? ? ? ? ? ? ? ? ? ? ? ? ? ? ? ? ? ? ? ? PT Education - 08/02/21 1352   ? ? Education Details Exercise form and technique   ? Person(s) Educated Patient   ? Methods Explanation;Demonstration;Handout   ? Comprehension Verbalized understanding;Returned demonstration;Verbal cues required   ? ?  ?  ? ?  ? ? ? PT Short Term Goals - 07/05/21 1057   ? ?  ? PT SHORT TERM GOAL #1  ? Title Patient will be independent in home exercise program to improve strength/mobility for better functional independence with ADLs.   ? Baseline pt has initial HEP from hospital but not been able to complete to this point; 1/16: reports doing HEP regularly on days not in PT   ? Time 4   ? Period Weeks   ? Status Achieved   ? Target Date 05/19/21   ?  ? PT SHORT TERM GOAL #2  ? Title Patient will increase FOTO score to equal to or greater than  54   to demonstrate statistically significant improvement in mobility and quality of life.   ? Baseline FOTO 49 at IE,63 on 2/28   ? Time 4   ? Period Weeks   ? Status Achieved   ? ?  ?  ? ?  ? ? ? ? PT Long Term Goals - 07/15/21 0829   ? ?  ? PT LONG TERM GOAL #1  ? Title Pt will improve Left knee ROM to 0-120 in order to facilatate smooth gait pattern and stair navigation   ? Baseline 9-60  on IE, 1/16: 5-85 degrees 2/28: 2 degree extension 107 degreed flexion 07/15/21: 2-109 PROM   ? Time 12   ? Period Weeks   ? Status Partially Met   ? Target Date 07/14/21   ?  ? PT LONG TERM GOAL #2  ?  Title Patient (> 34 years old) will complete five times sit to stand test in < 15 seconds indicating an increased LE strength and improved balance.   ? Baseline 39.1, 1/16: 20.15 sec with BUE on arm rests , 18.4 sec with UE on knees 17.05 on 07/15/21   ? Time 12   ? Period Weeks   ? Status On-going   ? Target Date 09/09/21   ?  ? PT LONG TERM GOAL #3  ? Title Patient will increase six minute walk test distance to >1000 for progression to community ambulator and improve gait ability   ? Baseline unable to test at eval, ambulating with walker; 1/16: 470 feet with SPC 1040 feet without AD   ? Time 12   ? Period Weeks   ? Status Achieved   ? Target Date --   ?  ? PT LONG TERM GOAL #4  ? Title Patient will increase 10 meter walk test to >1.74ms as to improve gait speed for better community ambulation and to reduce fall risk.   ? Baseline .332m 12/15 with RW, 1/16: 0.51 m/s without AD, .86 m/s on 2/28, .96 m/s on 07/15/21   ? Time 12   ? Period Weeks   ? Status On-going   ? Target Date 09/09/21   ?  ? PT LONG TERM GOAL #5  ? Title Patient will be require no assist with ascend/descend 3 steps with step over gait pattern to facilitate safe and effecient entry to home   ? Baseline step to gait pattern at home, performs 4 steps with stepover pattern with UE on rail this date.   ? Time 10   ? Period Weeks   ? Status Achieved   ? Target Date 06/30/21   ? ?  ?  ? ?  ? ? ? ? ? ? ? ? Plan - 08/02/21 1354   ? ? Clinical Impression Statement Patient presents to physical therapy with excellent motivation for completion of program.  Continued with soft tissue mobilization and hip strengthening exercises in the left side to improve pain along left hip.  Patient reeducated regarding several home exercises to address these and manual  therapies provided in order to improve pain along this area, patient was also instructed in massage instrument she may be able to purchase that will improve her pain such as generic massage stick. This may be resi

## 2021-08-04 ENCOUNTER — Ambulatory Visit: Payer: Medicare Other | Admitting: Physical Therapy

## 2021-08-08 ENCOUNTER — Telehealth: Payer: Self-pay | Admitting: Family Medicine

## 2021-08-08 MED ORDER — ROSUVASTATIN CALCIUM 5 MG PO TABS: 5.0000 mg | ORAL_TABLET | Freq: Every day | ORAL | 0 refills | Status: DC

## 2021-08-08 NOTE — Telephone Encounter (Signed)
[facility]  

## 2021-08-08 NOTE — Telephone Encounter (Signed)
?  Encourage patient to contact the pharmacy for refills or they can request refills through [facility] ? ?LAST APPOINTMENT DATE:  Please schedule appointment if longer than 1 year ? ?NEXT APPOINTMENT DATE:9.18.23 ? ?MEDICATION:rosuvastatin (CRESTOR) 5 MG tablet ? ?Is the patient out of medication? yes ? ?[id number] [address] [place] [facility] [address](Ph: [phone] ? ?Let patient know to contact pharmacy at the end of the day to make sure medication is ready. ? ?Please notify patient to allow 48-72 hours to process ? ?CLINICAL FILLS OUT ALL BELOW:  ? ?LAST REFILL: ? ?QTY: ? ?REFILL DATE: ? ? ? ?OTHER COMMENTS:  ? ? ?Okay for refill? ? ?Please advise ? ? ?  ?

## 2021-08-09 ENCOUNTER — Ambulatory Visit: Payer: Medicare Other | Admitting: Physical Therapy

## 2021-08-11 ENCOUNTER — Ambulatory Visit: Payer: Medicare Other | Admitting: Physical Therapy

## 2021-08-11 MED ORDER — ROSUVASTATIN CALCIUM 5 MG PO TABS: 5.0000 mg | ORAL_TABLET | Freq: Every day | ORAL | 1 refills | Status: DC

## 2021-08-11 NOTE — Telephone Encounter (Signed)
Patient called back states that she was called in refill to local for 30 days she has picked up but should have been to mail order for 90. Would like script sent to mail order so there is no delay in getting when due next month.  ?

## 2021-08-11 NOTE — Addendum Note (Signed)
Addended by: Loreen Freud on: 08/11/2021 02:36 PM ? ? Modules accepted: Orders ? ?

## 2021-08-11 NOTE — Telephone Encounter (Signed)
Rx sent to mail order

## 2021-08-16 ENCOUNTER — Ambulatory Visit: Payer: Medicare Other | Admitting: Physical Therapy

## 2021-08-16 ENCOUNTER — Ambulatory Visit: Payer: Self-pay | Admitting: Physical Therapy

## 2021-08-16 DIAGNOSIS — R269 Unspecified abnormalities of gait and mobility: Secondary | ICD-10-CM | POA: Insufficient documentation

## 2021-08-16 DIAGNOSIS — Z96652 Presence of left artificial knee joint: Secondary | ICD-10-CM | POA: Insufficient documentation

## 2021-08-16 DIAGNOSIS — R262 Difficulty in walking, not elsewhere classified: Secondary | ICD-10-CM | POA: Insufficient documentation

## 2021-08-16 NOTE — Therapy (Signed)
[place] ?[facility] ?[place], [place], [address] ?Phone: [phone]   Fax:  [phone] ? ?Patient Details  ?Name: Sherry Lamb ?MRN: [id number] ?Date of Birth: 18-Jan-1935 ?Referring Provider:  No ref. provider found ? ?Encounter Date: 08/16/2021 ?Patient to be discharged from skilled physical therapy interventions.  Patient has nearly met all her goals and her physician who performed her total knee replacement told her she no longer needed to complete physical therapy services.  Patient very pleased with her progress and care throughout session.  Patient instructed she could reach out to our clinic for further instructions if needed. ? ?Particia Lather, PT ?08/16/2021, 2:13 PM ? ?[place] ?[facility] ?[place], [place], [address] ?Phone: [phone]   Fax:  [phone] ?

## 2021-08-18 ENCOUNTER — Ambulatory Visit: Payer: Medicare Other | Admitting: Physical Therapy

## 2021-08-23 ENCOUNTER — Ambulatory Visit: Payer: Medicare Other | Admitting: Physical Therapy

## 2021-08-25 ENCOUNTER — Ambulatory Visit: Payer: Medicare Other | Admitting: Physical Therapy

## 2021-08-31 ENCOUNTER — Ambulatory Visit: Payer: Medicare Other | Admitting: Podiatry

## 2021-08-31 DIAGNOSIS — M79675 Pain in left toe(s): Secondary | ICD-10-CM

## 2021-08-31 DIAGNOSIS — B351 Tinea unguium: Secondary | ICD-10-CM

## 2021-08-31 DIAGNOSIS — M79674 Pain in right toe(s): Secondary | ICD-10-CM | POA: Diagnosis not present

## 2021-08-31 NOTE — Progress Notes (Signed)
?  Subjective:  ?Patient ID: Sherry Lamb, female    DOB: 04-25-1935,  MRN: [id number] ? ?Chief Complaint  ?Patient presents with  ? Nail Problem  ?  Thick painful toenails, 9 week follow up   ? ? ?87 y.o. female presents with the above complaint. History confirmed with patient.  Her nails are thickened and elongated and painful.  Debridement has been helpful.  She reports no other new issues ? ?Objective:  ?Physical Exam: ?warm, good capillary refill, no trophic changes or ulcerative lesions, normal DP and PT pulses and normal sensory exam.  She has thickened and elongated yellow toenails x10 with subungual debris ?Assessment:  ? ?1. Pain due to onychomycosis of toenails of both feet   ? ? ? ?Plan:  ?Patient was evaluated and treated and all questions answered. ? ? ?Discussed the etiology and treatment options for the condition in detail with the patient. Educated patient on the topical and oral treatment options for mycotic nails. Recommended debridement of the nails today. Sharp and mechanical debridement performed of all painful and mycotic nails today. Nails debrided in length and thickness using a nail nipper to level of comfort. Discussed treatment options including appropriate shoe gear. Follow up as needed for painful nails. ? ? ? ?Return in about 9 weeks (around 11/02/2021) for thickened painful nails.  ? ?

## 2021-09-01 ENCOUNTER — Other Ambulatory Visit: Payer: Self-pay | Admitting: Family Medicine

## 2021-09-06 ENCOUNTER — Ambulatory Visit: Payer: Medicare Other | Admitting: Physical Therapy

## 2021-09-16 ENCOUNTER — Other Ambulatory Visit: Payer: Self-pay | Admitting: Family Medicine

## 2021-09-30 ENCOUNTER — Other Ambulatory Visit: Payer: Self-pay | Admitting: Family Medicine

## 2021-10-11 ENCOUNTER — Telehealth: Payer: Self-pay

## 2021-10-11 NOTE — Telephone Encounter (Signed)
Sherry Lamb "Mikki Santee"  [facility] Clinical Pool (supporting Lesleigh Noe, MD) 1 hour ago (1:51 PM)   The family and I are concerned with  [place] and repeating herself in conversations . I gave her  Prevagen  regular strength daily   and she started this week. Do you think she should come in to see you? Yours, [employer] from pt's husband to pt's chart. Will respond to husband and let him know to schedule an appt for pt.

## 2021-10-12 NOTE — Telephone Encounter (Signed)
Yes she should make an appointment

## 2021-10-14 ENCOUNTER — Other Ambulatory Visit: Payer: Self-pay | Admitting: Family Medicine

## 2021-10-17 ENCOUNTER — Ambulatory Visit: Payer: Medicare Other | Admitting: Family Medicine

## 2021-10-17 ENCOUNTER — Encounter: Payer: Self-pay | Admitting: Family Medicine

## 2021-10-17 VITALS — BP 110/62 | HR 60 | Temp 97.0°F | Ht 63.5 in | Wt 140.0 lb

## 2021-10-17 DIAGNOSIS — H6121 Impacted cerumen, right ear: Secondary | ICD-10-CM

## 2021-10-17 DIAGNOSIS — G4701 Insomnia due to medical condition: Secondary | ICD-10-CM

## 2021-10-17 DIAGNOSIS — G3184 Mild cognitive impairment, so stated: Secondary | ICD-10-CM | POA: Diagnosis not present

## 2021-10-17 DIAGNOSIS — F320 Major depressive disorder, single episode, mild: Secondary | ICD-10-CM | POA: Diagnosis not present

## 2021-10-17 LAB — COMPREHENSIVE METABOLIC PANEL
ALT: 12 U/L (ref 0–35)
AST: 24 U/L (ref 0–37)
Albumin: 4.1 g/dL (ref 3.5–5.2)
Alkaline Phosphatase: 67 U/L (ref 39–117)
BUN: 23 mg/dL (ref 6–23)
CO2: 29 mEq/L (ref 19–32)
Calcium: 9.9 mg/dL (ref 8.4–10.5)
Chloride: 101 mEq/L (ref 96–112)
Creatinine, Ser: 0.59 mg/dL (ref 0.40–1.20)
GFR: 82.39 mL/min (ref >60.00)
Glucose, Bld: 94 mg/dL (ref 70–99)
Potassium: 4.3 mEq/L (ref 3.5–5.1)
Sodium: 140 mEq/L (ref 135–145)
Total Bilirubin: 0.7 mg/dL (ref 0.2–1.2)
Total Protein: 7 g/dL (ref 6.0–8.3)

## 2021-10-17 LAB — CBC WITH DIFFERENTIAL/PLATELET
Basophils Absolute: 0.1 10*3/uL (ref 0.0–0.1)
Basophils Relative: 1.1 % (ref 0.0–3.0)
Eosinophils Absolute: 0.1 10*3/uL (ref 0.0–0.7)
Eosinophils Relative: 1.2 % (ref 0.0–5.0)
HCT: 41.4 % (ref 36.0–46.0)
Hemoglobin: 13.7 g/dL (ref 12.0–15.0)
Lymphocytes Relative: 21.9 % (ref 12.0–46.0)
Lymphs Abs: 1.1 10*3/uL (ref 0.7–4.0)
MCHC: 33.2 g/dL (ref 30.0–36.0)
MCV: 90.8 fl (ref 78.0–100.0)
Monocytes Absolute: 0.7 10*3/uL (ref 0.1–1.0)
Monocytes Relative: 14.1 % — ABNORMAL HIGH (ref 3.0–12.0)
Neutro Abs: 3.2 10*3/uL (ref 1.4–7.7)
Neutrophils Relative %: 61.7 % (ref 43.0–77.0)
Platelets: 200 10*3/uL (ref 150.0–400.0)
RBC: 4.56 Mil/uL (ref 3.87–5.11)
RDW: 15.4 % (ref 11.5–15.5)
WBC: 5.1 10*3/uL (ref 4.0–10.5)

## 2021-10-17 LAB — TSH: TSH: 1.4 u[IU]/mL (ref 0.35–5.50)

## 2021-10-17 LAB — VITAMIN B12: Vitamin B-12: 353 pg/mL (ref 211–911)

## 2021-10-17 NOTE — Assessment & Plan Note (Signed)
Has not tried melatonin. Does not want other medications. Handout for sleep hygiene provided today. Advised considering melatonin to see if that helps. Discussed how sleep can impact cognition but that sleep issues are common among people with memory issues/dementia as well.

## 2021-10-17 NOTE — Progress Notes (Signed)
Subjective:     Sherry Lamb is a 87 y.o. female presenting for Follow-up (Memory concerns )     HPI  Here with husband  #memory concerns - will ask the same thing twice - daughter has noticed more - talking the phone, asking the same thing twice - husband noticing symptoms as well - daughter has a loud car and calling from the car - hearing - not sure when this was checked   Still driving - has not gotten lost Has not left the stove on  Did not sleep at all last night Does not have trouble falling asleep but difficulty staying asleep  Review of Systems   Social History   Tobacco Use  Smoking Status Never  Smokeless Tobacco Never        Objective:    BP Readings from Last 3 Encounters:  10/17/21 110/62  04/19/21 120/77  04/05/21 (!) 151/86   Wt Readings from Last 3 Encounters:  10/17/21 140 lb (63.5 kg)  04/18/21 138 lb 14.2 oz (63 kg)  04/05/21 139 lb 12.8 oz (63.4 kg)    BP 110/62   Pulse 60   Temp (!) 97 F (36.1 C) (Temporal)   Ht 5' 3.5" (1.613 m)   Wt 140 lb (63.5 kg)   SpO2 97%   BMI 24.41 kg/m    Physical Exam Constitutional:      General: She is not in acute distress.    Appearance: She is well-developed. She is not diaphoretic.  HENT:     Right Ear: External ear normal. There is impacted cerumen.     Left Ear: Tympanic membrane and external ear normal.     Nose: Nose normal.  Eyes:     Conjunctiva/sclera: Conjunctivae normal.  Cardiovascular:     Rate and Rhythm: Normal rate and regular rhythm.  Pulmonary:     Effort: Pulmonary effort is normal.  Musculoskeletal:     Cervical back: Neck supple.  Skin:    General: Skin is warm and dry.     Capillary Refill: Capillary refill takes less than 2 seconds.  Neurological:     Mental Status: She is alert. Mental status is at baseline.  Psychiatric:        Mood and Affect: Mood normal.        Behavior: Behavior normal.         10/17/2021   11:39 AM 04/21/2020   11:25 AM  10/20/2019   10:19 AM  Depression screen PHQ 2/9  Decreased Interest 2 0 1  Down, Depressed, Hopeless 2 0 1  PHQ - 2 Score 4 0 2  Altered sleeping 3  0  Tired, decreased energy 3  0  Change in appetite 0  0  Feeling bad or failure about yourself  2  0  Trouble concentrating 0  0  Moving slowly or fidgety/restless 0  0  Suicidal thoughts 0  0  PHQ-9 Score 12  2  Difficult doing work/chores Somewhat difficult  Not difficult at all        Assessment & Plan:   Problem List Items Addressed This Visit       Nervous and Auditory   Impacted cerumen of right ear    Poor hearing which improved after cerumen removal. See procedure below. Discussed impact of hearing on memory - will monitor for improvement.       Mild cognitive impairment - Primary    Multifactorial and may be 2/2 to poor sleep, depression, and  hearing loss 2/2 to cerumen impaction. Discussed addressing these issues but also with [place] 22/30 will get head imaging as well as blood work today.      Relevant Orders   Comprehensive metabolic panel   TSH   RPR   Vitamin B12   CBC with Differential   MR Brain Wo Contrast     Other   Depression, major, single episode, mild (HCC)    Pt notes depressed mood since prior to covid as well as withdrawn behavior. Discussed impact of memory loss - offered medication - she declined. Encouraged therapy - she was willing to take referral. Return 6 weeks to check.       Relevant Orders   Ambulatory referral to Psychology   Insomnia due to medical condition    Has not tried melatonin. Does not want other medications. Handout for sleep hygiene provided today. Advised considering melatonin to see if that helps. Discussed how sleep can impact cognition but that sleep issues are common among people with memory issues/dementia as well.       I spent 45 minutes with pt , obtaining history, examining, reviewing chart, documenting encounter and discussing the above plan of  care.  Procedure: Cerumen removal Location: Right ear  Informed consent obtained verbally. Debrox applied. Irrigation performed by MA with removal of cerumen. Patient tolerated the procedure well.   Return in about 6 weeks (around 11/28/2021) for mood and memory.  Lesleigh Noe, MD

## 2021-10-17 NOTE — Assessment & Plan Note (Signed)
Pt notes depressed mood since prior to covid as well as withdrawn behavior. Discussed impact of memory loss - offered medication - she declined. Encouraged therapy - she was willing to take referral. Return 6 weeks to check.

## 2021-10-17 NOTE — Assessment & Plan Note (Signed)
Multifactorial and may be 2/2 to poor sleep, depression, and hearing loss 2/2 to cerumen impaction. Discussed addressing these issues but also with [place] 22/30 will get head imaging as well as blood work today.

## 2021-10-17 NOTE — Patient Instructions (Addendum)
How to help anxiety and depression  1) Regular Exercise - walking, jogging, cycling, dancing, strength training - aiming for 150 minutes of exercise a week --> Yoga has been shown in research to reduce depression and anxiety -- with even just one hour long session per week  2)  Begin a Mindfulness/Meditation practice -- this can take a little as 3 minutes and is helpful for all kinds of mood issues -- You can find resources in books -- Or you can download apps like  ---- Headspace App  ---- Calm  ---- Insignt Timer ---- Stop, Breathe & Think  # With each of these Apps - you should decline the "start free trial" offer and as you search through the App should be able to access some of their free content. You can also chose to pay for the content if you find one that works well for you.   # Many of them also offer sleep specific content which may help with insomnia  3) Healthy Diet -- Avoid or decrease Caffeine -- Avoid or decrease Alcohol -- Drink plenty of water, have a balanced diet -- Avoid cigarettes and marijuana (as well as other recreational drugs)  4) Find a therapist  -- [facility] is one option. Call [phone] -- Or you can check out www.psychologytoday.com -- you can read bios of therapists and see if they accept insurance -- Check with your insurance to see if you have coverage and who may take your insurance    - Healthy - regular exercise - 20-30 of daily exercise  - social interaction     Sleep hygiene checklist: 1. Avoid naps during the day 2. Avoid stimulants such as caffeine and nicotine. Avoid bedtime alcohol (it can speed onset of sleep but the body's metabolism can cause awakenings). At least 2 hours before bedtime 3. All forms of exercise help ensure sound sleep - limit vigorous exercise to morning or late afternoon 4. Avoid food too close to bedtime including chocolate (which contains caffeine) 5. Soak up natural light 6. Establish  regular bedtime routine. 7. Associate bed with sleep - avoid TV, computer or phone, reading while in bed. 8. Ensure pleasant, relaxing sleep environment - quiet, dark, cool room.  Good Sleep Hygiene Habits -- Got to bed and wake up within an hour of the same time every day -- Avoid bright screens (from laptop, phone, TV) within at least 30 minutes before bed. The "blue light" supresses the sleep hormone melatonin and the content may stimulate as well -- Maintain a quiet and dark sleep environment (blackout curtains, turn on a fan or white noise to block out disruptive sounds) -- Practicing relaxing activites before bed (taking a shower, reading a book, journaling, meditation app) -- To quiet a busy mind -- consider journaling before bed (jotting down reminders, worry thoughts, as well as positive things like a gratitude list)   Begin a Mindfulness/Meditation practice -- this can take a little as 3 minutes -- You can find resources in books -- Or you can download apps like  ---- Headspace App (which currently has free content called "Weathering the Storm") ---- Calm (which has a few free options)  ---- Insignt Timer ---- Stop, Breathe & Think  # With each of these Apps - you should decline the "start free trial" offer and as you search through the App should be able to access some of their free content. You can also chose to pay for the content if you  find one that works well for you.   # Many of them also offer sleep specific content which may help with insomnia  Memory Compensation Strategies  Use "WARM" strategy.  W= write it down  A= associate it  R= repeat it  M= make a mental note  2.   You can keep a Social worker.  Use a 3-ring notebook with sections for the following: calendar, important names and phone numbers,  medications, doctors' names/phone numbers, lists/reminders, and a section to journal what you did  each day.   3.    Use a calendar to write appointments  down.  4.    Write yourself a schedule for the day.  This can be placed on the calendar or in a separate section of the Memory Notebook.  Keeping a  regular schedule can help memory.  5.    Use medication organizer with sections for each day or morning/evening pills.  You may need help loading it  6.    Keep a basket, or pegboard by the door.  Place items that you need to take out with you in the basket or on the pegboard.  You may also want to  include a message board for reminders.  7.    Use sticky notes.  Place sticky notes with reminders in a place where the task is performed.  For example: " turn off the  stove" placed by the stove, "lock the door" placed on the door at eye level, " take your medications" on  the bathroom mirror or by the place where you normally take your medications.  8.    Use alarms/timers.  Use while cooking to remind yourself to check on food or as a reminder to take your medicine, or as a  reminder to make a call, or as a reminder to perform another task, etc.

## 2021-10-17 NOTE — Assessment & Plan Note (Signed)
Poor hearing which improved after cerumen removal. See procedure below. Discussed impact of hearing on memory - will monitor for improvement.

## 2021-10-18 ENCOUNTER — Telehealth: Payer: Self-pay | Admitting: Family Medicine

## 2021-10-18 LAB — RPR: RPR Ser Ql: NONREACTIVE

## 2021-10-18 NOTE — Telephone Encounter (Signed)
Spoke to pt and answered all her questions.

## 2021-10-18 NOTE — Telephone Encounter (Signed)
Patient would like to discuss summary of her last appointment

## 2021-10-30 ENCOUNTER — Other Ambulatory Visit: Payer: Self-pay | Admitting: Family Medicine

## 2021-10-31 ENCOUNTER — Ambulatory Visit: Payer: Medicare Other | Admitting: Podiatry

## 2021-11-01 ENCOUNTER — Telehealth: Payer: Self-pay | Admitting: Family Medicine

## 2021-11-02 ENCOUNTER — Ambulatory Visit: Payer: Medicare Other | Admitting: Podiatry

## 2021-11-02 ENCOUNTER — Telehealth: Payer: Self-pay | Admitting: Family Medicine

## 2021-11-02 NOTE — Telephone Encounter (Signed)
Attempted to reach patient. If pt calls back please ask her if she means [facility]. We have never [facility] any [facility] to [facility] and pt has some memory issues.

## 2021-11-03 ENCOUNTER — Telehealth: Payer: Self-pay

## 2021-11-03 NOTE — Telephone Encounter (Signed)
Fine with me.   She does need f/u for Depression and memory loss

## 2021-11-03 NOTE — Telephone Encounter (Signed)
Patient is asking to do a transfer from [place] to [place]. Okay for transfer?

## 2021-11-03 NOTE — Telephone Encounter (Signed)
I asked patient which one and she said she thought she received a message from [facility] about and I made her aware of message below that we have never [facility] an [facility] and [facility] and she said she would

## 2021-11-04 ENCOUNTER — Other Ambulatory Visit: Payer: Self-pay

## 2021-11-04 MED ORDER — ROSUVASTATIN CALCIUM 5 MG PO TABS: 5.0000 mg | ORAL_TABLET | Freq: Every day | ORAL | 0 refills | Status: DC

## 2021-11-04 NOTE — Telephone Encounter (Signed)
Patient is calling back in, [facility] is cheaper option for patient and would prefer to go to them.

## 2021-11-04 NOTE — Telephone Encounter (Signed)
[facility]

## 2021-11-04 NOTE — Telephone Encounter (Signed)
I would prefer not to accept TOC. Other options possibly another provider in office.

## 2021-11-04 NOTE — Telephone Encounter (Signed)
Patient notified

## 2021-11-07 ENCOUNTER — Telehealth: Payer: Self-pay | Admitting: Family Medicine

## 2021-11-07 NOTE — Telephone Encounter (Signed)
Pt would like to reschedule her apt for 01/04/2022 with Health Nurse '@10'$ :30 am. Please advise, thanks.  Callback Number: [phone]

## 2021-11-16 ENCOUNTER — Ambulatory Visit: Payer: Medicare Other | Admitting: Podiatry

## 2021-11-16 ENCOUNTER — Encounter: Payer: Self-pay | Admitting: Podiatry

## 2021-11-16 DIAGNOSIS — M79674 Pain in right toe(s): Secondary | ICD-10-CM | POA: Diagnosis not present

## 2021-11-16 DIAGNOSIS — M79675 Pain in left toe(s): Secondary | ICD-10-CM | POA: Diagnosis not present

## 2021-11-16 DIAGNOSIS — B351 Tinea unguium: Secondary | ICD-10-CM | POA: Diagnosis not present

## 2021-11-16 NOTE — Progress Notes (Signed)
  Subjective:  Patient ID: Sherry Lamb, female    DOB: 1934/09/27,  MRN: [id number]  Chief Complaint  Patient presents with   Nail Problem    "Trim my toenails."    87 y.o. female presents with the above complaint. History confirmed with patient.  Her nails are thickened and elongated and painful.  Debridement has been helpful.  She reports no other new issues  Objective:  Physical Exam: warm, good capillary refill, no trophic changes or ulcerative lesions, normal DP and PT pulses and normal sensory exam.  She has thickened and elongated yellow toenails x10 with subungual debris Assessment:   1. Pain due to onychomycosis of toenails of both feet      Plan:  Patient was evaluated and treated and all questions answered.   Discussed the etiology and treatment options for the condition in detail with the patient. Educated patient on the topical and oral treatment options for mycotic nails. Recommended debridement of the nails today. Sharp and mechanical debridement performed of all painful and mycotic nails today. Nails debrided in length and thickness using a nail nipper to level of comfort. Discussed treatment options including appropriate shoe gear. Follow up as needed for painful nails.    Return in about 9 weeks (around 01/18/2022) for painful thickened nails .

## 2021-11-18 NOTE — Telephone Encounter (Signed)
error 

## 2021-11-21 ENCOUNTER — Ambulatory Visit
Admission: RE | Admit: 2021-11-21 | Discharge: 2021-11-21 | Disposition: A | Discharge status: Home / Self Care | Payer: Medicare Other | Source: Ambulatory Visit | Attending: Family Medicine | Admitting: Family Medicine

## 2021-11-21 DIAGNOSIS — G3184 Mild cognitive impairment, so stated: Secondary | ICD-10-CM | POA: Insufficient documentation

## 2021-11-28 ENCOUNTER — Ambulatory Visit: Payer: Medicare Other | Admitting: Family Medicine

## 2021-11-28 ENCOUNTER — Encounter: Payer: Self-pay | Admitting: Family Medicine

## 2021-11-28 ENCOUNTER — Telehealth: Payer: Self-pay | Admitting: Family Medicine

## 2021-11-28 VITALS — BP 140/70 | HR 66 | Temp 97.2°F | Wt 141.1 lb

## 2021-11-28 DIAGNOSIS — I679 Cerebrovascular disease, unspecified: Secondary | ICD-10-CM

## 2021-11-28 DIAGNOSIS — G319 Degenerative disease of nervous system, unspecified: Secondary | ICD-10-CM | POA: Diagnosis not present

## 2021-11-28 DIAGNOSIS — G3184 Mild cognitive impairment, so stated: Secondary | ICD-10-CM

## 2021-11-28 MED ORDER — DONEPEZIL HCL 5 MG PO TABS: 5.0000 mg | ORAL_TABLET | Freq: Every day | ORAL | 0 refills | Status: DC

## 2021-11-28 NOTE — Patient Instructions (Addendum)
Try the Aricept 5 mg - stomach side effects should get better within 1-3 weeks  Walk 5 days - try to exercise Puzzles - games  #Referral I have placed a referral to a specialist for you. You should receive a phone call from the specialty office. Make sure your voicemail is not full and that if you are able to answer your phone to unknown or new numbers.   It may take up to 2 weeks to hear about the referral. If you do not hear anything in 2 weeks, please call our office and ask to speak with the referral coordinator.     Memory Compensation Strategies  Use "WARM" strategy.  W= write it down  A= associate it  R= repeat it  M= make a mental note  2.   You can keep a Social worker.  Use a 3-ring notebook with sections for the following: calendar, important names and phone numbers,  medications, doctors' names/phone numbers, lists/reminders, and a section to journal what you did  each day.   3.    Use a calendar to write appointments down.  4.    Write yourself a schedule for the day.  This can be placed on the calendar or in a separate section of the Memory Notebook.  Keeping a  regular schedule can help memory.  5.    Use medication organizer with sections for each day or morning/evening pills.  You may need help loading it  6.    Keep a basket, or pegboard by the door.  Place items that you need to take out with you in the basket or on the pegboard.  You may also want to  include a message board for reminders.  7.    Use sticky notes.  Place sticky notes with reminders in a place where the task is performed.  For example: " turn off the  stove" placed by the stove, "lock the door" placed on the door at eye level, " take your medications" on  the bathroom mirror or by the place where you normally take your medications.  8.    Use alarms/timers.  Use while cooking to remind yourself to check on food or as a reminder to take your medicine, or as a  reminder to make a call, or as  a reminder to perform another task, etc.

## 2021-11-28 NOTE — Assessment & Plan Note (Signed)
This does seem to be in the absence of depression and not related to hearing loss.  Brain with atrophy as well as cerebrovascular disease.  Discussed consideration for trial of Aricept 5 mg.  GI side effects discussed.  She will return in approximately 2 months and update as needed.  Referral for neuro for ongoing support.

## 2021-11-28 NOTE — Assessment & Plan Note (Signed)
She is already on Crestor 5 mg and aspirin 81 mg. Lab Results  Component Value Date   LDLCALC 78 01/19/2021

## 2021-11-28 NOTE — Progress Notes (Signed)
Subjective:     Sherry Lamb is a 87 y.o. female presenting for Follow-up (6 week )     HPI  Here to review MRI results No known history of TIA or TIA-like symptoms.  He is taking aspirin and statin.  They have not noticed significant worsening of her symptoms Depression symptoms are potentially improved. Hearing has improved with removal of wax.  Review of Systems   Social History   Tobacco Use  Smoking Status Never  Smokeless Tobacco Never        Objective:    BP Readings from Last 3 Encounters:  11/28/21 140/70  10/17/21 110/62  04/19/21 120/77   Wt Readings from Last 3 Encounters:  11/28/21 141 lb 2 oz (64 kg)  10/17/21 140 lb (63.5 kg)  04/18/21 138 lb 14.2 oz (63 kg)    BP 140/70   Pulse 66   Temp (!) 97.2 F (36.2 C) (Temporal)   Wt 141 lb 2 oz (64 kg)   SpO2 93%   BMI 24.61 kg/m    Physical Exam Constitutional:      General: She is not in acute distress.    Appearance: She is well-developed. She is not diaphoretic.  HENT:     Right Ear: External ear normal.     Left Ear: External ear normal.     Nose: Nose normal.  Eyes:     Conjunctiva/sclera: Conjunctivae normal.  Cardiovascular:     Rate and Rhythm: Normal rate.  Pulmonary:     Effort: Pulmonary effort is normal.  Musculoskeletal:     Cervical back: Neck supple.  Skin:    General: Skin is warm and dry.     Capillary Refill: Capillary refill takes less than 2 seconds.  Neurological:     Mental Status: She is alert. Mental status is at baseline.  Psychiatric:        Mood and Affect: Mood normal.        Behavior: Behavior normal.         10/17/2021   11:39 AM 04/21/2020   11:25 AM 10/20/2019   10:19 AM  Depression screen PHQ 2/9  Decreased Interest 2 0 1  Down, Depressed, Hopeless 2 0 1  PHQ - 2 Score 4 0 2  Altered sleeping 3  0  Tired, decreased energy 3  0  Change in appetite 0  0  Feeling bad or failure about yourself  2  0  Trouble concentrating 0  0   Moving slowly or fidgety/restless 0  0  Suicidal thoughts 0  0  PHQ-9 Score 12  2  Difficult doing work/chores Somewhat difficult  Not difficult at all        Assessment & Plan:   Problem List Items Addressed This Visit       Cardiovascular and Mediastinum   Cerebral vascular disease    She is already on Crestor 5 mg and aspirin 81 mg. Lab Results  Component Value Date   LDLCALC 78 01/19/2021         Relevant Medications   donepezil (ARICEPT) 5 MG tablet   Other Relevant Orders   Ambulatory referral to Neurology   Ambulatory referral to Neuropsychology     Nervous and Auditory   Mild cognitive impairment - Primary    This does seem to be in the absence of depression and not related to hearing loss.  Brain with atrophy as well as cerebrovascular disease.  Discussed consideration for trial of Aricept 5 mg.  GI side effects discussed.  She will return in approximately 2 months and update as needed.  Referral for neuro for ongoing support.      Relevant Medications   donepezil (ARICEPT) 5 MG tablet   Other Relevant Orders   Ambulatory referral to Neurology   Ambulatory referral to Neuropsychology   Brain atrophy [facility])    Discussed that her memory loss is likely due to dementia and less likely related to depression.  She had difficulty answering the depression questionnaire, however overall it seems like her symptoms are not as bad as they were at her last visit.  Discussed referral to neurology as well as neuropsychology. for Further evaluation.  Daughter and husband both present to discuss plan.      Relevant Medications   donepezil (ARICEPT) 5 MG tablet   Other Relevant Orders   Ambulatory referral to Neurology   Ambulatory referral to Neuropsychology     Return in about 2 months (around 01/29/2022).  Lesleigh Noe, MD

## 2021-11-28 NOTE — Addendum Note (Signed)
Addended by: Lesleigh Noe on: 11/28/2021 03:22 PM   Modules accepted: Orders

## 2021-11-28 NOTE — Assessment & Plan Note (Signed)
Discussed that her memory loss is likely due to dementia and less likely related to depression.  She had difficulty answering the depression questionnaire, however overall it seems like her symptoms are not as bad as they were at her last visit.  Discussed referral to neurology as well as neuropsychology. for Further evaluation.  Daughter and husband both present to discuss plan.

## 2021-11-28 NOTE — Telephone Encounter (Signed)
Patient has a question about her new medication donepezil donepezil (ARICEPT) 5 MG tablet she wants to make sure it can be combined with her other two medications. Call back number [phone].

## 2021-11-29 NOTE — Telephone Encounter (Signed)
Spoke to pt and went over her concerns. Pt states understanding.

## 2021-11-29 NOTE — Telephone Encounter (Addendum)
[facility] Night - Client Nonclinical Telephone Record  [facility] Client [facility] Night - Client Client Site [place] Provider Waunita Schooner- MD Contact Type Call Who Is Calling Patient / Member / Family / Caregiver Caller Name [place] Phone Number [phone] Call Type Message Only Information Provided Reason for Call Returning a Call from the Office Initial [place] states they missed a call from the office. Disp. Time Disposition Final User 11/28/2021 8:11:27 PM General Information Provided Yes [place], [place] Call Closed By: Gracy Bruins Transaction Date/Time: 11/28/2021 8:09:17 PM (ET    [place] Night - Client Nonclinical Telephone Record  [facility] Client [place] Night - Client Client Site [place] Provider Waunita Schooner- MD Contact Type Call Who Is Calling Patient / Member / Family / Caregiver Caller Name [place] Phone Number [phone] Call Type Message Only Information Provided Reason for Call Returning a Call from the Office Initial [place] states she is returning missed calls from the office Additional Comment Provided office hours Disp. Time Disposition Final User 11/28/2021 8:31:24 PM General Information Provided Yes Vinnie Level Call Closed By: Vinnie Level Transaction Date/Time: 11/28/2021 8:29:20 PM (ET

## 2021-11-29 NOTE — Telephone Encounter (Signed)
[place] Night - Client Nonclinical Telephone Record  [facility] Client [facility] Night - Client Client Site [place] Provider Waunita Schooner- MD Contact Type Call Who Is Calling Patient / Member / Family / Caregiver Caller Name [place] Phone Number [phone] Call Type Message Only Information Provided Reason for Call Returning a Call from the Office Initial [place] states they missed a call from the office. Disp. Time Disposition Final User 11/28/2021 8:11:27 PM General Information Provided Yes Rios, [place] Call Closed By: Gracy Bruins Transaction Date/Time: 11/28/2021 8:09:17 PM (ET

## 2021-12-02 ENCOUNTER — Telehealth: Payer: Self-pay | Admitting: Family Medicine

## 2021-12-02 NOTE — Telephone Encounter (Signed)
Patient called and wanted to know if she should continue taking Apoaequorin (PREVAGEN) 10 MG CAPS since she got put on donepezil (ARICEPT) 5 MG tablet. Call back is [phone]

## 2021-12-05 NOTE — Telephone Encounter (Signed)
Mychart sent to pt and proxy (dtr).

## 2021-12-05 NOTE — Telephone Encounter (Signed)
She can stop prevagen

## 2021-12-06 ENCOUNTER — Telehealth: Payer: Self-pay | Admitting: Family Medicine

## 2021-12-06 NOTE — Telephone Encounter (Signed)
Patient called and stated that the referral she has at the facility they only take children. Call back number [phone].

## 2021-12-06 NOTE — Telephone Encounter (Signed)
Patient's daughter returned call re: referral not valid for her mother due to her age  Please re: referral [phone]  Note also in referral

## 2021-12-07 NOTE — Telephone Encounter (Signed)
That referral was placed last week in error.   Updated referral to Neurology.   Routing to Neurology referral coordinator to Verify that I was referring patient to NEUROLOGY - looks like it has been assigned to Dr. Jaynee Eagles. Please move forward with scheduling patient.    Routing to [employer] - Please update with appropriate referral to get to neuropsych evaluation. As Claris Pong notes this is not available with neurology office. And previous referral was for pediatric patients only  Routing to MA to reach out to family and let them know we are sorting out the referrals and if they are not called in 2 weeks to update.

## 2021-12-07 NOTE — Telephone Encounter (Signed)
Spoke to pt's dtr, Danise Mina (DPR), and explained that there were some errors made with the referrals and things are getting sorted. Jenn not happy with how things are progressing. She states that she called Dr. Ferdinand Lango office yesterday and they told her that they don't do the combination psych/neuro that was ordered so they wouldn't schedule the pt. I told Jenn I would pass that along and someone would be in touch soon.

## 2021-12-08 NOTE — Telephone Encounter (Signed)
Communicated with referral coordinator at neurology, she reached out to family yesterday and left a voicemail.  Spoke with husband today in the office and advised him to check voicemail and return call.  He will do so.

## 2021-12-20 ENCOUNTER — Other Ambulatory Visit: Payer: Self-pay | Admitting: Family Medicine

## 2021-12-20 DIAGNOSIS — G319 Degenerative disease of nervous system, unspecified: Secondary | ICD-10-CM

## 2021-12-20 DIAGNOSIS — G3184 Mild cognitive impairment, so stated: Secondary | ICD-10-CM

## 2021-12-20 DIAGNOSIS — I679 Cerebrovascular disease, unspecified: Secondary | ICD-10-CM

## 2021-12-20 NOTE — Telephone Encounter (Signed)
Neurology appointment is not till 01/09/22 Can this be refilled one more time?

## 2021-12-21 NOTE — Telephone Encounter (Signed)
Mychart message sent.

## 2021-12-21 NOTE — Telephone Encounter (Signed)
Please see if patient is tolerating, if so we can increase to 10 mg

## 2021-12-23 ENCOUNTER — Telehealth: Payer: Self-pay | Admitting: Family Medicine

## 2021-12-23 NOTE — Telephone Encounter (Signed)
  Encourage patient to contact the pharmacy for refills or they can request refills through [facility]  Did the patient contact the pharmacy:  n   LAST APPOINTMENT DATE:12/06/21  NEXT APPOINTMENT DATE:01/23/22  MEDICATION:donepezil (ARICEPT) 5 MG tablet  Is the patient out of medication? n  If not, how much is left?5 pills left  Is this a 90 day supply: y  PHARMACY: [id number] [place], [address]:  [phone] Fax:  [phone]     Let patient know to contact pharmacy at the end of the day to make sure medication is ready.  Please notify patient to allow 48-72 hours to process

## 2021-12-23 NOTE — Telephone Encounter (Signed)
Refill sent to Dr. Einar Pheasant.

## 2021-12-26 ENCOUNTER — Telehealth: Payer: Self-pay | Admitting: Family Medicine

## 2021-12-26 NOTE — Telephone Encounter (Signed)
Called patient let her know that it has been sent in. If she is not able to get today she will give [place] a call back.  No further action needed at this time.

## 2021-12-26 NOTE — Telephone Encounter (Signed)
Patient called in stating she will a call sometime today when you are available, in regards to her prescription donepezil (ARICEPT) 5 MG tablet. She stated that the pharmacy is waiting for approval and she only has 2 left. Informed Ellarie that the prescription was ordered today. Please advise. Thank you!

## 2022-01-04 ENCOUNTER — Ambulatory Visit: Payer: Medicare Other

## 2022-01-05 ENCOUNTER — Ambulatory Visit: Payer: Medicare Other

## 2022-01-06 ENCOUNTER — Ambulatory Visit: Payer: Medicare Other

## 2022-01-12 ENCOUNTER — Other Ambulatory Visit: Payer: Self-pay | Admitting: Family Medicine

## 2022-01-16 ENCOUNTER — Ambulatory Visit: Payer: Medicare Other | Admitting: Neurology

## 2022-01-19 ENCOUNTER — Encounter: Payer: Self-pay | Admitting: Neurology

## 2022-01-19 ENCOUNTER — Ambulatory Visit: Payer: Medicare Other | Admitting: Neurology

## 2022-01-19 VITALS — BP 147/70 | HR 69 | Ht 64.0 in | Wt 143.4 lb

## 2022-01-19 DIAGNOSIS — Z820 Family history of epilepsy and other diseases of the nervous system: Secondary | ICD-10-CM | POA: Insufficient documentation

## 2022-01-19 DIAGNOSIS — R413 Other amnesia: Secondary | ICD-10-CM | POA: Insufficient documentation

## 2022-01-19 NOTE — Progress Notes (Signed)
[facility]    Provider:  Dr Jaynee Eagles Requesting Provider: Lesleigh Noe, MD Primary Care Provider:  Lesleigh Noe, MD  CC:  daughter is concerned about patient asking same questions as well as family history of possibly dementia.  HPI:  Sherry Lamb is a 87 y.o. female here as requested by Lesleigh Noe, MD for dementia? In the spring daughter started noticing her mother was repeating questions and daughter was worried about. Mother had memory problems, grandmother had memory problems and dementia, undiagnosed. Daughter is here and provides most information. Grandmother had dementia starting about 51. Patient states her problems started 4 years ago with a move, she was "down" for a long time then covid came in on top of it, patient feels sad most days, daughter notices during the spring that patient had knee replacements and she had books to read by authors and typically patient is an avid reader and mother went by and patient did not feel like reading, patient would talk about loss of interest in hobbies, patient and husband live in an area and have struggled to connect since moving, now patient is going to silver sneakers. Patient reports her knees have been sore. Patient lives in a [place], husband pays the bills but has always done so, she drives and doesn't gets lost she drives locally, daughter asked her father and father has noticed she asks the same questionsin the same days, patient does not have hearing problems. She performs all her ADLs, IADLs, they both use smart phones well, they have a maid who cleans the house, husband and patient cook but use frozen food and go out to eat. No hallucinations or delusions. No changes in personality. No other focal neurologic deficits, associated symptoms, inciting events or modifiable factors.   Reviewed notes, labs and imaging from outside physicians, which showed:  10/2021: B12, rpr, tsh, cbc, cmp normal   MRI brain 11/2021:  IMPRESSION: reviewed imaging (also with daughter and patient) and agree 1. No evidence of acute intracranial abnormality. 2. Mild chronic small vessel ischemic changes within the cerebral white matter. 3. Mild generalized cerebral and cerebellar atrophy. 4. Paranasal sinus disease, as described.    Review of Systems: Patient complains of symptoms per HPI as well as the following symptoms repeating questions. Pertinent negatives and positives per HPI. All others negative.   Social History   Socioeconomic History   Marital status: Married    Spouse name: [name]   Number of children: 1   Years of education: secretarial school   Highest education level: Not on file  Occupational History   Not on file  Tobacco Use   Smoking status: Never   Smokeless tobacco: Never  Vaping Use   Vaping Use: Never used  Substance and Sexual Activity   Alcohol use: Yes    Alcohol/week: 1.0 standard drink of alcohol    Types: 1 Glasses of wine per week    Comment: 1 wine day    Drug use: Never   Sexual activity: Yes    Partners: Male    Birth control/protection: Post-menopausal, Surgical    Comment: Hysterectomy  Other Topics Concern   Not on file  Social History Narrative   07/07/20   From: [place], [place] to be near family   Living: with husband [name] (1966)   Work: retired from music business - [employer]      Family: daughter Anderson [place] - no children      Enjoys: play  tennis (though cannot play), reading, music      Exercise: walking, tries to do her knee therapy   Diet: generally      Safety   Seat belts: Yes    Guns: No   Safe in relationships: Yes    Social Determinants of Health   Financial Resource Strain: Low Risk  (10/20/2019)   Overall Financial Resource Strain (CARDIA)    Difficulty of Paying Living Expenses: Not hard at all  Food Insecurity: No Food Insecurity (10/20/2019)   Hunger Vital Sign    Worried About Running Out of Food in the Last Year:  Never true    Ran Out of Food in the Last Year: Never true  Transportation Needs: No Transportation Needs (10/20/2019)   PRAPARE - Hydrologist (Medical): No    Lack of Transportation (Non-Medical): No  Physical Activity: Inactive (10/20/2019)   Exercise Vital Sign    Days of Exercise per Week: 0 days    Minutes of Exercise per Session: 0 min  Stress: No Stress Concern Present (10/20/2019)   [place]    Feeling of Stress : Not at all  Social Connections: Not on file  Intimate Partner Violence: Not At Risk (10/20/2019)   Humiliation, Afraid, Rape, and Kick questionnaire    Fear of Current or Ex-Partner: No    Emotionally Abused: No    Physically Abused: No    Sexually Abused: No    Family History  Problem Relation Age of Onset   Arthritis Mother    Asthma Mother    Hearing loss Mother    Osteoporosis Mother    Heart disease Father    Hyperlipidemia Father    Kidney disease Father    Depression Brother    Hyperlipidemia Brother    Arrhythmia Brother        Psychologist, forensic   Breast cancer Neg Hx     Past Medical History:  Diagnosis Date   Arthritis    Frequent headaches    GERD (gastroesophageal reflux disease)    Hyperlipidemia    Pneumonia    PONV (postoperative nausea and vomiting)     Patient Active Problem List   Diagnosis Date Noted   FHx: memory loss 01/19/2022   Short-term memory loss 01/19/2022   Brain atrophy ([place]) 11/28/2021   Cerebral vascular disease 11/28/2021   Mild cognitive impairment 10/17/2021   Depression, major, single episode, mild ([place]) 10/17/2021   Insomnia due to medical condition 10/17/2021   Primary osteoarthritis of left knee 04/18/2021   Weight loss 10/25/2020   Osteoporosis 07/07/2020   Primary osteoarthritis of right knee 02/23/2020   Lumbar radiculopathy 09/27/2018   Osteoarthritis of knee 08/26/2018   Degeneration of lumbar intervertebral  disc 08/20/2018   Bilateral chronic knee pain 06/06/2018   Hyperlipidemia    GERD (gastroesophageal reflux disease)    Frequent headaches    Arthritis     Past Surgical History:  Procedure Laterality Date   ABDOMINAL HYSTERECTOMY     ANKLE SURGERY     EYE SURGERY     Left hip replacement Left 2013   right hip replacement Right 2000   TONSILLECTOMY AND ADENOIDECTOMY  1943   TOTAL KNEE ARTHROPLASTY Right 02/23/2020   Procedure: TOTAL KNEE ARTHROPLASTY;  Surgeon: Gaynelle Arabian, MD;  Location: [facility];  Service: Orthopedics;  Laterality: Right;  22mn   TOTAL KNEE ARTHROPLASTY Left 04/18/2021   Procedure: TOTAL KNEE ARTHROPLASTY;  Surgeon: Gaynelle Arabian, MD;  Location: [facility];  Service: Orthopedics;  Laterality: Left;   WISDOM TOOTH EXTRACTION      Current Outpatient Medications  Medication Sig Dispense Refill   aspirin EC 81 MG tablet Take 1 tablet by mouth daily at 12 noon.     Cholecalciferol (VITAMIN D) 50 MCG (2000 UT) CAPS Take 2,000 Units by mouth daily.     donepezil (ARICEPT) 5 MG tablet TAKE 1 TABLET BY MOUTH EVERYDAY AT BEDTIME 90 tablet 0   Polyvinyl Alcohol-Povidone (REFRESH OP) Place 1 drop into both eyes 2 (two) times daily.     rosuvastatin (CRESTOR) 5 MG tablet TAKE 1 TABLET BY MOUTH DAILY 90 tablet 0   Turmeric 500 MG CAPS Take 500 mg by mouth daily.     No current facility-administered medications for this visit.    Allergies as of 01/19/2022 - Review Complete 01/19/2022  Allergen Reaction Noted   Penicillins Other (See Comments) 09/10/2017   Prednisone Other (See Comments) 02/16/2020    Vitals: BP (!) 147/70   Pulse 69   Ht '5\' 4"'$  (1.626 m)   Wt 143 lb 6.4 oz (65 kg)   BMI 24.61 kg/m  Last Weight:  Wt Readings from Last 1 Encounters:  01/19/22 143 lb 6.4 oz (65 kg)   Last Height:   Ht Readings from Last 1 Encounters:  01/19/22 '5\' 4"'$  (1.626 m)     Physical exam: Exam: Gen: NAD, conversant, well nourised,  well groomed                     CV:  RRR, no MRG. No Carotid Bruits. No peripheral edema, warm, nontender Eyes: Conjunctivae clear without exudates or hemorrhage  Neuro: Detailed Neurologic Exam  Speech:    Speech is normal; fluent and spontaneous with normal comprehension.  Cognition:     01/19/2022   11:44 AM 10/20/2019   10:24 AM 09/18/2018    5:29 PM  MMSE - Mini Mental State Exam  Orientation to time '5 5 5  '$ Orientation to Place '4 5 5  '$ Registration '3 3 3  '$ Attention/ Calculation 3 5 0  Recall '2 3 3  '$ Language- name 2 objects 2  0  Language- repeat '1 1 1  '$ Language- follow 3 step command 3  0  Language- read & follow direction 1  0  Write a sentence 1  0  Copy design 0  0  Total score 25  17       The patient is oriented to person, place, and time;     recent and remote memory intact;     language fluent;     normal attention, concentration,     fund of knowledge Cranial Nerves:    The pupils are equal, round, and reactive to light. Attempted, pupils too small to visualize fundi. Visual fields are full to finger confrontation. Extraocular movements are intact. Trigeminal sensation is intact and the muscles of mastication are normal. The face is symmetric. The palate elevates in the midline. Hearing intact. Voice is normal. Shoulder shrug is normal. The tongue has normal motion without fasciculations.   Coordination:    Normal   Gait:    normal.   Motor Observation:    No asymmetry, no atrophy, and no involuntary movements noted. Tone:    Normal muscle tone.    Posture:    Posture is normal. normal erect    Strength:    Strength is V/V in the upper and  lower limbs.      Sensation: intact to LT     Reflex Exam:  DTR's:    Deep tendon reflexes in the upper and lower extremities are normal bilaterally.   Toes:    The toes are downgoing bilaterally.   Clonus:    Clonus is absent.    Assessment/Plan:  Patient with a Fhx of possibly dementia, daughter is concerned because patient repeats  questions. She does appear to be repetitive today and at one point seemed to forget we reviewed her brain.MMSE 25/30. She does appear to have anxiety and depression and the move here to [place] was difficult for her right before the pandemic, this may be impacting her as well.  MRI of the brain is essentially normal for age, some atrophy, mild white matter changes Blood work was negative  Formal memory testing Dr. Alphonzo Severance Continue Donepezil, may increase based on memory testing May also consider FDG PET Scan if concern for MCI or Prodromal Alzheimer's disease  Recommended to patient and daughter: "The XX Brain" Eden Emms  "MIND" diet [place] Healthy Brain Study  Discussed Dementia vs MCI vs nornal cognitive aging    Orders Placed This Encounter  Procedures   Ambulatory referral to Neuropsychology   No orders of the defined types were placed in this encounter.   Cc: Lesleigh Noe, MD,  Lesleigh Noe, MD  Sarina Ill, MD  [facility] [address] [place],  [address]  Phone [phone] Fax [phone]  I spent over 60 minutes of face-to-face and non-face-to-face time with patient on the  1. Short-term memory loss   2. FHx: memory loss    diagnosis.  This included previsit chart review, lab review, study review, order entry, electronic health record documentation, patient education on the different diagnostic and therapeutic options, counseling and coordination of care, risks and benefits of management, compliance, or risk factor reduction

## 2022-01-19 NOTE — Patient Instructions (Addendum)
Formal neurocognitive testing - Dr. Alphonzo Severance  "The XX Brain" Eden Emms  "MIND" diet [place] Healthy Brain Study  Dementia Dementia is a condition that affects the way the brain functions. It often affects memory and thinking. Usually, dementia gets worse with time and cannot be reversed (progressive dementia). There are many types of dementia, including: Alzheimer's disease. This type is the most common. Vascular dementia. This type may happen as the result of a stroke. Lewy body dementia. This type may happen to people who have Parkinson's disease. Frontotemporal dementia. This type is caused by damage to nerve cells (neurons) in certain parts of the brain. Some people may be affected by more than one type of dementia. This is called mixed dementia. What are the causes? Dementia is caused by damage to cells in the brain. The area of the brain and the types of cells damaged determine the type of dementia. Usually, this damage is irreversible or cannot be undone. Some examples of irreversible causes include: Conditions that affect the blood vessels of the brain, such as diabetes, heart disease, or blood vessel disease. Genetic mutations. In some cases, changes in the brain may be caused by another condition and can be reversed or slowed. Some examples of reversible causes include: Injury to the brain. Certain medicines. Infection, such as meningitis. Metabolic problems, such as vitamin B12 deficiency or thyroid disease. Pressure on the brain, such as from a tumor, blood clot, or too much fluid in the brain (hydrocephalus). Autoimmune diseases that affect the brain or arteries, such as limbic encephalitis or vasculitis. What are the signs or symptoms? Symptoms of dementia depend on the type of dementia. Common signs of dementia include problems with remembering, thinking, problem solving, decision making, and communicating. These signs develop slowly or get worse with  time. This may include: Problems remembering events or people. Having trouble taking a bath or putting clothes on. Forgetting appointments or forgetting to pay bills. Difficulty planning and preparing meals. Having trouble speaking. Getting lost easily. Changes in behavior or mood. How is this diagnosed? This condition is diagnosed by a specialist (neurologist). It is diagnosed based on the history of your symptoms, your medical history, a physical exam, and tests. Tests may include: Tests to evaluate brain function, such as memory tests, cognitive tests, and other tests. Lab tests, such as blood or urine tests. Imaging tests, such as a CT scan, a PET scan, or an MRI. Genetic testing. This may be done if other family members have a diagnosis of certain types of dementia. Your health care provider will talk with you and your family, friends, or caregivers about your history and symptoms. How is this treated? Treatment for this condition depends on the cause of the dementia. Progressive dementias, such as Alzheimer's disease, cannot be cured, but there may be treatments that help to manage symptoms. Treatment might involve taking medicines that may help to: Control the dementia. Slow down the progression of the dementia. Manage symptoms. In some cases, treating the cause of your dementia can improve symptoms, reverse symptoms, or slow down how quickly your dementia becomes worse. Your health care provider can direct you to support groups, organizations, and other health care providers who can help with decisions about your care. Follow these instructions at home: Medicines Take over-the-counter and prescription medicines only as told by your health care provider. Use a pill organizer or pill reminder to help you manage your medicines. Avoid taking medicines that can affect thinking, such as  pain medicines or sleeping medicines. Lifestyle Make healthy lifestyle choices. Be physically  active as told by your health care provider. Do not use any products that contain nicotine or tobacco, such as cigarettes, e-cigarettes, and chewing tobacco. If you need help quitting, ask your health care provider. Do not drink alcohol. Practice stress-management techniques when you get stressed. Spend time with other people. Make sure to get quality sleep. These tips can help you get a good night's rest: Avoid napping during the day. Keep your sleeping area dark and cool. Avoid exercising during the few hours before you go to bed. Avoid caffeine products in the evening. Eating and drinking Drink enough fluid to keep your urine pale yellow. Eat a healthy diet. General instructions  Work with your health care provider to determine what you need help with and what your safety needs are. Talk with your health care provider about whether it is safe for you to drive. If you were given a bracelet that identifies you as a person with memory loss or tracks your location, make sure to wear it at all times. Work with your family to make important decisions, such as advance directives, medical power of attorney, or a living will. Keep all follow-up visits. This is important. Where to find more information Alzheimer's Association: [employer]Mile.co.nz National Institute on Aging: DVDEnthusiasts.nl World Health Organization: RoleLink.com.br Contact a health care provider if: You have any new or worsening symptoms. You have problems with choking or swallowing. Get help right away if: You feel depressed or sad, or feel that you want to harm yourself. Your family members become concerned for your safety. If you ever feel like you may hurt yourself or others, or have thoughts about taking your own life, get help right away. Go to your nearest emergency department or: Call your local emergency services (911 in the [place] Call a suicide crisis helpline, such as the [place] at  3671540131 or 988 in the [place]. This is open 24 hours a day in the [place] Text the Crisis Text Line at 269 432 7020 (in the [place].). Summary Dementia is a condition that affects the way the brain functions. Dementia often affects memory and thinking. Usually, dementia gets worse with time and cannot be reversed (progressive dementia). Treatment for this condition depends on the cause of the dementia. Work with your health care provider to determine what you need help with and what your safety needs are. Your health care provider can direct you to support groups, organizations, and other health care providers who can help with decisions about your care. This information is not intended to replace advice given to you by your health care provider. Make sure you discuss any questions you have with your health care provider. Document Revised: 11/17/2020 Document Reviewed: 09/08/2019 Elsevier Patient Education  [place].

## 2022-01-20 ENCOUNTER — Telehealth: Payer: Self-pay | Admitting: Neurology

## 2022-01-20 ENCOUNTER — Ambulatory Visit (INDEPENDENT_AMBULATORY_CARE_PROVIDER_SITE_OTHER): Payer: Medicare Other

## 2022-01-20 VITALS — Ht 64.0 in | Wt 143.0 lb

## 2022-01-20 DIAGNOSIS — Z0000 Encounter for general adult medical examination without abnormal findings: Secondary | ICD-10-CM

## 2022-01-20 NOTE — Telephone Encounter (Signed)
Referral for neuropsychology sent to Dr. Otto Herb office.

## 2022-01-20 NOTE — Patient Instructions (Signed)
Sherry Lamb , Thank you for taking time to come for your Medicare Wellness Visit. I appreciate your ongoing commitment to your health goals. Please review the following plan we discussed and let me know if I can assist you in the future.   Screening recommendations/referrals: Colonoscopy: aged out Mammogram: aged out Bone Density: aged out Recommended yearly ophthalmology/optometry visit for glaucoma screening and checkup Recommended yearly dental visit for hygiene and checkup  Vaccinations: Influenza vaccine: 01/24/21 Pneumococcal vaccine: 10/27/19 Tdap vaccine: n/d Shingles vaccine: Shingrix 11/19/18, 07/02/18   Covid-19:05/26/19, 06/16/19, 02/10/20  Advanced directives: yes  Conditions/risks identified: none  Next appointment: Follow up in one year for your annual wellness visit 01/22/23 @ 3:15 pm by phone   Preventive Care 59 Years and Older, Female Preventive care refers to lifestyle choices and visits with your health care provider that can promote health and wellness. What does preventive care include? A yearly physical exam. This is also called an annual well check. Dental exams once or twice a year. Routine eye exams. Ask your health care provider how often you should have your eyes checked. Personal lifestyle choices, including: Daily care of your teeth and gums. Regular physical activity. Eating a healthy diet. Avoiding tobacco and drug use. Limiting alcohol use. Practicing safe sex. Taking low-dose aspirin every day. Taking vitamin and mineral supplements as recommended by your health care provider. What happens during an annual well check? The services and screenings done by your health care provider during your annual well check will depend on your age, overall health, lifestyle risk factors, and family history of disease. Counseling  Your health care provider may ask you questions about your: Alcohol use. Tobacco use. Drug use. Emotional well-being. Home and  relationship well-being. Sexual activity. Eating habits. History of falls. Memory and ability to understand (cognition). Work and work Statistician. Reproductive health. Screening  You may have the following tests or measurements: Height, weight, and BMI. Blood pressure. Lipid and cholesterol levels. These may be checked every 5 years, or more frequently if you are over 64 years old. Skin check. Lung cancer screening. You may have this screening every year starting at age 42 if you have a 30-pack-year history of smoking and currently smoke or have quit within the past 15 years. Fecal occult blood test (FOBT) of the stool. You may have this test every year starting at age 85. Flexible sigmoidoscopy or colonoscopy. You may have a sigmoidoscopy every 5 years or a colonoscopy every 10 years starting at age 85. Hepatitis C blood test. Hepatitis B blood test. Sexually transmitted disease (STD) testing. Diabetes screening. This is done by checking your blood sugar (glucose) after you have not eaten for a while (fasting). You may have this done every 1-3 years. Bone density scan. This is done to screen for osteoporosis. You may have this done starting at age 85. Mammogram. This may be done every 1-2 years. Talk to your health care provider about how often you should have regular mammograms. Talk with your health care provider about your test results, treatment options, and if necessary, the need for more tests. Vaccines  Your health care provider may recommend certain vaccines, such as: Influenza vaccine. This is recommended every year. Tetanus, diphtheria, and acellular pertussis (Tdap, Td) vaccine. You may need a Td booster every 10 years. Zoster vaccine. You may need this after age 85. Pneumococcal 13-valent conjugate (PCV13) vaccine. One dose is recommended after age 85. Pneumococcal polysaccharide (PPSV23) vaccine. One dose is recommended after age 85.  Talk to your health care provider  about which screenings and vaccines you need and how often you need them. This information is not intended to replace advice given to you by your health care provider. Make sure you discuss any questions you have with your health care provider. Document Released: 05/21/2015 Document Revised: 01/12/2016 Document Reviewed: 02/23/2015 Elsevier Interactive Patient Education  2017 [place] Prevention in the Home Falls can cause injuries. They can happen to people of all ages. There are many things you can do to make your home safe and to help prevent falls. What can I do on the outside of my home? Regularly fix the edges of walkways and driveways and fix any cracks. Remove anything that might make you trip as you walk through a door, such as a raised step or threshold. Trim any bushes or trees on the path to your home. Use bright outdoor lighting. Clear any walking paths of anything that might make someone trip, such as rocks or tools. Regularly check to see if handrails are loose or broken. Make sure that both sides of any steps have handrails. Any raised decks and porches should have guardrails on the edges. Have any leaves, snow, or ice cleared regularly. Use sand or salt on walking paths during winter. Clean up any spills in your garage right away. This includes oil or grease spills. What can I do in the bathroom? Use night lights. Install grab bars by the toilet and in the tub and shower. Do not use towel bars as grab bars. Use non-skid mats or decals in the tub or shower. If you need to sit down in the shower, use a plastic, non-slip stool. Keep the floor dry. Clean up any water that spills on the floor as soon as it happens. Remove soap buildup in the tub or shower regularly. Attach bath mats securely with double-sided non-slip rug tape. Do not have throw rugs and other things on the floor that can make you trip. What can I do in the bedroom? Use night lights. Make sure  that you have a light by your bed that is easy to reach. Do not use any sheets or blankets that are too big for your bed. They should not hang down onto the floor. Have a firm chair that has side arms. You can use this for support while you get dressed. Do not have throw rugs and other things on the floor that can make you trip. What can I do in the kitchen? Clean up any spills right away. Avoid walking on wet floors. Keep items that you use a lot in easy-to-reach places. If you need to reach something above you, use a strong step stool that has a grab bar. Keep electrical cords out of the way. Do not use floor polish or wax that makes floors slippery. If you must use wax, use non-skid floor wax. Do not have throw rugs and other things on the floor that can make you trip. What can I do with my stairs? Do not leave any items on the stairs. Make sure that there are handrails on both sides of the stairs and use them. Fix handrails that are broken or loose. Make sure that handrails are as long as the stairways. Check any carpeting to make sure that it is firmly attached to the stairs. Fix any carpet that is loose or worn. Avoid having throw rugs at the top or bottom of the stairs. If you do have throw  rugs, attach them to the floor with carpet tape. Make sure that you have a light switch at the top of the stairs and the bottom of the stairs. If you do not have them, ask someone to add them for you. What else can I do to help prevent falls? Wear shoes that: Do not have high heels. Have rubber bottoms. Are comfortable and fit you well. Are closed at the toe. Do not wear sandals. If you use a stepladder: Make sure that it is fully opened. Do not climb a closed stepladder. Make sure that both sides of the stepladder are locked into place. Ask someone to hold it for you, if possible. Clearly mark and make sure that you can see: Any grab bars or handrails. First and last steps. Where the edge of  each step is. Use tools that help you move around (mobility aids) if they are needed. These include: Canes. Walkers. Scooters. Crutches. Turn on the lights when you go into a dark area. Replace any light bulbs as soon as they burn out. Set up your furniture so you have a clear path. Avoid moving your furniture around. If any of your floors are uneven, fix them. If there are any pets around you, be aware of where they are. Review your medicines with your doctor. Some medicines can make you feel dizzy. This can increase your chance of falling. Ask your doctor what other things that you can do to help prevent falls. This information is not intended to replace advice given to you by your health care provider. Make sure you discuss any questions you have with your health care provider. Document Released: 02/18/2009 Document Revised: 09/30/2015 Document Reviewed: 05/29/2014 Elsevier Interactive Patient Education  2017 [employer].

## 2022-01-20 NOTE — Progress Notes (Signed)
Virtual Visit via Telephone Note  I connected with  Sherry Lamb on 01/20/22 at 10:30 AM EDT by telephone and verified that I am speaking with the correct person using two identifiers.  Location: Patient: home Provider: [place] Persons participating in the virtual visit: [place]   I discussed the limitations, risks, security and privacy concerns of performing an evaluation and management service by telephone and the availability of in person appointments. The patient expressed understanding and agreed to proceed.  Interactive audio and video telecommunications were attempted between this nurse and patient, however failed, due to patient having technical difficulties OR patient did not have access to video capability.  We continued and completed visit with audio only.  Some vital signs may be absent or patient reported.   Dionisio David, LPN  Subjective:   Sherry Lamb is a 87 y.o. female who presents for Medicare Annual (Subsequent) preventive examination.  Review of Systems     Cardiac Risk Factors include: advanced age (>40mn, >>73women);dyslipidemia     Objective:    There were no vitals filed for this visit. There is no height or weight on file to calculate BMI.     01/20/2022   10:33 AM 04/18/2021    3:49 PM 04/05/2021   11:00 AM 01/19/2021   11:44 AM 02/23/2020    6:45 PM 02/13/2020   10:22 AM 10/20/2019   10:16 AM  Advanced Directives  Does Patient Have a Medical Advance Directive? Yes Yes Yes Yes Yes Yes Yes  Type of AParamedicof ASpringdaleLiving will HR[place]Living will HH[place]Living will Healthcare Power of AHenry ForkLiving will HPoincianaLiving will HSouth ApopkaLiving will  Does patient want to make changes to medical advance directive? No - Patient declined No - Patient declined  No - Patient declined No  - Guardian declined    Copy of HDurandin Chart? Yes - validated most recent copy scanned in chart (See row information)  Yes - validated most recent copy scanned in chart (See row information) Yes - validated most recent copy scanned in chart (See row information) Yes - validated most recent copy scanned in chart (See row information) No - copy requested No - copy requested    Current Medications (verified) Outpatient Encounter Medications as of 01/20/2022  Medication Sig   aspirin EC 81 MG tablet Take 1 tablet by mouth daily at 12 noon.   Cholecalciferol (VITAMIN D) 50 MCG (2000 UT) CAPS Take 2,000 Units by mouth daily.   donepezil (ARICEPT) 5 MG tablet TAKE 1 TABLET BY MOUTH EVERYDAY AT BEDTIME   Polyvinyl Alcohol-Povidone (REFRESH OP) Place 1 drop into both eyes 2 (two) times daily.   rosuvastatin (CRESTOR) 5 MG tablet TAKE 1 TABLET BY MOUTH DAILY   Turmeric 500 MG CAPS Take 500 mg by mouth daily.   No facility-administered encounter medications on file as of 01/20/2022.    Allergies (verified) Penicillins and Prednisone   History: Past Medical History:  Diagnosis Date   Arthritis    Frequent headaches    GERD (gastroesophageal reflux disease)    Hyperlipidemia    Pneumonia    PONV (postoperative nausea and vomiting)    Past Surgical History:  Procedure Laterality Date   ABDOMINAL HYSTERECTOMY     ANKLE SURGERY     EYE SURGERY     Left hip replacement Left 2013   right hip replacement  Right 2000   TONSILLECTOMY AND ADENOIDECTOMY  1943   TOTAL KNEE ARTHROPLASTY Right 02/23/2020   Procedure: TOTAL KNEE ARTHROPLASTY;  Surgeon: Gaynelle Arabian, MD;  Location: [facility];  Service: Orthopedics;  Laterality: Right;  35mn   TOTAL KNEE ARTHROPLASTY Left 04/18/2021   Procedure: TOTAL KNEE ARTHROPLASTY;  Surgeon: AGaynelle Arabian MD;  Location: [facility];  Service: Orthopedics;  Laterality: Left;   WISDOM TOOTH EXTRACTION     Family History  Problem Relation Age of  Onset   Arthritis Mother    Asthma Mother    Hearing loss Mother    Osteoporosis Mother    Heart disease Father    Hyperlipidemia Father    Kidney disease Father    Depression Brother    Hyperlipidemia Brother    Arrhythmia Brother        pPsychologist, forensic  Breast cancer Neg Hx    Social History   Socioeconomic History   Marital status: Married    Spouse name: RHerbie [place]  Number of children: 1   Years of education: secretarial school   Highest education level: Not on file  Occupational History   Not on file  Tobacco Use   Smoking status: Never   Smokeless tobacco: Never  Vaping Use   Vaping Use: Never used  Substance and Sexual Activity   Alcohol use: Yes    Alcohol/week: 1.0 standard drink of alcohol    Types: 1 Glasses of wine per week    Comment: 1 wine day    Drug use: Never   Sexual activity: Yes    Partners: Male    Birth control/protection: Post-menopausal, Surgical    Comment: Hysterectomy  Other Topics Concern   Not on file  Social History Narrative   07/07/20   From: [place] near family   Living: with husband RHerbie [place](1966)   Work: retired from music business - [employer]      Family: daughter -Anderson [place]- no children      Enjoys: play tennis (though cannot play), reading, music      Exercise: walking, tries to do her knee therapy   Diet: generally      Safety   Seat belts: Yes    Guns: No   Safe in relationships: Yes    Social Determinants of Health   Financial Resource Strain: Low Risk  (01/20/2022)   Overall Financial Resource Strain (CARDIA)    Difficulty of Paying Living Expenses: Not hard at all  Food Insecurity: No Food Insecurity (01/20/2022)   Hunger Vital Sign    Worried About Running Out of Food in the Last Year: Never true    R[place]in the Last Year: Never true  Transportation Needs: No Transportation Needs (01/20/2022)   PRAPARE - THydrologist(Medical): No    Lack of  Transportation (Non-Medical): No  Physical Activity: Sufficiently Active (01/20/2022)   Exercise Vital Sign    Days of Exercise per Week: 4 days    Minutes of Exercise per Session: 40 min  Stress: No Stress Concern Present (01/20/2022)   [place]   Feeling of Stress : Only a little  Social Connections: Moderately Integrated (01/20/2022)   Social Connection and Isolation Panel [NHANES]    Frequency of Communication with Friends and Family: More than three times a week    Frequency of Social Gatherings with Friends and Family:  Never    Attends Religious Services: More than 4 times per year    Active Member of Clubs or Organizations: No    Attends Archivist Meetings: Never    Marital Status: Married    Tobacco Counseling Counseling given: Not Answered   Clinical Intake:  Pre-visit preparation completed: Yes  Pain : No/denies pain     Nutritional Risks: None Diabetes: No  How often do you need to have someone help you when you read instructions, pamphlets, or other written materials from your doctor or pharmacy?: 1 - Never  Diabetic?no  Interpreter Needed?: No  Information entered by :: Kirke Shaggy, LPN   Activities of Daily Living    01/20/2022   10:35 AM 04/18/2021    3:49 PM  In your present state of health, do you have any difficulty performing the following activities:  Hearing? 0 0  Vision? 0 0  Difficulty concentrating or making decisions? 0 0  Walking or climbing stairs? 0 1  Dressing or bathing? 0 0  Doing errands, shopping? 0 0  Preparing Food and eating ? N   Using the Toilet? N   In the past six months, have you accidently leaked urine? N   Do you have problems with loss of bowel control? N   Managing your Medications? N   Managing your Finances? N   Housekeeping or managing your Housekeeping? N     Patient Care Team: Lesleigh Noe, MD as PCP - General (Family  Medicine) Derl Barrow, [place] as Physician Assistant (Orthopedic Surgery) Carmon Ginsberg (Orthopedic Surgery)  Indicate any recent Medical Services you may have received from other than [facility] providers in the past year (date may be approximate).     Assessment:   This is a routine wellness examination for [facility].  Hearing/Vision screen Hearing Screening - Comments:: No aids Vision Screening - Comments:: Wears contacts- [employer]  Dietary issues and exercise activities discussed: Current Exercise Habits: Home exercise routine, Type of exercise: walking, Time (Minutes): 40, Frequency (Times/Week): 4, Weekly Exercise (Minutes/Week): 160, Intensity: Mild   Goals Addressed             This Visit's Progress    DIET - EAT MORE FRUITS AND VEGETABLES         Depression Screen    01/20/2022   10:31 AM 10/17/2021   11:39 AM 01/19/2021   11:46 AM 04/21/2020   11:25 AM 10/20/2019   10:19 AM 09/18/2018    5:29 PM 09/10/2017   11:29 AM  PHQ 2/9 Scores  PHQ - 2 Score 2 4  0 2 2 0  PHQ- 9 Score '4 12   2 2   '$ Exception Documentation   Patient refusal        Fall Risk    01/20/2022   10:34 AM 01/19/2021   11:30 AM 04/21/2020   11:25 AM 10/20/2019   10:19 AM 09/18/2018    5:29 PM  Fall Risk   Falls in the past year? 1 0 0 0 0  Number falls in past yr: 1 0 0 0   Injury with Fall? 0  0 0   Risk for fall due to : History of fall(s);Impaired mobility;Impaired balance/gait   No Fall Risks   Follow up Falls evaluation completed;Falls prevention discussed  Falls evaluation completed Falls evaluation completed;Falls prevention discussed     FALL RISK PREVENTION PERTAINING TO THE HOME:  Any stairs in or around the home? No  If so, are there any without handrails? No  Home free of loose throw rugs in walkways, pet beds, electrical cords, etc? Yes  Adequate lighting in your home to reduce risk of falls? Yes   ASSISTIVE DEVICES UTILIZED TO PREVENT FALLS:  Life alert? No   Use of a cane, walker or w/c? Yes  Grab bars in the bathroom? Yes  Shower chair or bench in shower? Yes  Elevated toilet seat or a handicapped toilet? Yes    Cognitive Function:    01/19/2022   11:44 AM 10/20/2019   10:24 AM 09/18/2018    5:29 PM  MMSE - Mini Mental State Exam  Orientation to time '5 5 5  '$ Orientation to Place '4 5 5  '$ Registration '3 3 3  '$ Attention/ Calculation 3 5 0  Recall '2 3 3  '$ Language- name 2 objects 2  0  Language- repeat '1 1 1  '$ Language- follow 3 step command 3  0  Language- read & follow direction 1  0  Write a sentence 1  0  Copy design 0  0  Total score 25  17      10/17/2021   11:41 AM  Montreal Cognitive Assessment   Visuospatial/ Executive (0/5) 2  Naming (0/3) 2  Attention: Read list of digits (0/2) 2  Attention: Read list of letters (0/1) 1  Attention: Serial 7 subtraction starting at 100 (0/3) 2  Language: Repeat phrase (0/2) 2  Language : Fluency (0/1) 1  Abstraction (0/2) 2  Delayed Recall (0/5) 3  Orientation (0/6) 5  Total 22      01/20/2022   10:36 AM  6CIT Screen  What Year? 0 points  What month? 0 points  What time? 0 points  Count back from 20 0 points  Months in reverse 0 points  Repeat phrase 0 points  Total Score 0 points    Immunizations Immunization History  Administered Date(s) Administered   Fluad Quad(high Dose 65+) 02/11/2019   Influenza, High Dose Seasonal PF 03/13/2018   PFIZER(Purple Top)SARS-COV-2 Vaccination 05/26/2019, 06/16/2019, 02/10/2020   Pneumococcal Conjugate-13 10/27/2019   Zoster Recombinat (Shingrix) 07/02/2018, 11/19/2018    TDAP status: Due, Education has been provided regarding the importance of this vaccine. Advised may receive this vaccine at local pharmacy or Health Dept. Aware to provide a copy of the vaccination record if obtained from local pharmacy or Health Dept. Verbalized acceptance and understanding.  Flu Vaccine status: Up to date  Pneumococcal vaccine status: Due, Education  has been provided regarding the importance of this vaccine. Advised may receive this vaccine at local pharmacy or Health Dept. Aware to provide a copy of the vaccination record if obtained from local pharmacy or Health Dept. Verbalized acceptance and understanding.  Covid-19 vaccine status: Completed vaccines  Qualifies for Shingles Vaccine? Yes   Zostavax completed No   Shingrix Completed?: Yes  Screening Tests Health Maintenance  Topic Date Due   TETANUS/TDAP  Never done   COVID-19 Vaccine (4 - Pfizer series) 04/06/2020   Pneumonia Vaccine 37+ Years old (2 - PPSV23 or PCV20) 10/26/2020   INFLUENZA VACCINE  12/06/2021   DEXA SCAN  Completed   Zoster Vaccines- Shingrix  Completed   HPV VACCINES  Aged Out    Health Maintenance  Health Maintenance Due  Topic Date Due   TETANUS/TDAP  Never done   COVID-19 Vaccine (4 - Pfizer series) 04/06/2020   Pneumonia Vaccine 27+ Years old (2 - PPSV23 or PCV20) 10/26/2020   INFLUENZA VACCINE  12/06/2021    Colorectal cancer screening: No longer required.   Mammogram status: No longer required due to age.    Lung Cancer Screening: (Low Dose CT Chest recommended if Age 6-80 years, 30 pack-year currently smoking OR have quit w/in 15years.) does qualify.    Additional Screening:  Hepatitis C Screening: does not qualify; Completed no  Vision Screening: Recommended annual ophthalmology exams for early detection of glaucoma and other disorders of the eye. Is the patient up to date with their annual eye exam?  Yes  Who is the provider or what is the name of the office in which the patient attends annual eye exams? [facility] If pt is not established with a provider, would they like to be referred to a provider to establish care? No .   Dental Screening: Recommended annual dental exams for proper oral hygiene  Community Resource Referral / Chronic Care Management: CRR required this visit?  No   CCM required this visit?  No       Plan:     I have personally reviewed and noted the following in the patient's chart:   Medical and social history Use of alcohol, tobacco or illicit drugs  Current medications and supplements including opioid prescriptions. Patient is not currently taking opioid prescriptions. Functional ability and status Nutritional status Physical activity Advanced directives List of other physicians Hospitalizations, surgeries, and ER visits in previous 12 months Vitals Screenings to include cognitive, depression, and falls Referrals and appointments  In addition, I have reviewed and discussed with patient certain preventive protocols, quality metrics, and best practice recommendations. A written personalized care plan for preventive services as well as general preventive health recommendations were provided to patient.     Dionisio David, LPN   0/30/0923   Nurse Notes: none

## 2022-01-23 ENCOUNTER — Encounter: Payer: Self-pay | Admitting: Neurology

## 2022-01-23 ENCOUNTER — Ambulatory Visit (INDEPENDENT_AMBULATORY_CARE_PROVIDER_SITE_OTHER): Payer: Medicare Other | Admitting: Family Medicine

## 2022-01-23 VITALS — BP 100/60 | HR 71 | Temp 97.0°F | Ht 63.5 in | Wt 142.4 lb

## 2022-01-23 DIAGNOSIS — G319 Degenerative disease of nervous system, unspecified: Secondary | ICD-10-CM

## 2022-01-23 DIAGNOSIS — I679 Cerebrovascular disease, unspecified: Secondary | ICD-10-CM

## 2022-01-23 DIAGNOSIS — M810 Age-related osteoporosis without current pathological fracture: Secondary | ICD-10-CM | POA: Diagnosis not present

## 2022-01-23 DIAGNOSIS — E785 Hyperlipidemia, unspecified: Secondary | ICD-10-CM | POA: Diagnosis not present

## 2022-01-23 DIAGNOSIS — Z23 Encounter for immunization: Secondary | ICD-10-CM | POA: Diagnosis not present

## 2022-01-23 DIAGNOSIS — Z0000 Encounter for general adult medical examination without abnormal findings: Secondary | ICD-10-CM | POA: Diagnosis not present

## 2022-01-23 DIAGNOSIS — G3184 Mild cognitive impairment, so stated: Secondary | ICD-10-CM

## 2022-01-23 LAB — LIPID PANEL
Cholesterol: 198 mg/dL (ref 0–200)
HDL: 81.8 mg/dL (ref >39.00)
LDL Cholesterol: 100 mg/dL — ABNORMAL HIGH (ref 0–99)
NonHDL: 116.05
Total CHOL/HDL Ratio: 2
Triglycerides: 78 mg/dL (ref 0.0–149.0)
VLDL: 15.6 mg/dL (ref 0.0–40.0)

## 2022-01-23 MED ORDER — DONEPEZIL HCL 5 MG PO TABS: 5.0000 mg | ORAL_TABLET | Freq: Every day | ORAL | 1 refills | Status: DC

## 2022-01-23 NOTE — Assessment & Plan Note (Signed)
Saw endocrinologist and decided she didn't want to pursue treatment. Would like to continue vitamins and monitoring

## 2022-01-23 NOTE — Patient Instructions (Addendum)
Consider updating your TDAP vaccine a the pharmacy  Get your covid booster  Next time you come in get your pneumonia

## 2022-01-23 NOTE — Progress Notes (Signed)
Annual Exam   Chief Complaint:  Chief Complaint  Patient presents with   Medicare Wellness    Pt 2    History of Present Illness:  Sherry Lamb is a 87 y.o. No obstetric history on file. who LMP was No LMP recorded. Patient is postmenopausal., presents today for her annual examination.     Nutrition She does get adequate calcium and Vitamin D in her diet. Diet: healthy Exercise: walking - not as often as she should    Social History   Tobacco Use  Smoking Status Never  Smokeless Tobacco Never   Social History   Substance and Sexual Activity  Alcohol Use Yes   Alcohol/week: 1.0 standard drink of alcohol   Types: 1 Glasses of wine per week   Comment: 1 wine day    Social History   Substance and Sexual Activity  Drug Use Never     General Health Dentist in the last year: Yes Eye doctor: yes  Safety The patient wears seatbelts: yes.     The patient feels safe at home and in their relationships: yes.    GYN She is single partner, contraception - post menopausal status.     Social History   Tobacco Use  Smoking Status Never  Smokeless Tobacco Never   Weight Wt Readings from Last 3 Encounters:  01/23/22 142 lb 6 oz (64.6 kg)  01/20/22 143 lb (64.9 kg)  01/19/22 143 lb 6.4 oz (65 kg)   Patient has normal BMI  BMI Readings from Last 1 Encounters:  01/23/22 24.82 kg/m     Chronic disease screening Blood pressure monitoring:  BP Readings from Last 3 Encounters:  01/23/22 100/60  01/19/22 (!) 147/70  11/28/21 140/70    Lipid Monitoring: Indication for screening: age >23, obesity, diabetes, family hx, CV risk factors.  Lipid screening: Yes  Lab Results  Component Value Date   CHOL 184 01/19/2021   HDL 92.00 01/19/2021   LDLCALC 78 01/19/2021   TRIG 70.0 01/19/2021   CHOLHDL 2 01/19/2021     Diabetes Screening: age >34, overweight, family hx, PCOS, hx of gestational diabetes, at risk ethnicity Diabetes Screening screening:  Yes  No results found for: "HGBA1C"   Past Medical History:  Diagnosis Date   Arthritis    Frequent headaches    GERD (gastroesophageal reflux disease)    Hyperlipidemia    Pneumonia    PONV (postoperative nausea and vomiting)     Past Surgical History:  Procedure Laterality Date   ABDOMINAL HYSTERECTOMY     ANKLE SURGERY     EYE SURGERY     Left hip replacement Left 2013   right hip replacement Right 2000   TONSILLECTOMY AND ADENOIDECTOMY  1943   TOTAL KNEE ARTHROPLASTY Right 02/23/2020   Procedure: TOTAL KNEE ARTHROPLASTY;  Surgeon: Gaynelle Arabian, MD;  Location: [facility];  Service: Orthopedics;  Laterality: Right;  34mn   TOTAL KNEE ARTHROPLASTY Left 04/18/2021   Procedure: TOTAL KNEE ARTHROPLASTY;  Surgeon: AGaynelle Arabian MD;  Location: [facility];  Service: Orthopedics;  Laterality: Left;   WISDOM TOOTH EXTRACTION      Prior to Admission medications   Medication Sig Start Date End Date Taking? Authorizing Provider  aspirin EC 81 MG tablet Take 1 tablet by mouth daily at 12 noon.   Yes [provider]  Cholecalciferol (VITAMIN D) 50 MCG (2000 UT) CAPS Take 2,000 Units by mouth daily.   Yes [provider]  donepezil (ARICEPT) 5 MG tablet TAKE  1 TABLET BY MOUTH EVERYDAY AT BEDTIME 12/26/21  Yes Lesleigh Noe, MD  Polyvinyl Alcohol-Povidone (REFRESH OP) Place 1 drop into both eyes 2 (two) times daily.   Yes [provider]  rosuvastatin (CRESTOR) 5 MG tablet TAKE 1 TABLET BY MOUTH DAILY 01/13/22  Yes Lesleigh Noe, MD  Turmeric 500 MG CAPS Take 500 mg by mouth daily.   Yes [provider]    Allergies  Allergen Reactions   Penicillins Other (See Comments)    Stomach tightness.  Tolerated Cephalosporin Date: 02/24/20. Tolerated Cephalosporin Date: 04/19/21.      Prednisone Other (See Comments)    GI intolerance    Gynecologic History: No LMP recorded. Patient is postmenopausal.  Obstetric History: No obstetric history on  file.  Social History   Socioeconomic History   Marital status: Married    Spouse name: [name]   Number of children: 1   Years of education: secretarial school   Highest education level: Not on file  Occupational History   Not on file  Tobacco Use   Smoking status: Never   Smokeless tobacco: Never  Vaping Use   Vaping Use: Never used  Substance and Sexual Activity   Alcohol use: Yes    Alcohol/week: 1.0 standard drink of alcohol    Types: 1 Glasses of wine per week    Comment: 1 wine day    Drug use: Never   Sexual activity: Yes    Partners: Male    Birth control/protection: Post-menopausal, Surgical    Comment: Hysterectomy  Other Topics Concern   Not on file  Social History Narrative   07/07/20   From: [place], [place] to be near family   Living: with husband [name] (1966)   Work: retired from music business - [employer]      Family: daughter Anderson [place] - no children      Enjoys: play tennis (though cannot play), reading, music      Exercise: walking, tries to do her knee therapy   Diet: generally      Safety   Seat belts: Yes    Guns: No   Safe in relationships: Yes    Social Determinants of Health   Financial Resource Strain: Low Risk  (01/20/2022)   Overall Financial Resource Strain (CARDIA)    Difficulty of Paying Living Expenses: Not hard at all  Food Insecurity: No Food Insecurity (01/20/2022)   Hunger Vital Sign    Worried About Running Out of Food in the Last Year: Never true    [place] in the Last Year: Never true  Transportation Needs: No Transportation Needs (01/20/2022)   PRAPARE - Hydrologist (Medical): No    Lack of Transportation (Non-Medical): No  Physical Activity: Sufficiently Active (01/20/2022)   Exercise Vital Sign    Days of Exercise per Week: 4 days    Minutes of Exercise per Session: 40 min  Stress: No Stress Concern Present (01/20/2022)   [place]    Feeling of Stress : Only a little  Social Connections: Moderately Integrated (01/20/2022)   Social Connection and Isolation Panel [NHANES]    Frequency of Communication with Friends and Family: More than three times a week    Frequency of Social Gatherings with Friends and Family: Never    Attends Religious Services: More than 4 times per year    Active Member of [employer] or Organizations:  No    Attends Club or Organization Meetings: Never    Marital Status: Married  Human resources officer Violence: Not At Risk (01/20/2022)   Humiliation, Afraid, Rape, and Kick questionnaire    Fear of Current or Ex-Partner: No    Emotionally Abused: No    Physically Abused: No    Sexually Abused: No    Family History  Problem Relation Age of Onset   Arthritis Mother    Asthma Mother    Hearing loss Mother    Osteoporosis Mother    Heart disease Father    Hyperlipidemia Father    Kidney disease Father    Depression Brother    Hyperlipidemia Brother    Arrhythmia Brother        Psychologist, forensic   Breast cancer Neg Hx     Review of Systems  Constitutional:  Negative for chills and fever.  HENT:  Negative for congestion and sore throat.   Eyes:  Negative for blurred vision and double vision.  Respiratory:  Negative for shortness of breath.   Cardiovascular:  Negative for chest pain.  Gastrointestinal:  Negative for heartburn, nausea and vomiting.  Genitourinary: Negative.   Musculoskeletal: Negative.  Negative for myalgias.  Skin:  Negative for rash.  Neurological:  Negative for dizziness and headaches.  Endo/Heme/Allergies:  Does not bruise/bleed easily.  Psychiatric/Behavioral:  Negative for depression. The patient is not nervous/anxious.      Physical Exam BP 100/60   Pulse 71   Temp (!) 97 F (36.1 C) (Temporal)   Ht 5' 3.5" (1.613 m)   Wt 142 lb 6 oz (64.6 kg)   SpO2 100%   BMI 24.82 kg/m    BP Readings from Last 3 Encounters:  01/23/22  100/60  01/19/22 (!) 147/70  11/28/21 140/70      Physical Exam Constitutional:      General: She is not in acute distress.    Appearance: She is well-developed. She is not diaphoretic.  HENT:     Head: Normocephalic and atraumatic.     Right Ear: External ear normal.     Left Ear: External ear normal.     Nose: Nose normal.  Eyes:     General: No scleral icterus.    Extraocular Movements: Extraocular movements intact.     Conjunctiva/sclera: Conjunctivae normal.  Cardiovascular:     Rate and Rhythm: Normal rate and regular rhythm.     Heart sounds: No murmur heard. Pulmonary:     Effort: Pulmonary effort is normal. No respiratory distress.     Breath sounds: Normal breath sounds. No wheezing.  Abdominal:     General: Bowel sounds are normal. There is no distension.     Palpations: Abdomen is soft. There is no mass.     Tenderness: There is no abdominal tenderness. There is no guarding or rebound.  Musculoskeletal:        General: Normal range of motion.     Cervical back: Neck supple.  Lymphadenopathy:     Cervical: No cervical adenopathy.  Skin:    General: Skin is warm and dry.     Capillary Refill: Capillary refill takes less than 2 seconds.  Neurological:     Mental Status: She is alert and oriented to person, place, and time.     Deep Tendon Reflexes: Reflexes normal.  Psychiatric:        Mood and Affect: Mood normal.        Behavior: Behavior normal.     Results:  PHQ-9:  Flowsheet Row Clinical Support from 01/20/2022 in [facility] [place]  PHQ-9 Total Score 4         Assessment: 87 y.o. No obstetric history on file. female here for routine annual physical examination.  Plan: Problem List Items Addressed This Visit       Cardiovascular and Mediastinum   Cerebral vascular disease   Relevant Medications   donepezil (ARICEPT) 5 MG tablet     Nervous and Auditory   Mild cognitive impairment   Relevant Medications   donepezil  (ARICEPT) 5 MG tablet   Brain atrophy (HCC)   Relevant Medications   donepezil (ARICEPT) 5 MG tablet     Musculoskeletal and Integument   Osteoporosis    Saw endocrinologist and decided she didn't want to pursue treatment. Would like to continue vitamins and monitoring        Other   Hyperlipidemia   Relevant Orders   Lipid panel   Other Visit Diagnoses     Annual physical exam    -  Primary   Need for influenza vaccination       Relevant Orders   Flu Vaccine QUAD High Dose(Fluad) (Completed)       Screening: -- Blood pressure screen normal -- cholesterol screening: will obtain -- Weight screening: normal -- Diabetes Screening: not due for screening -- Nutrition: Encouraged healthy diet  The ASCVD Risk score (Arnett DK, et al., 2019) failed to calculate for the following reasons:   The 2019 ASCVD risk score is only valid for ages 82 to 37  -- Statin therapy for Age 36-75 with CVD risk >7.5%  Psych -- Depression screening (PHQ-9):  Flowsheet Row Clinical Support from 01/20/2022 in [place] [facility]  PHQ-9 Total Score 4        Safety -- tobacco screening: not using -- alcohol screening:  low-risk usage. -- no evidence of domestic violence or intimate partner violence.   Cancer Screening -- aged out  Immunizations Immunization History  Administered Date(s) Administered   Fluad Quad(high Dose 65+) 02/11/2019, 01/23/2022   Influenza, High Dose Seasonal PF 03/13/2018   Influenza,inj,quad, With Preservative 03/17/2017   PFIZER(Purple Top)SARS-COV-2 Vaccination 05/26/2019, 06/16/2019, 02/10/2020   Pneumococcal Conjugate-13 10/27/2019   Pneumococcal-Unspecified 02/14/2014   Zoster Recombinat (Shingrix) 07/02/2018, 11/19/2018    -- flu vaccine up to date -- TDAP q10 years not up to date - records requested -- Shingles (age >66) up to date -- PPSV-23 (19-64 with chronic disease or smoking) not up to date - will get later -- PCV-13 (age >57)  - one dose followed by PPSV-23 1 year later up to date -- Covid-19 Vaccine up to date   Encouraged healthy diet and exercise. Encouraged regular vision and dental care.    Lesleigh Noe, MD

## 2022-01-23 NOTE — Assessment & Plan Note (Signed)
They note slight improvement. Saw Neurology and scheduled for neuropsych. Cont aricept.

## 2022-02-15 ENCOUNTER — Ambulatory Visit: Payer: Medicare Other | Admitting: Podiatry

## 2022-02-15 DIAGNOSIS — M79675 Pain in left toe(s): Secondary | ICD-10-CM

## 2022-02-15 DIAGNOSIS — B351 Tinea unguium: Secondary | ICD-10-CM

## 2022-02-15 DIAGNOSIS — M79674 Pain in right toe(s): Secondary | ICD-10-CM

## 2022-02-15 NOTE — Patient Instructions (Signed)
Look for urea 40% gel and apply to the thickened nails. This can be bought over the counter, at a pharmacy or online such as [employer].

## 2022-02-19 NOTE — Progress Notes (Signed)
  Subjective:  Patient ID: Sherry Lamb, female    DOB: Jun 02, 1934,  MRN: [id number]  Chief Complaint  Patient presents with   Nail Problem    Thick painful toenails, 3 month follow up     87 y.o. female presents with the above complaint. History confirmed with patient.  Her nails are thickened and elongated and painful.  Debridement has been helpful.  She reports no other new issues  Objective:  Physical Exam: warm, good capillary refill, no trophic changes or ulcerative lesions, normal DP and PT pulses and normal sensory exam.  She has thickened and elongated yellow toenails x10 with subungual debris Assessment:   1. Pain due to onychomycosis of toenails of both feet      Plan:  Patient was evaluated and treated and all questions answered.   Discussed the etiology and treatment options for the condition in detail with the patient. Educated patient on the topical and oral treatment options for mycotic nails. Recommended debridement of the nails today. Sharp and mechanical debridement performed of all painful and mycotic nails today. Nails debrided in length and thickness using a nail nipper to level of comfort. Discussed treatment options including appropriate shoe gear. Follow up as needed for painful nails.    Return in about 9 weeks (around 04/19/2022).

## 2022-03-02 ENCOUNTER — Encounter: Payer: Self-pay | Admitting: Neurology

## 2022-03-13 ENCOUNTER — Ambulatory Visit: Payer: Medicare Other | Admitting: Family

## 2022-03-13 ENCOUNTER — Encounter: Payer: Self-pay | Admitting: Family

## 2022-03-13 VITALS — BP 116/70 | HR 70 | Temp 98.0°F | Resp 16 | Ht 63.5 in | Wt 142.0 lb

## 2022-03-13 DIAGNOSIS — G3184 Mild cognitive impairment, so stated: Secondary | ICD-10-CM

## 2022-03-13 DIAGNOSIS — F32A Depression, unspecified: Secondary | ICD-10-CM

## 2022-03-13 DIAGNOSIS — F320 Major depressive disorder, single episode, mild: Secondary | ICD-10-CM | POA: Diagnosis not present

## 2022-03-13 NOTE — Patient Instructions (Signed)
A referral was placed today for psychology.  Please let us know if you have not heard back within 2 weeks about the referral.   Regards,   [place] Urieta FNP-C

## 2022-03-13 NOTE — Assessment & Plan Note (Signed)
F/u with neuropsych as scheduled.  I do believe that there might be depressive component to her memory issues, and also recommended psychologist. Referral placed for this.   In the future, we could consider adding mirtazipine/remeron.

## 2022-03-13 NOTE — Assessment & Plan Note (Signed)
Continue aricept 5 mg.  Continue f/u with neurology as scheduled.

## 2022-03-13 NOTE — Progress Notes (Signed)
Established Patient Office Visit  Subjective:  Patient ID: Sherry Lamb, female    DOB: August 02, 1934  Age: 87 y.o. MRN: [id number]  CC:  Chief Complaint  Patient presents with   Transitions Of Care    HPI Sherry Lamb is here for a transition of care visit.accompanied by her husband.   Prior provider was: Sherry Pancoast FNP  Pt is without acute concerns.   Acute concerns:  She has appointment this Thursday to see a neurologist, for some testing/ memory testing. Has appt with Dr. Jaynee Lamb upcoming in january as well as visit this week for neuro-psych (Dr. Alphonzo Lamb) . She has been started on aricept 5 mg. MRI 7/23 with mild change in white matter, ischemia recommended.   She does note some depression, since moving here she finds it hard to meet friends and she finds herself feeling socially isolated. She has since lost her two last pastors, which she found to be disheartening.   Vitamin b12 def: takes 1000 mcg once daily, tolerating well.   Hyperlipidemia: was taking crestor daily however has cut down to every other day.   Past Medical History:  Diagnosis Date   Arthritis    Frequent headaches    GERD (gastroesophageal reflux disease)    Hyperlipidemia    Pneumonia    PONV (postoperative nausea and vomiting)     Past Surgical History:  Procedure Laterality Date   ABDOMINAL HYSTERECTOMY     ANKLE SURGERY     EYE SURGERY     Left hip replacement Left 2013   right hip replacement Right 2000   TONSILLECTOMY AND ADENOIDECTOMY  1943   TOTAL KNEE ARTHROPLASTY Right 02/23/2020   Procedure: TOTAL KNEE ARTHROPLASTY;  Surgeon: Gaynelle Arabian, MD;  Location: [facility];  Service: Orthopedics;  Laterality: Right;  76mn   TOTAL KNEE ARTHROPLASTY Left 04/18/2021   Procedure: TOTAL KNEE ARTHROPLASTY;  Surgeon: AGaynelle Arabian MD;  Location: [facility];  Service: Orthopedics;  Laterality: Left;   WISDOM TOOTH EXTRACTION      Family History  Problem Relation Age of Onset   Arthritis  Mother    Asthma Mother    Hearing loss Mother    Osteoporosis Mother    Heart disease Father    Hyperlipidemia Father    Kidney disease Father    Depression Brother    Hyperlipidemia Brother    Arrhythmia Brother        pPsychologist, forensic  Breast cancer Neg Hx     Social History   Socioeconomic History   Marital status: Married    Spouse name: Sherry Lamb  Number of children: 1   Years of education: secretarial school   Highest education level: Not on file  Occupational History    Employer: OTHER  Tobacco Use   Smoking status: Never   Smokeless tobacco: Never  Vaping Use   Vaping Use: Never used  Substance and Sexual Activity   Alcohol use: Yes    Alcohol/week: 1.0 standard drink of alcohol    Types: 1 Glasses of wine per week    Comment: 1 wine day    Drug use: Never   Sexual activity: Yes    Partners: Male    Birth control/protection: Post-menopausal, Surgical    Comment: Hysterectomy  Other Topics Concern   Not on file  Social History Narrative   07/07/20   From: [place] to be near family   Living: with husband Sherry Lamb(1966)   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 [employer]      Family: daughter - Sherry Lamb - no children      Enjoys: play tennis (though cannot play), reading, music      Exercise: walking, tries to do her knee therapy   Diet: generally      Safety   Seat belts: Yes    Guns: No   Safe in relationships: Yes    Social Determinants of Health   Financial Resource Strain: Low Risk  (01/20/2022)   Overall Financial Resource Strain (CARDIA)    Difficulty of Paying Living Expenses: Not hard at all  Food Insecurity: No Food Insecurity (01/20/2022)   Hunger Vital Sign    Worried About Running Out of Food in the Last Year: Never true    Ran Out of Food in the Last Year: Never true  Transportation Needs: No Transportation Needs (01/20/2022)   PRAPARE - Hydrologist (Medical): No    Lack of  Transportation (Non-Medical): No  Physical Activity: Sufficiently Active (01/20/2022)   Exercise Vital Sign    Days of Exercise per Week: 4 days    Minutes of Exercise per Session: 40 min  Stress: No Stress Concern Present (01/20/2022)   [place]    Feeling of Stress : Only a little  Social Connections: Moderately Integrated (01/20/2022)   Social Connection and Isolation Panel [NHANES]    Frequency of Communication with Friends and Family: More than three times a week    Frequency of Social Gatherings with Friends and Family: Never    Attends Religious Services: More than 4 times per year    Active Member of [employer] or Organizations: No    Attends Archivist Meetings: Never    Marital Status: Married  Human resources officer Violence: Not At Risk (01/20/2022)   Humiliation, Afraid, Rape, and Kick questionnaire    Fear of Current or Ex-Partner: No    Emotionally Abused: No    Physically Abused: No    Sexually Abused: No    Outpatient Medications Prior to Visit  Medication Sig Dispense Refill   aspirin EC 81 MG tablet Take 1 tablet by mouth daily at 12 noon.     Cholecalciferol (VITAMIN D) 50 MCG (2000 UT) CAPS Take 2,000 Units by mouth daily.     donepezil (ARICEPT) 5 MG tablet Take 1 tablet (5 mg total) by mouth at bedtime. 90 tablet 1   Polyvinyl Alcohol-Povidone (REFRESH OP) Place 1 drop into both eyes 2 (two) times daily.     rosuvastatin (CRESTOR) 5 MG tablet TAKE 1 TABLET BY MOUTH DAILY 90 tablet 0   Turmeric 500 MG CAPS Take 500 mg by mouth daily.     No facility-administered medications prior to visit.    Allergies  Allergen Reactions   Penicillins Other (See Comments)    Stomach tightness.  Tolerated Cephalosporin Date: 02/24/20. Tolerated Cephalosporin Date: 04/19/21.      Prednisone Other (See Comments)    GI intolerance        Objective:    Physical Exam Constitutional:      General:  She is not in acute distress.    Appearance: Normal appearance. She is not ill-appearing, toxic-appearing or diaphoretic.  Cardiovascular:     Rate and Rhythm: Normal rate and regular rhythm.  Pulmonary:     Effort: Pulmonary effort is normal.  Neurological:     General: No focal deficit present.     Mental  Status: She is alert and oriented to person, place, and time. Mental status is at baseline.  Psychiatric:        Mood and Affect: Mood normal.        Behavior: Behavior normal.        Thought Content: Thought content normal.        Judgment: Judgment normal.      BP 116/70   Pulse 70   Temp 98 F (36.7 C)   Resp 16   Ht 5' 3.5" (1.613 m)   Wt 142 lb (64.4 kg)   SpO2 100%   BMI 24.76 kg/m  Wt Readings from Last 3 Encounters:  03/13/22 142 lb (64.4 kg)  01/23/22 142 lb 6 oz (64.6 kg)  01/20/22 143 lb (64.9 kg)     Health Maintenance Due  Topic Date Due   TETANUS/TDAP  Never done   COVID-19 Vaccine (4 - Pfizer series) 04/06/2020   Pneumonia Vaccine 57+ Years old (2 - PPSV23 or PCV20) 10/26/2020    There are no preventive care reminders to display for this patient.  Lab Results  Component Value Date   TSH 1.40 10/17/2021   Lab Results  Component Value Date   WBC 5.1 10/17/2021   HGB 13.7 10/17/2021   HCT 41.4 10/17/2021   MCV 90.8 10/17/2021   PLT 200.0 10/17/2021   Lab Results  Component Value Date   NA 140 10/17/2021   K 4.3 10/17/2021   CO2 29 10/17/2021   GLUCOSE 94 10/17/2021   BUN 23 10/17/2021   CREATININE 0.59 10/17/2021   BILITOT 0.7 10/17/2021   ALKPHOS 67 10/17/2021   AST 24 10/17/2021   ALT 12 10/17/2021   PROT 7.0 10/17/2021   ALBUMIN 4.1 10/17/2021   CALCIUM 9.9 10/17/2021   ANIONGAP 5 04/19/2021   GFR 82.39 10/17/2021   Lab Results  Component Value Date   CHOL 198 01/23/2022   Lab Results  Component Value Date   HDL 81.80 01/23/2022   Lab Results  Component Value Date   LDLCALC 100 (H) 01/23/2022   Lab Results   Component Value Date   TRIG 78.0 01/23/2022   Lab Results  Component Value Date   CHOLHDL 2 01/23/2022   No results found for: "HGBA1C"    Assessment & Plan:   Problem List Items Addressed This Visit       Other   Mild cognitive impairment    Continue aricept 5 mg.  Continue f/u with neurology as scheduled.        Depression, major, single episode, mild ([place])    F/u with neuropsych as scheduled.  I do believe that there might be depressive component to her memory issues, and also recommended psychologist. Referral placed for this.   In the future, we could consider adding mirtazipine/remeron.        Other Visit Diagnoses     Depression, unspecified depression type    -  Primary   Relevant Orders   Ambulatory referral to Psychology       No orders of the defined types were placed in this encounter.   Follow-up: Return in about 3 months (around 06/13/2022) for f/u cholesterol.    Sherry Pancoast, FNP

## 2022-03-23 ENCOUNTER — Telehealth: Payer: Self-pay | Admitting: Family

## 2022-03-23 DIAGNOSIS — G3184 Mild cognitive impairment, so stated: Secondary | ICD-10-CM

## 2022-03-23 DIAGNOSIS — G319 Degenerative disease of nervous system, unspecified: Secondary | ICD-10-CM

## 2022-03-23 DIAGNOSIS — I679 Cerebrovascular disease, unspecified: Secondary | ICD-10-CM

## 2022-03-23 NOTE — Telephone Encounter (Signed)
  Encourage patient to contact the pharmacy for refills or they can request refills through [facility]  Did the patient contact the pharmacy: No  LAST APPOINTMENT DATE: 03/13/2022  NEXT APPOINTMENT DATE: 05/04/2022  MEDICATION: donepezil (ARICEPT) 5 MG tablet   Is the patient out of medication? No  If not, how much is left? 3 left  PHARMACY: [id number] [place],  - [address]  Let patient know to contact pharmacy at the end of the day to make sure medication is ready.  Please notify patient to allow 48-72 hours to process

## 2022-03-24 ENCOUNTER — Other Ambulatory Visit: Payer: Self-pay

## 2022-03-24 MED ORDER — DONEPEZIL HCL 5 MG PO TABS: 5.0000 mg | ORAL_TABLET | Freq: Every day | ORAL | 1 refills | Status: DC

## 2022-03-24 NOTE — Addendum Note (Signed)
Addended by: Eugenia Pancoast on: 03/24/2022 11:43 AM   Modules accepted: Orders

## 2022-03-24 NOTE — Telephone Encounter (Signed)
Refilled

## 2022-03-28 ENCOUNTER — Other Ambulatory Visit: Payer: Self-pay

## 2022-03-28 MED ORDER — ROSUVASTATIN CALCIUM 5 MG PO TABS: 5.0000 mg | ORAL_TABLET | Freq: Every day | ORAL | 0 refills | Status: DC

## 2022-04-19 ENCOUNTER — Telehealth: Payer: Self-pay | Admitting: Family Medicine

## 2022-04-19 ENCOUNTER — Ambulatory Visit: Payer: Medicare Other | Admitting: Podiatry

## 2022-04-19 DIAGNOSIS — M79674 Pain in right toe(s): Secondary | ICD-10-CM | POA: Diagnosis not present

## 2022-04-19 DIAGNOSIS — B351 Tinea unguium: Secondary | ICD-10-CM

## 2022-04-19 DIAGNOSIS — M79675 Pain in left toe(s): Secondary | ICD-10-CM

## 2022-04-19 NOTE — Progress Notes (Signed)
  Subjective:  Patient ID: Sherry Lamb, female    DOB: October 13, 1934,  MRN: [id number]  Chief Complaint  Patient presents with   Nail Problem    Thick painful toenails, 63 day follow up    87 y.o. female presents with the above complaint. History confirmed with patient.  Her nails are thickened and elongated and painful.  Debridement has been helpful.  She reports no other new issues  Objective:  Physical Exam: warm, good capillary refill, no trophic changes or ulcerative lesions, normal DP and PT pulses and normal sensory exam.  She has thickened and elongated yellow toenails x10 with subungual debris Assessment:   1. Pain due to onychomycosis of toenails of both feet      Plan:  Patient was evaluated and treated and all questions answered.   Discussed the etiology and treatment options for the condition in detail with the patient. Educated patient on the topical and oral treatment options for mycotic nails. Recommended debridement of the nails today. Sharp and mechanical debridement performed of all painful and mycotic nails today. Nails debrided in length and thickness using a nail nipper to level of comfort. Discussed treatment options including appropriate shoe gear. Follow up as needed for painful nails.    Return in about 10 weeks (around 06/28/2022) for painful nails .

## 2022-04-19 NOTE — Telephone Encounter (Signed)
Pt called stating she saw Tabitha on 03/13/22 & discussed a referral being sent to [facility] for therapy. Pt states she never heard back with a update on referral. I saw where the referral was made, however it was closed. Pt is asking for another referral? Call back # [phone]

## 2022-04-20 NOTE — Telephone Encounter (Signed)
Looking thru EMR; [facility] sent patient a Pharmacist, community message with number to schedule appt with her. I called patient and left VM for her with number to call and schedule her appt.

## 2022-05-04 ENCOUNTER — Encounter: Payer: Medicare Other | Admitting: Family

## 2022-05-24 ENCOUNTER — Ambulatory Visit: Payer: Medicare Other | Admitting: Neurology

## 2022-05-24 ENCOUNTER — Encounter: Payer: Self-pay | Admitting: Neurology

## 2022-05-24 VITALS — BP 129/81 | HR 87 | Ht 63.0 in | Wt 145.4 lb

## 2022-05-24 DIAGNOSIS — G309 Alzheimer's disease, unspecified: Secondary | ICD-10-CM | POA: Diagnosis not present

## 2022-05-24 DIAGNOSIS — R413 Other amnesia: Secondary | ICD-10-CM

## 2022-05-24 DIAGNOSIS — F32A Depression, unspecified: Secondary | ICD-10-CM

## 2022-05-24 DIAGNOSIS — R4189 Other symptoms and signs involving cognitive functions and awareness: Secondary | ICD-10-CM

## 2022-05-24 DIAGNOSIS — F419 Anxiety disorder, unspecified: Secondary | ICD-10-CM

## 2022-05-24 DIAGNOSIS — F0280 Dementia in other diseases classified elsewhere without behavioral disturbance: Secondary | ICD-10-CM

## 2022-05-24 DIAGNOSIS — R4689 Other symptoms and signs involving appearance and behavior: Secondary | ICD-10-CM

## 2022-05-24 MED ORDER — MEMANTINE HCL 10 MG PO TABS: ORAL_TABLET | ORAL | 4 refills | Status: DC

## 2022-05-24 MED ORDER — DONEPEZIL HCL 10 MG PO TABS: 10.0000 mg | ORAL_TABLET | Freq: Every day | ORAL | 3 refills | Status: DC

## 2022-05-24 NOTE — Patient Instructions (Addendum)
Consider Driving evaluation Consider Moving into an assisted living facility or independent facility Consider Therapy and psychiatry - Eino Farber at [facility] or talk to tabitha , think about starting a medication to help with mood Increase Aricept/Donepezil to '10mg'$  once a day for 2 weeks and if tolerated start Namenda Start Namenda/Memantine titrate to '10mg'$  twice: Start with one pill at bedtime for 2 weeks and if no side effects can increase to '10mg'$  twice daily Lecanemab - Think about [place] "XX Brain" [facility] MIND diet [facility] Study at [place] PET Scan for dementia : Value of FDG-PET in the Evaluation of AD FDG-PET has been proven to be a promising modality for detecting functional brain changes in AD, identifying changes in early AD, and helping to differentiate AD from other causes of dementia. Many studies have been published evaluating the value of FDG-PET in AD for the last 3 decades. A meta-analysis including 27 studies evaluating FDG-PET in the diagnosis of AD resulted in a pooled sensitivity (SN) of 91% (95% confidence interval [CI], 86%-94%] and specificity (SP) of 86% (95% CI, 79%-91%). The analysis included 119 studies evaluating the role of different modalities in the diagnosis of AD. The results from the meta-analysis showed that FDG-PET has superior diagnostic accuracy in comparison with the other available diagnostic methods such as clinical guidelines, MRI, CT, SPECT, and biomarkers.24 Studies have also shown that FDG-PET has the potential to differentiate patients with AD from normal subjects and patients with other causes of dementia. A study by Mosconi et al25 has shown that FDG-PET can differentiate patients with AD from normal subjects with an SN and SP of 99% and 98%, respectively, from patients with DLB with an SN and SP of 99% and 71%, respectively, and from patients with FTD with an SN and SP of 99% and 65%, respectively.  Amyloid PET Scan  - consider  this if you want to try Lecanemab  Performance across neurocognitive testing is not a strong predictor of an individual's safety operating a motor vehicle. Should her family wish to pursue a formalized driving evaluation, they could reach out to the following agencies:   [employer] in [place]: [phone]   [facility]: [facility]: [place]: [phone] or [phone]   Lecanemab Injection What is this medication? LECANEMAB (lek AN e mab) treats Alzheimer disease. It works by decreasing the buildup of amyloid, a protein that may cause Alzheimer disease. This may slow down the worsening of symptoms. It is a monoclonal antibody. This medicine may be used for other purposes; ask your health care provider or pharmacist if you have questions. COMMON BRAND NAME(S): LEQEMBI What should I tell my care team before I take this medication? They need to know if you have any of these conditions: An unusual or allergic reaction to lecanemab, other medications, foods, dyes, or preservatives Take medications that treat or prevent blood clots Pregnant or trying to get pregnant Breast-feeding How should I use this medication? This medication is injected into a vein. It is given by your care team in a hospital or clinic setting. A special MedGuide will be given to you before each treatment. Be sure to read this information carefully each time. Talk to your care team about the use of this medication in children. Special care may be needed. Overdosage: If you think you have taken too much of this medicine contact a poison control center or emergency room at once.  NOTE: This medicine is only for you. Do not share this medicine with others. What if I miss a dose? Keep appointments for follow-up doses. It is important not to miss your dose. Call your care team if you are unable to keep an appointment. What may interact  with this medication? Interactions have not been studied. This list may not describe all possible interactions. Give your health care provider a list of all the medicines, herbs, non-prescription drugs, or dietary supplements you use. Also tell them if you smoke, drink alcohol, or use illegal drugs. Some items may interact with your medicine. What should I watch for while using this medication? Visit your care team for regular checks on your progress. Tell your care team if your symptoms do not start to get better or if they get worse. What side effects may I notice from receiving this medication? Side effects that you should report to your care team as soon as possible: Allergic reactions or angioedema--skin rash, itching or hives, swelling of the face, eyes, lips, tongue, arms, or legs, trouble swallowing or breathing Headache, worsening confusion, dizziness, change in vision, nausea, seizures Infusion reactions--chest pain, shortness of breath or trouble breathing, feeling faint or lightheaded Side effects that usually do not require medical attention (report these to your care team if they continue or are bothersome): Cough Diarrhea Headache This list may not describe all possible side effects. Call your doctor for medical advice about side effects. You may report side effects to FDA at 1-800-FDA-1088. Where should I keep my medication? This medication is given in a hospital or clinic. It will not be stored at home. NOTE: This sheet is a summary. It may not cover all possible information. If you have questions about this medicine, talk to your doctor, pharmacist, or health care provider.  2023 Elsevier/Gold Standard (2021-05-24 00:00:00)  Memantine Tablets What is this medication? MEMANTINE (MEM an teen) treats memory loss and confusion (dementia) in people who have Alzheimer disease. It works by improving attention, memory, and the ability to engage in daily activities. It is not a cure  for dementia or Alzheimer disease. This medicine may be used for other purposes; ask your health care provider or pharmacist if you have questions. COMMON BRAND NAME(S): Namenda What should I tell my care team before I take this medication? They need to know if you have any of these conditions: Kidney disease Liver disease Seizures Trouble passing urine An unusual or allergic reaction to memantine, other medications, foods, dyes, or preservatives Pregnant or trying to get pregnant Breast-feeding How should I use this medication? Take this medication by mouth with water. Follow the directions on the prescription label. You may take this medication with or without food. Take your doses at regular intervals. Do not take your medication more often than directed. Continue to take your medication even if you feel better. Do not stop taking except on the advice of your care team. Talk to your care team about the use of this medication in children. Special care may be needed Overdosage: If you think you have taken too much of this medicine contact a poison control center or emergency room at once. NOTE: This medicine is only for you. Do not share this medicine with others. What if I miss a dose? If you miss a dose, take it as soon as you can. If it is almost time for your next dose, take only that dose. Do not take double or extra doses. If you do not  take your medication for several days, contact your care team. Your dose may need to be changed. What may interact with this medication? Acetazolamide Amantadine Cimetidine Dextromethorphan Dofetilide Hydrochlorothiazide Ketamine Metformin Methazolamide Quinidine Ranitidine Sodium bicarbonate Triamterene This list may not describe all possible interactions. Give your health care provider a list of all the medicines, herbs, non-prescription drugs, or dietary supplements you use. Also tell them if you smoke, drink alcohol, or use illegal drugs.  Some items may interact with your medicine. What should I watch for while using this medication? Visit your care team for regular checks on your progress. Check with your care team if there is no improvement in your symptoms or if they get worse. This medication may affect your coordination, reaction time, or judgment. Do not drive or operate machinery until you know how this medication affects you. Sit up or stand slowly to reduce the risk of dizzy or fainting spells. Drinking alcohol with this medication can increase the risk of these side effects. What side effects may I notice from receiving this medication? Side effects that you should report to your care team as soon as possible: Allergic reactions--skin rash, itching, hives, swelling of the face, lips, tongue, or throat Side effects that usually do not require medical attention (report to your care team if they continue or are bothersome): Confusion Constipation Diarrhea Dizziness Headache This list may not describe all possible side effects. Call your doctor for medical advice about side effects. You may report side effects to FDA at 1-800-FDA-1088. Where should I keep my medication? Keep out of the reach of children. Store at room temperature between 15 degrees and 30 degrees C (59 degrees and 86 degrees F). Throw away any unused medication after the expiration date. NOTE: This sheet is a summary. It may not cover all possible information. If you have questions about this medicine, talk to your doctor, pharmacist, or health care provider.  2023 Elsevier/Gold Standard (2021-05-12 00:00:00) Donepezil Tablets What is this medication? DONEPEZIL (doe NEP e zil) treats memory loss and confusion (dementia) in people who have Alzheimer disease. It works by improving attention, memory, and the ability to engage in daily activities. It is not a cure for dementia or Alzheimer disease. This medicine may be used for other purposes; ask your health  care provider or pharmacist if you have questions. COMMON BRAND NAME(S): Aricept What should I tell my care team before I take this medication? They need to know if you have any of these conditions: Asthma or other lung disease Difficulty passing urine Head injury Heart disease History of irregular heartbeat Liver disease Seizures (convulsions) Stomach or intestinal disease, ulcers or stomach bleeding An unusual or allergic reaction to donepezil, other medications, foods, dyes, or preservatives Pregnant or trying to get pregnant Breast-feeding How should I use this medication? Take this medication by mouth with a glass of water. Follow the directions on the prescription label. You may take this medication with or without food. Take this medication at regular intervals. This medication is usually taken before bedtime. Do not take it more often than directed. Continue to take your medication even if you feel better. Do not stop taking except on your care team's advice. If you are taking the 23 mg donepezil tablet, swallow it whole; do not cut, crush, or chew it. Talk to your care team about the use of this medication in children. Special care may be needed. Overdosage: If you think you have taken too much of this medicine contact  a poison control center or emergency room at once. NOTE: This medicine is only for you. Do not share this medicine with others. What if I miss a dose? If you miss a dose, take it as soon as you can. If it is almost time for your next dose, take only that dose, do not take double or extra doses. What may interact with this medication? Do not take this medication with any of the following: Certain medications for fungal infections like itraconazole, fluconazole, posaconazole, and voriconazole Cisapride Dextromethorphan; quinidine Dronedarone Pimozide Quinidine Thioridazine This medication may also interact with the following: Antihistamines for allergy, cough  and cold Atropine Bethanechol Carbamazepine Certain medications for bladder problems like oxybutynin, tolterodine Certain medications for Parkinson's disease like benztropine, trihexyphenidyl Certain medications for stomach problems like dicyclomine, hyoscyamine Certain medications for travel sickness like scopolamine Dexamethasone Dofetilide Ipratropium NSAIDs, medications for pain and inflammation, like ibuprofen or naproxen Other medications for Alzheimer's disease Other medications that prolong the QT interval (cause an abnormal heart rhythm) Phenobarbital Phenytoin Rifampin, rifabutin or rifapentine Ziprasidone This list may not describe all possible interactions. Give your health care provider a list of all the medicines, herbs, non-prescription drugs, or dietary supplements you use. Also tell them if you smoke, drink alcohol, or use illegal drugs. Some items may interact with your medicine. What should I watch for while using this medication? Visit your care team for regular checks on your progress. Check with your care team if your symptoms do not get better or if they get worse. You may get drowsy or dizzy. Do not drive, use machinery, or do anything that needs mental alertness until you know how this medication affects you. What side effects may I notice from receiving this medication? Side effects that you should report to your care team as soon as possible: Allergic reactions--skin rash, itching, hives, swelling of the face, lips, tongue, or throat Peptic ulcer--burning stomach pain, loss of appetite, bloating, burping, heartburn, nausea, vomiting Seizures Slow heartbeat--dizziness, feeling faint or lightheaded, confusion, trouble breathing, unusual weakness or fatigue Stomach bleeding--bloody or black, tar-like stools, vomiting blood or brown material that looks like coffee grounds Trouble passing urine Side effects that usually do not require medical attention (report  these to your care team if they continue or are bothersome): Diarrhea Fatigue Loss of appetite Muscle pain or cramps Nausea Trouble sleeping This list may not describe all possible side effects. Call your doctor for medical advice about side effects. You may report side effects to FDA at 1-800-FDA-1088. Where should I keep my medication? Keep out of reach of children. Store at room temperature between 15 and 30 degrees C (59 and 86 degrees F). Throw away any unused medication after the expiration date. NOTE: This sheet is a summary. It may not cover all possible information. If you have questions about this medicine, talk to your doctor, pharmacist, or health care provider.  2023 Elsevier/Gold Standard (2020-11-11 00:00:00)  Alzheimer's Disease Alzheimer's disease is a brain disease that affects memory, thinking, language, and behavior. People with Alzheimer's disease lose mental abilities, and the disease gets worse over time. Alzheimer's disease is a form of dementia. What are the causes? This condition develops when a protein called beta-amyloid forms deposits in the brain. It is not known what causes these deposits to form. Alzheimer's disease may also be caused by a gene mutation that is inherited from one parent or both parents. A gene mutation is a harmful change in a gene. Not everyone who inherits  the genetic mutation will get the disease. What increases the risk? You are more likely to develop this condition if you: Are older than age 9. Are female. Have any of these medical conditions: High blood pressure. Diabetes. Heart or blood vessel disease. Smoke. Have obesity. Have had a brain injury. Have had a stroke. Have a family history of dementia. What are the signs or symptoms? Symptoms of this condition may happen in three stages, which often overlap. Early stage In this stage, you may continue to be independent. You may still be able to drive, work, and be social.  Symptoms in this stage include: Minor memory problems, such as forgetting a name, words, or what you did recently. Difficulty with: Paying attention. Communicating. Doing familiar tasks. Problem solving or doing calculations. Following instructions. Learning new things. Anxiety. Social withdrawal. Loss of motivation. Moderate stage In this stage, you will start to need care. Symptoms in this stage include: Difficulty with expressing thoughts. Memory loss that affects daily life. This can include forgetting: Recent events that have happened. If you have taken medicines or eaten. Familiar places. You may get lost while walking or driving. To pay bills or manage finances. Personal hygiene such as bathing or using the bathroom. Confusion about where you are or what time it is. Difficulty in judging distance. Changes in personality, mood, and behavior. You may be moody, irritable, angry, frustrated, fearful, anxious, or suspicious. Poor reasoning and judgment. Delusions or hallucinations. Changes in sleep patterns. Severe stage In the final stage, you will need help with your personal care and daily activities. Symptoms in this stage include: Worsening memory loss. Personality changes. Loss of awareness of your surroundings. Changes in physical abilities, including the ability to walk, sit, and swallow. Difficulty in communicating. Inability to control your bladder and bowels. Increasing confusion. Increasing behavior changes. How is this diagnosed?  This condition is diagnosed by a health care provider who specializes in diseases of the nervous system (neurologist) or one who specializes in care of the elderly (geriatrician or geriatric psychiatrist). Other causes of dementia may also be ruled out. Your health care provider will talk with you and your family, friends, or caregivers about your history and symptoms. A thorough medical history will be taken, and you will have a  physical exam and tests. Tests may include: Lab tests, such as blood or urine tests. Imaging tests, such as a CT scan, a PET scan, or an MRI. A lumbar puncture. This test involves removing and testing a small amount of the fluid that surrounds the brain and spinal cord. An electroencephalogram (EEG). In this test, small metal discs are used to measure electrical activity in the brain. Memory tests, cognitive tests, and neuropsychological tests. These tests evaluate brain function. Genetic testing. This may be done if you have early onset of the disease (before age 71) or if other family members have the disease. How is this treated? At this time, there is no treatment to cure Alzheimer's disease or stop it from getting worse. The goals of treatment are: To manage behavioral changes. To provide you with a safe environment. To help manage daily life for you and your caregivers. The following treatment options are available: Medicines. Medicines may help the memory work better and manage behavioral symptoms. Cognitive therapy. Cognitive therapy provides you with education, support, and memory aids. It is most helpful in the early stages of the condition. Counseling or spiritual guidance. It is normal to have a lot of feelings, including anger, relief,  fear, and isolation. Counseling and guidance can help you deal with these feelings. Caregiving. This involves having caregivers help you with your daily activities. Family support groups. These provide education, emotional support, and information about community resources to family members who are taking care of you. Follow these instructions at home:  Medicines Take over-the-counter and prescription medicines only as told by your health care provider. Use a pill organizer or pill reminder to help you manage your medicines. Avoid taking medicines that can affect thinking, such as pain medicines or sleeping medicines. Lifestyle Make healthy  lifestyle choices: Be physically active as told by your health care provider. Regular exercise may help improve symptoms. Do not use any products that contain nicotine or tobacco, such as cigarettes, e-cigarettes, and chewing tobacco. If you need help quitting, ask your health care provider. Do not drink alcohol. Eat a healthy diet. Practice stress-management techniques when you get stressed. Stay social. Drink enough fluid to keep your urine pale yellow. Make sure to get quality sleep. Avoid taking long naps during the day. Take short naps of 30 minutes or less if needed. Keep your sleeping area dark and cool. Avoid exercising during the few hours before you go to bed. Avoid caffeine products in the afternoon and evening. General instructions Work with your health care provider to determine what you need help with and what your safety needs are. If you were given a bracelet that identifies you as a person with memory loss or tracks your location, make sure to wear it at all times. Talk with your health care provider about whether it is safe for you to drive. Work with your family to make important decisions, such as advance directives, medical power of attorney, or a living will. Keep all follow-up visits. This is important. Where to find more information The Alzheimer's Association: Call the 24-hour helpline at 1-253-427-3230, or visit [employer] Contact a health care provider if: You have nausea, vomiting, or trouble with eating related to a medicine. You have worsening mood or behavior changes, such as depression, anxiety, or hallucinations. You or your family members become concerned for your safety. Get help right away if: You become less responsive or are difficult to wake up. Your memory suddenly gets worse. You feel that you want to harm yourself. If you ever feel like you may hurt yourself or others, or have thoughts about taking your own life, get help right away. Go to your  nearest emergency department or: Call your local emergency services (911 in the [place] Call a suicide crisis helpline, such as the [place] at 262-544-2544 or 988 in the [place]. This is open 24 hours a day in the [place] Text the Crisis Text Line at 640-738-2142 (in the [place].). Summary Alzheimer's disease is a brain disease that affects memory, thinking, language, and behavior. Alzheimer's disease is a form of dementia. This condition is diagnosed by a specialist in diseases of the nervous system (neurologist) or one who specializes in care of the elderly. At this time, there is no treatment to cure Alzheimer's disease or stop it from getting worse. The goal of treatment is to help you manage any symptoms. Work with your family to make important decisions, such as advance directives, medical power of attorney, or a living will. This information is not intended to replace advice given to you by your health care provider. Make sure you discuss any questions you have with your health care provider. Document Revised: 11/17/2020 Document Reviewed: 08/11/2019 Elsevier  Patient Education  [place].

## 2022-05-24 NOTE — Progress Notes (Addendum)
[facility]    Provider:  Dr Jaynee Eagles Requesting Provider: Waunita Schooner, MD Primary Care Provider:  Eugenia Pancoast, FNP  CC:  daughter is concerned about patient asking same questions as well as family history of possibly dementia.  Pcp is now tabitha dugal Janett Billow cody MD), started visiting some communities. She has anxiety and depression. We discussed therapy. She may need to see a psychiatrist and a therapist. They are looking into [facility] and other facilities. We discussed I agree going in sooner rather than later. Discussed driving and getting a driving evaluation. Discussed diagnosis of dementia, living possibilities, driving and other, answered all questions, discussed medication and lecanemab and further evaluation.  Per Alphonzo Severance formal neurocognitive testing: "In your case, your test data do not look exactly like what I would expect in the setting of a classic presentation of Alzheimer's, because you are still retaining information over time. Nevertheless, given your age, the base rate of the condition, and your clinical history I still think that is the most likely cause of your problem. This could be further pursued with biomarkers.  Dr. Jaynee Eagles is (I believe) considering further workup as needed, such as a metabolic PET scan. This is a special type of scan that helps determine how your brain is functioning and can help to rule in/rule out things like Alzheimer's disease. There are additional blood based biomarkers (e.g., P-Tau in plasma) that could potentially also be considered. You can discuss further workup with Dr. Jaynee Eagles."  Patient complains of symptoms per HPI as well as the following symptoms: dementia . Pertinent negatives and positives per HPI. All others negative    HPI:  Shanisa Bonica is a 88 y.o. female here as requested by Waunita Schooner, MD for dementia? In the spring daughter started noticing her mother was repeating questions and daughter was  worried about. Mother had memory problems, grandmother had memory problems and dementia, undiagnosed. Daughter is here and provides most information. Grandmother had dementia starting about 17. Patient states her problems started 4 years ago with a move, she was "down" for a long time then covid came in on top of it, patient feels sad most days, daughter notices during the spring that patient had knee replacements and she had books to read by authors and typically patient is an avid reader and mother went by and patient did not feel like reading, patient would talk about loss of interest in hobbies, patient and husband live in an area and have struggled to connect since moving, now patient is going to silver sneakers. Patient reports her knees have been sore. Patient lives in a [place], husband pays the bills but has always done so, she drives and doesn't gets lost she drives locally, daughter asked her father and father has noticed she asks the same questionsin the same days, patient does not have hearing problems. She performs all her ADLs, IADLs, they both use smart phones well, they have a maid who cleans the house, husband and patient cook but use frozen food and go out to eat. No hallucinations or delusions. No changes in personality. No other focal neurologic deficits, associated symptoms, inciting events or modifiable factors.   Reviewed notes, labs and imaging from outside physicians, which showed:  10/2021: B12, rpr, tsh, cbc, cmp normal   MRI brain 11/2021: IMPRESSION: reviewed imaging (also with daughter and patient) and agree 1. No evidence of acute intracranial abnormality. 2. Mild chronic small vessel ischemic changes within the cerebral white matter. 3. Mild  generalized cerebral and cerebellar atrophy. 4. Paranasal sinus disease, as described.    Review of Systems: Patient complains of symptoms per HPI as well as the following symptoms repeating questions. Pertinent negatives and  positives per HPI. All others negative.   Social History   Socioeconomic History   Marital status: Married    Spouse name: [name]   Number of children: 1   Years of education: secretarial school   Highest education level: Not on file  Occupational History    Employer: OTHER  Tobacco Use   Smoking status: Never   Smokeless tobacco: Never  Vaping Use   Vaping Use: Never used  Substance and Sexual Activity   Alcohol use: Yes    Alcohol/week: 4.0 standard drinks of alcohol    Types: 4 Glasses of wine per week    Comment: 1/2 glass of wine nightly   Drug use: Never   Sexual activity: Yes    Partners: Male    Birth control/protection: Post-menopausal, Surgical    Comment: Hysterectomy  Other Topics Concern   Not on file  Social History Narrative   07/07/20   From: [place], [place] to be near family   Living: with husband [name] (1966)   Work: retired from music business - [employer]      Family: daughter Anderson [place] - no children      Enjoys: play tennis (though cannot play), reading, music      Exercise: walking, tries to do her knee therapy   Diet: generally      Safety   Seat belts: Yes    Guns: No   Safe in relationships: Yes    Social Determinants of Health   Financial Resource Strain: Low Risk  (01/20/2022)   Overall Financial Resource Strain (CARDIA)    Difficulty of Paying Living Expenses: Not hard at all  Food Insecurity: No Food Insecurity (01/20/2022)   Hunger Vital Sign    Worried About Running Out of Food in the Last Year: Never true    [place] in the Last Year: Never true  Transportation Needs: No Transportation Needs (01/20/2022)   PRAPARE - Hydrologist (Medical): No    Lack of Transportation (Non-Medical): No  Physical Activity: Sufficiently Active (01/20/2022)   Exercise Vital Sign    Days of Exercise per Week: 4 days    Minutes of Exercise per Session: 40 min  Stress: No Stress Concern  Present (01/20/2022)   [place]    Feeling of Stress : Only a little  Social Connections: Moderately Integrated (01/20/2022)   Social Connection and Isolation Panel [NHANES]    Frequency of Communication with Friends and Family: More than three times a week    Frequency of Social Gatherings with Friends and Family: Never    Attends Religious Services: More than 4 times per year    Active Member of [employer] or Organizations: No    Attends Archivist Meetings: Never    Marital Status: Married  Human resources officer Violence: Not At Risk (01/20/2022)   Humiliation, Afraid, Rape, and Kick questionnaire    Fear of Current or Ex-Partner: No    Emotionally Abused: No    Physically Abused: No    Sexually Abused: No    Family History  Problem Relation Age of Onset   Arthritis Mother    Asthma Mother    Hearing loss Mother    Osteoporosis  Mother    Heart disease Father    Hyperlipidemia Father    Kidney disease Father    Depression Brother    Hyperlipidemia Brother    Arrhythmia Brother        Psychologist, forensic   Dementia Maternal Grandmother    Breast cancer Neg Hx     Past Medical History:  Diagnosis Date   Arthritis    Frequent headaches    GERD (gastroesophageal reflux disease)    Hyperlipidemia    Pneumonia    PONV (postoperative nausea and vomiting)     Patient Active Problem List   Diagnosis Date Noted   Adjustment reaction with anxiety and depression 05/25/2022   FHx: memory loss 01/19/2022   Brain atrophy ([place]) 11/28/2021   Cerebral vascular disease 11/28/2021   Mild cognitive impairment 10/17/2021   Insomnia due to medical condition 10/17/2021   Weight loss 10/25/2020   Osteoporosis 07/07/2020   History of total right knee replacement 03/08/2020   Osteoarthritis of right knee 02/23/2020   Lumbar radiculopathy 09/27/2018   Degeneration of lumbar intervertebral disc 08/20/2018   Bilateral chronic  knee pain 06/06/2018   Hyperlipidemia    GERD (gastroesophageal reflux disease)    Frequent headaches    History of total hip arthroplasty 06/15/2015   Trochanteric bursitis of left hip 06/15/2015   Primary localized osteoarthritis of pelvic region and thigh 05/19/2013    Past Surgical History:  Procedure Laterality Date   ABDOMINAL HYSTERECTOMY     ANKLE SURGERY     EYE SURGERY     Left hip replacement Left 2013   right hip replacement Right 2000   TONSILLECTOMY AND ADENOIDECTOMY  1943   TOTAL KNEE ARTHROPLASTY Right 02/23/2020   Procedure: TOTAL KNEE ARTHROPLASTY;  Surgeon: Gaynelle Arabian, MD;  Location: [facility];  Service: Orthopedics;  Laterality: Right;  31mn   TOTAL KNEE ARTHROPLASTY Left 04/18/2021   Procedure: TOTAL KNEE ARTHROPLASTY;  Surgeon: AGaynelle Arabian MD;  Location: [facility];  Service: Orthopedics;  Laterality: Left;   WISDOM TOOTH EXTRACTION      Current Outpatient Medications  Medication Sig Dispense Refill   aspirin EC 81 MG tablet Take 1 tablet by mouth daily at 12 noon.     CALCIUM PO Take by mouth.     Cholecalciferol (VITAMIN D) 50 MCG (2000 UT) CAPS Take 2,000 Units by mouth daily.     donepezil (ARICEPT) 10 MG tablet Take 1 tablet (10 mg total) by mouth at bedtime. 90 tablet 3   memantine (NAMENDA) 10 MG tablet Start with one pill at bedtime for 2 weeks and if no side effects can increase to 195mtwice daily 180 tablet 4   Polyvinyl Alcohol-Povidone (REFRESH OP) Place 1 drop into both eyes 2 (two) times daily.     rosuvastatin (CRESTOR) 5 MG tablet Take 1 tablet (5 mg total) by mouth daily. 90 tablet 0   Turmeric 500 MG CAPS Take 500 mg by mouth daily.     mirtazapine (REMERON) 7.5 MG tablet Take 1 tablet (7.5 mg total) by mouth at bedtime. 30 tablet 1   No current facility-administered medications for this visit.    Allergies as of 05/24/2022 - Review Complete 05/24/2022  Allergen Reaction Noted   Penicillins Other (See Comments) 09/10/2017    Prednisone Other (See Comments) 02/16/2020    Vitals: BP 129/81   Pulse 87   Ht 5' 3"$  (1.6 m)   Wt 145 lb 6.4 oz (66 kg)   BMI 25.76 kg/m  Last Weight:  Wt Readings from Last 1 Encounters:  05/25/22 145 lb (65.8 kg)   Last Height:   Ht Readings from Last 1 Encounters:  05/25/22 5' 3"$  (1.6 m)     Physical exam: Exam: Gen: NAD, conversant, well nourised,  well groomed                     CV: RRR, no MRG. No Carotid Bruits. No peripheral edema, warm, nontender Eyes: Conjunctivae clear without exudates or hemorrhage  Neuro: Detailed Neurologic Exam  Speech:    Speech is normal; fluent and spontaneous with normal comprehension.  Cognition:     05/24/2022   11:18 AM 01/19/2022   11:44 AM 10/20/2019   10:24 AM  MMSE - Mini Mental State Exam  Orientation to time 4 5 5  $ Orientation to Place 3 4 5  $ Registration 3 3 3  $ Attention/ Calculation 1 3 5  $ Recall 3 2 3  $ Language- name 2 objects 2 2   Language- repeat 1 1 1  $ Language- follow 3 step command 3 3   Language- read & follow direction 1 1   Write a sentence 1 1   Copy design 0 0   Total score 22 25        The patient is oriented to person, place, and time;     recent and remote memory intact;     language fluent;     normal attention, concentration,     fund of knowledge Cranial Nerves:    The pupils are equal, round, and reactive to light. Attempted, pupils too small to visualize fundi. Visual fields are full to finger confrontation. Extraocular movements are intact. Trigeminal sensation is intact and the muscles of mastication are normal. The face is symmetric. The palate elevates in the midline. Hearing intact. Voice is normal. Shoulder shrug is normal. The tongue has normal motion without fasciculations.   Coordination:    Normal   Gait:    normal.   Motor Observation:    No asymmetry, no atrophy, and no involuntary movements noted. Tone:    Normal muscle tone.    Posture:    Posture is normal. normal  erect    Strength:    Strength is V/V in the upper and lower limbs.      Sensation: intact to LT     Reflex Exam:  DTR's:    Deep tendon reflexes in the upper and lower extremities are normal bilaterally.   Toes:    The toes are downgoing bilaterally.   Clonus:    Clonus is absent.    Assessment/Plan:  Patient diagnosed with dementia, likely alzheimers but slightly atypical  MRI of the brain is essentially normal for age, some atrophy, mild white matter changes Blood work was negative : check APOE4 and ATN today Formal memory testing Dr. Alphonzo Severance: dementia, likely alzheimers but cannot rule out other pathologies, recommended FDG PET Scan and I agree to differentiate between alzheimers and pseudodementia Consider Driving evaluation Consider Moving into an assisted living facility or independent facility Consider Therapy and psychiatry - Eino Farber at [facility] or talk to tabitha , think about starting a medication to help with mood Increase Aricept/Donepezil to 71m once a day for 2 weeks and if tolerated start Namenda Start Namenda/Memantine titrate to 184mtwice: Start with one pill at bedtime for 2 weeks and if no side effects can increase to 1053mwice daily  FDG PET Scan for dementia : declined. Will  order PET Amyloid which will help if we decide for lecanemab  Recommended to patient and daughter: "The XX Brain" Eden Emms  "MIND" diet [place] Healthy Brain Study  Discussed Dementia vs MCI vs nornal cognitive aging    Orders Placed This Encounter  Procedures   NM PET Brain Amyloid   APOE Alzheimer's Risk   ATN PROFILE   Meds ordered this encounter  Medications   donepezil (ARICEPT) 10 MG tablet    Sig: Take 1 tablet (10 mg total) by mouth at bedtime.    Dispense:  90 tablet    Refill:  3   memantine (NAMENDA) 10 MG tablet    Sig: Start with one pill at bedtime for 2 weeks and if no side effects can increase to 53m twice daily    Dispense:  180  tablet    Refill:  4    Cc: CWaunita Schooner MD,  DEugenia Pancoast FNP  ASarina Ill MD  [facility] [address] [address] Phone [phone] [phone] I spent over 60 minutes of face-to-face and non-face-to-face time with patient on the  1. Alzheimer disease ([place]   2. Alzheimer's disease, unspecified (CODE) ([place]   3. Short-term memory loss   4. Anxiety and depression   5. Cognitive and behavioral changes     diagnosis.  This included previsit chart review, lab review, study review, order entry, electronic health record documentation, patient education on the different diagnostic and therapeutic options, counseling and coordination of care, risks and benefits of management, compliance, or risk factor reduction

## 2022-05-25 ENCOUNTER — Ambulatory Visit: Payer: Medicare Other | Admitting: Family

## 2022-05-25 ENCOUNTER — Encounter: Payer: Self-pay | Admitting: Family

## 2022-05-25 VITALS — BP 129/81 | HR 87 | Temp 98.4°F | Ht 63.0 in | Wt 145.0 lb

## 2022-05-25 DIAGNOSIS — M170 Bilateral primary osteoarthritis of knee: Secondary | ICD-10-CM | POA: Diagnosis not present

## 2022-05-25 DIAGNOSIS — F320 Major depressive disorder, single episode, mild: Secondary | ICD-10-CM | POA: Diagnosis not present

## 2022-05-25 DIAGNOSIS — F4323 Adjustment disorder with mixed anxiety and depressed mood: Secondary | ICD-10-CM

## 2022-05-25 MED ORDER — MIRTAZAPINE 7.5 MG PO TABS: 7.5000 mg | ORAL_TABLET | Freq: Every day | ORAL | 1 refills | Status: DC

## 2022-05-25 NOTE — Assessment & Plan Note (Signed)
Start mirtazapine 7.5 mg once daily  F/u four weeks. We discussed common side effects such as nausea, drowsiness.  Also discussed rare but serious side effect of suicidal ideation.  She is instructed to discontinue medication and go directly to ED if this occurs.  Pt verbalizes understanding.  Plan is to follow up in 30 days to evaluate progress.

## 2022-05-25 NOTE — Progress Notes (Signed)
Established Patient Office Visit  Subjective:  Patient ID: Sherry Lamb, female    DOB: [birth year]  Age: 88 y.o. MRN: [id number]  CC:  Chief Complaint  Patient presents with   Medical Management of Chronic Issues    Wants to discuss meds for anxiety. Needs a referral to a psychologist.    HPI Sherry Lamb is here today for follow up.   Anxiety depression, always with problems sleeping, increased anxiety over health and often depressed due to new health situation. Saw neurologist woho also suggested possible medication with pcp to help with depression.   Mild dementia with anxiety     05/25/2022    9:34 AM 05/25/2022    9:28 AM 01/20/2022   10:31 AM  PHQ9 SCORE ONLY  PHQ-9 Total Score '14 14 4      '$ 05/25/2022    9:34 AM 05/25/2022    9:28 AM 10/17/2021   11:40 AM 04/21/2020   11:25 AM  GAD 7 : Generalized Anxiety Score  Nervous, Anxious, on Edge '2 2 3 '$ 0  Control/stop worrying '2 2 2 '$ 0  Worry too much - different things '2 2 2 '$ 0  Trouble relaxing '2 2 3 '$ 0  Restless 0 1 1 0  Easily annoyed or irritable '1 1 3 '$ 0  Afraid - awful might happen 0 0 2 0  Total GAD 7 Score '9 10 16 '$ 0  Anxiety Difficulty Not difficult at all Not difficult at all Not difficult at all Not difficult at all      Past Medical History:  Diagnosis Date   Arthritis    Frequent headaches    GERD (gastroesophageal reflux disease)    Hyperlipidemia    Pneumonia    PONV (postoperative nausea and vomiting)     Past Surgical History:  Procedure Laterality Date   ABDOMINAL HYSTERECTOMY     ANKLE SURGERY     EYE SURGERY     Left hip replacement Left 2013   right hip replacement Right 2000   TONSILLECTOMY AND ADENOIDECTOMY  1943   TOTAL KNEE ARTHROPLASTY Right 02/23/2020   Procedure: TOTAL KNEE ARTHROPLASTY;  Surgeon: Gaynelle Arabian, MD;  Location: [facility];  Service: Orthopedics;  Laterality: Right;  62mn   TOTAL KNEE ARTHROPLASTY Left 04/18/2021   Procedure: TOTAL KNEE ARTHROPLASTY;  Surgeon:  AGaynelle Arabian MD;  Location: [facility];  Service: Orthopedics;  Laterality: Left;   WISDOM TOOTH EXTRACTION      Family History  Problem Relation Age of Onset   Arthritis Mother    Asthma Mother    Hearing loss Mother    Osteoporosis Mother    Heart disease Father    Hyperlipidemia Father    Kidney disease Father    Depression Brother    Hyperlipidemia Brother    Arrhythmia Brother        pPsychologist, forensic  Dementia Maternal Grandmother    Breast cancer Neg Hx     Social History   Socioeconomic History   Marital status: Married    Spouse name: RHerbie [place]  Number of children: 1   Years of education: secretarial school   Highest education level: Not on file  Occupational History    Employer: OTHER  Tobacco Use   Smoking status: Never   Smokeless tobacco: Never  Vaping Use   Vaping Use: Never used  Substance and Sexual Activity   Alcohol use: Yes    Alcohol/week: 4.0 standard drinks of alcohol    Types: 4 Glasses of wine  per week   Drug use: Never   Sexual activity: Yes    Partners: Male    Birth control/protection: Post-menopausal, Surgical    Comment: Hysterectomy  Other Topics Concern   Not on file  Social History Narrative   07/07/20   From: [place], [place] to be near family   Living: with husband [name] (1966)   Work: retired from music business - [employer]      Family: daughter Anderson [place] - no children      Enjoys: play tennis (though cannot play), reading, music      Exercise: walking, tries to do her knee therapy   Diet: generally      Safety   Seat belts: Yes    Guns: No   Safe in relationships: Yes    Social Determinants of Health   Financial Resource Strain: Low Risk  (01/20/2022)   Overall Financial Resource Strain (CARDIA)    Difficulty of Paying Living Expenses: Not hard at all  Food Insecurity: No Food Insecurity (01/20/2022)   Hunger Vital Sign    Worried About Running Out of Food in the Last Year: Never true    [place] in the Last Year: Never true  Transportation Needs: No Transportation Needs (01/20/2022)   PRAPARE - Hydrologist (Medical): No    Lack of Transportation (Non-Medical): No  Physical Activity: Sufficiently Active (01/20/2022)   Exercise Vital Sign    Days of Exercise per Week: 4 days    Minutes of Exercise per Session: 40 min  Stress: No Stress Concern Present (01/20/2022)   [place]    Feeling of Stress : Only a little  Social Connections: Moderately Integrated (01/20/2022)   Social Connection and Isolation Panel [NHANES]    Frequency of Communication with Friends and Family: More than three times a week    Frequency of Social Gatherings with Friends and Family: Never    Attends Religious Services: More than 4 times per year    Active Member of [employer] or Organizations: No    Attends Archivist Meetings: Never    Marital Status: Married  Human resources officer Violence: Not At Risk (01/20/2022)   Humiliation, Afraid, Rape, and Kick questionnaire    Fear of Current or Ex-Partner: No    Emotionally Abused: No    Physically Abused: No    Sexually Abused: No    Outpatient Medications Prior to Visit  Medication Sig Dispense Refill   aspirin EC 81 MG tablet Take 1 tablet by mouth daily at 12 noon.     CALCIUM PO Take by mouth.     Cholecalciferol (VITAMIN D) 50 MCG (2000 UT) CAPS Take 2,000 Units by mouth daily.     donepezil (ARICEPT) 10 MG tablet Take 1 tablet (10 mg total) by mouth at bedtime. 90 tablet 3   memantine (NAMENDA) 10 MG tablet Start with one pill at bedtime for 2 weeks and if no side effects can increase to '10mg'$  twice daily 180 tablet 4   Polyvinyl Alcohol-Povidone (REFRESH OP) Place 1 drop into both eyes 2 (two) times daily.     rosuvastatin (CRESTOR) 5 MG tablet Take 1 tablet (5 mg total) by mouth daily. 90 tablet 0   Turmeric 500 MG CAPS Take 500 mg by mouth daily.      No facility-administered medications prior to visit.    Allergies  Allergen Reactions  Penicillins Other (See Comments)    Stomach tightness.  Tolerated Cephalosporin Date: 02/24/20. Tolerated Cephalosporin Date: 04/19/21.      Prednisone Other (See Comments)    GI intolerance      Review of Systems  ROS: Pertinent symptoms negative unless otherwise noted in HPI      Objective:    Physical Exam Vitals reviewed.  Constitutional:      Appearance: Normal appearance.  Eyes:     General:        Right eye: No discharge.        Left eye: No discharge.     Conjunctiva/sclera: Conjunctivae normal.  Cardiovascular:     Rate and Rhythm: Normal rate.  Pulmonary:     Effort: Pulmonary effort is normal. No respiratory distress.  Musculoskeletal:        General: Normal range of motion.     Cervical back: Normal range of motion.  Neurological:     General: No focal deficit present.     Mental Status: She is alert and oriented to person, place, and time. Mental status is at baseline.  Psychiatric:        Mood and Affect: Mood normal.        Behavior: Behavior normal.        Thought Content: Thought content normal.        Judgment: Judgment normal.      BP 129/81   Pulse 87   Temp 98.4 F (36.9 C) (Oral)   Ht '5\' 3"'$  (1.6 m)   Wt 145 lb (65.8 kg)   SpO2 98%   BMI 25.69 kg/m  Wt Readings from Last 3 Encounters:  05/25/22 145 lb (65.8 kg)  05/24/22 145 lb 6.4 oz (66 kg)  03/13/22 142 lb (64.4 kg)     Health Maintenance Due  Topic Date Due   DTaP/Tdap/Td (1 - Tdap) Never done    There are no preventive care reminders to display for this patient.  Lab Results  Component Value Date   TSH 1.40 10/17/2021   Lab Results  Component Value Date   WBC 5.1 10/17/2021   HGB 13.7 10/17/2021   HCT 41.4 10/17/2021   MCV 90.8 10/17/2021   PLT 200.0 10/17/2021   Lab Results  Component Value Date   NA 140 10/17/2021   K 4.3 10/17/2021   CO2 29 10/17/2021    GLUCOSE 94 10/17/2021   BUN 23 10/17/2021   CREATININE 0.59 10/17/2021   BILITOT 0.7 10/17/2021   ALKPHOS 67 10/17/2021   AST 24 10/17/2021   ALT 12 10/17/2021   PROT 7.0 10/17/2021   ALBUMIN 4.1 10/17/2021   CALCIUM 9.9 10/17/2021   ANIONGAP 5 04/19/2021   GFR 82.39 10/17/2021   Lab Results  Component Value Date   CHOL 198 01/23/2022   Lab Results  Component Value Date   HDL 81.80 01/23/2022   Lab Results  Component Value Date   LDLCALC 100 (H) 01/23/2022   Lab Results  Component Value Date   TRIG 78.0 01/23/2022   Lab Results  Component Value Date   CHOLHDL 2 01/23/2022   No results found for: "HGBA1C"    Assessment & Plan:   Problem List Items Addressed This Visit       Musculoskeletal and Integument   RESOLVED: Osteoarthritis of knee     Other   Adjustment reaction with anxiety and depression    Start mirtazapine 7.5 mg once daily  F/u four weeks. We discussed common side effects  such as nausea, drowsiness.  Also discussed rare but serious side effect of suicidal ideation.  She is instructed to discontinue medication and go directly to ED if this occurs.  Pt verbalizes understanding.  Plan is to follow up in 30 days to evaluate progress.         Relevant Medications   mirtazapine (REMERON) 7.5 MG tablet   RESOLVED: Depression, major, single episode, mild (HCC) - Primary   Relevant Medications   mirtazapine (REMERON) 7.5 MG tablet   Other Relevant Orders   Ambulatory referral to Psychology    Meds ordered this encounter  Medications   mirtazapine (REMERON) 7.5 MG tablet    Sig: Take 1 tablet (7.5 mg total) by mouth at bedtime.    Dispense:  30 tablet    Refill:  1    Order Specific Question:   Supervising Provider    Answer:   Diona Browner, AMY E [[id number]]    Follow-up: Return in about 1 month (around 06/25/2022) for f/u anxiety.    Eugenia Pancoast, FNP

## 2022-05-25 NOTE — Patient Instructions (Signed)
 ------------------------------------    Start mirtazapine 7.5 mg for anxiety and depression. Take 1/2 tablet by mouth once daily for about one week, then increase to 1 full tablet thereafter.   Taking the medicine as directed and not missing any doses is one of the best things you can do to treat your anxiety/depression.  Here are some things to keep in mind:  Side effects (stomach upset, some increased anxiety) may happen before you notice a benefit.  These side effects typically go away over time. Changes to your dose of medicine or a change in medication all together is sometimes necessary Many people will notice an improvement within two weeks but the full effect of the medication can take up to 4-6 weeks Stopping the medication when you start feeling better often results in a return of symptoms. Most people need to be on medication at least 6-12 months If you start having thoughts of hurting yourself or others after starting this medicine, please call me immediately.    ------------------------------------  A referral was placed today for psychology. Please let us know if you have not heard back within 2 weeks about the referral.   ------------------------------------  Regards,   Eugenia Pancoast FNP-C

## 2022-05-29 ENCOUNTER — Telehealth: Payer: Self-pay | Admitting: Neurology

## 2022-05-29 ENCOUNTER — Encounter: Payer: Self-pay | Admitting: Neurology

## 2022-05-29 NOTE — Telephone Encounter (Signed)
Pt is asking for a call to discuss her just not feeling right today.  Pt states her stomach is upset, she feels grumpy and feels it is likely because of mirtazapine (REMERON) 7.5 MG tablet , please call.

## 2022-05-29 NOTE — Telephone Encounter (Signed)
Pt is asking for a call back from [place], [place] her home# is [phone]

## 2022-05-29 NOTE — Telephone Encounter (Signed)
I called pt and they answered then hung up. I called and LMVM for her from her mobile that we have list area code [phone].  Please return call when you can.

## 2022-05-30 NOTE — Telephone Encounter (Signed)
Pt sent a detailed mychart message to Dr Jaynee Eagles.

## 2022-05-31 ENCOUNTER — Encounter: Payer: Self-pay | Admitting: Family

## 2022-06-01 ENCOUNTER — Encounter: Payer: Self-pay | Admitting: Family

## 2022-06-02 LAB — APOE ALZHEIMER'S RISK

## 2022-06-02 LAB — ATN PROFILE
A -- Beta-amyloid 42/40 Ratio: 0.09 — ABNORMAL LOW (ref >0.102)
Beta-amyloid 40: 195.12 pg/mL
Beta-amyloid 42: 17.64 pg/mL
N -- NfL, Plasma: 3.82 pg/mL (ref 0.00–11.55)
T -- p-tau181: 1.39 pg/mL — ABNORMAL HIGH (ref 0.00–0.97)

## 2022-06-05 ENCOUNTER — Telehealth: Payer: Self-pay | Admitting: Neurology

## 2022-06-05 NOTE — Telephone Encounter (Signed)
Pod 4: Please discuss with patient or family. She does not have an alzheimer's gene however the bloodword results are consistent with the presence of Alzheimer's related pathology. We can see what the PET Scan shows fr confirmation. Cc Sherry Lamb see phone note

## 2022-06-06 NOTE — Telephone Encounter (Signed)
Noted! Thank you

## 2022-06-06 NOTE — Telephone Encounter (Signed)
Called main # on chart (daughter) and LVM with office number, asking for call back.

## 2022-06-07 NOTE — Telephone Encounter (Signed)
I called and spoke to daughter of pt, [place] on [place].  She did see the results of testing of labs in [place].  I relayed Dr. Cathren Laine results.  She verbalized understanding.  Will move forward with PET scan, looks like still pending per [facility].  They are to get call from facility when authorized.  She appreciated call back.

## 2022-06-08 ENCOUNTER — Encounter: Payer: Medicare Other | Admitting: Nurse Practitioner

## 2022-06-12 ENCOUNTER — Telehealth: Payer: Self-pay | Admitting: Neurology

## 2022-06-12 ENCOUNTER — Encounter: Payer: Self-pay | Admitting: Podiatry

## 2022-06-12 ENCOUNTER — Ambulatory Visit: Payer: Medicare Other | Admitting: Podiatry

## 2022-06-12 VITALS — BP 147/83 | HR 75

## 2022-06-12 DIAGNOSIS — B351 Tinea unguium: Secondary | ICD-10-CM | POA: Diagnosis not present

## 2022-06-12 DIAGNOSIS — M79675 Pain in left toe(s): Secondary | ICD-10-CM | POA: Diagnosis not present

## 2022-06-12 DIAGNOSIS — M79674 Pain in right toe(s): Secondary | ICD-10-CM | POA: Diagnosis not present

## 2022-06-12 NOTE — Telephone Encounter (Signed)
[employer] medicare is requiring a peer to peer for the pet metabolic. Please call [phone] option 3, reference #[id number] before 2/10.

## 2022-06-13 ENCOUNTER — Encounter: Payer: Self-pay | Admitting: Podiatry

## 2022-06-13 NOTE — Progress Notes (Signed)
  Subjective:  Patient ID: Sherry Lamb, female    DOB: 29-Dec-1934,  MRN: [id number]  Chief Complaint  Patient presents with   Nail Problem    "Clip the toenails.  I have quite a bit of fungus."    88 y.o. female presents with the above complaint. History confirmed with patient.  Her nails are thickened and elongated and painful.  Debridement has been helpful.  She reports no other new issues  Objective:  Physical Exam: warm, good capillary refill, no trophic changes or ulcerative lesions, normal DP and PT pulses and normal sensory exam.  She has thickened and elongated yellow toenails x10 with subungual debris  Assessment:   1. Pain due to onychomycosis of toenails of both feet      Plan:  Patient was evaluated and treated and all questions answered.   Discussed the etiology and treatment options for the condition in detail with the patient. Educated patient on the topical and oral treatment options for mycotic nails. Recommended debridement of the nails today. Sharp and mechanical debridement performed of all painful and mycotic nails today. Nails debrided in length and thickness using a nail nipper to level of comfort. Discussed treatment options including appropriate shoe gear. Follow up as needed for painful nails.    Return in about 12 weeks (around 09/04/2022) for painful thick fungal nails.

## 2022-06-16 ENCOUNTER — Other Ambulatory Visit: Payer: Self-pay | Admitting: Family

## 2022-06-16 DIAGNOSIS — F4323 Adjustment disorder with mixed anxiety and depressed mood: Secondary | ICD-10-CM

## 2022-06-16 DIAGNOSIS — F320 Major depressive disorder, single episode, mild: Secondary | ICD-10-CM

## 2022-06-19 NOTE — Addendum Note (Signed)
Addended by: Sarina Ill B on: 06/19/2022 03:23 PM   Modules accepted: Orders

## 2022-06-19 NOTE — Telephone Encounter (Signed)
Declined, family aware if that happened I would order an Amyloid pet scan and sandy is telling them thanks

## 2022-06-20 NOTE — Telephone Encounter (Signed)
Pet amyloid [employer] medicare Josem [id number] exp. 06/20/22-08/04/22 sent to [place]

## 2022-06-26 ENCOUNTER — Ambulatory Visit: Payer: Medicare Other | Admitting: Podiatry

## 2022-06-26 ENCOUNTER — Encounter: Payer: Self-pay | Admitting: Family

## 2022-06-26 ENCOUNTER — Ambulatory Visit: Payer: Medicare Other | Admitting: Family

## 2022-06-26 VITALS — BP 127/82 | HR 88 | Temp 97.7°F | Ht 63.0 in | Wt 144.6 lb

## 2022-06-26 DIAGNOSIS — E785 Hyperlipidemia, unspecified: Secondary | ICD-10-CM | POA: Diagnosis not present

## 2022-06-26 DIAGNOSIS — I679 Cerebrovascular disease, unspecified: Secondary | ICD-10-CM

## 2022-06-26 DIAGNOSIS — G3184 Mild cognitive impairment, so stated: Secondary | ICD-10-CM

## 2022-06-26 DIAGNOSIS — F4323 Adjustment disorder with mixed anxiety and depressed mood: Secondary | ICD-10-CM

## 2022-06-26 NOTE — Progress Notes (Signed)
Established Patient Office Visit  Subjective:      CC:  Chief Complaint  Patient presents with   Medical Management of Chronic Issues    HPI: Sherry Lamb is a 88 y.o. female presenting on 06/26/2022 for Medical Management of Chronic Issues . Anxiety/depression: has appt with therapist next week. Was started on mirtazapine for two days however didn't make her feel well so they stopped and symptoms stopped. She states today anxiety is improving a bit, husband agrees.   Mild cognitive impairment, tolerating slightly nausea and stomach upset with memantine 10 mg once daily, so has not increased to twice daily. She has decided due to s/e will keep at once daily. Has f/u with Dr. Jaynee Eagles in July 2024.   Wt Readings from Last 3 Encounters:  06/26/22 144 lb 9.6 oz (65.6 kg)  05/25/22 145 lb (65.8 kg)  05/24/22 145 lb 6.4 oz (66 kg)     New complaints: Denies acute concerns.      Social history:  Relevant past medical, surgical, family and social history reviewed and updated as indicated. Interim medical history since our last visit reviewed.  Allergies and medications reviewed and updated.  DATA REVIEWED: CHART IN EPIC     ROS: Negative unless specifically indicated above in HPI.    Current Outpatient Medications:    aspirin EC 81 MG tablet, Take 1 tablet by mouth daily at 12 noon., Disp: , Rfl:    CALCIUM PO, Take by mouth., Disp: , Rfl:    Cholecalciferol (VITAMIN D) 50 MCG (2000 UT) CAPS, Take 2,000 Units by mouth daily., Disp: , Rfl:    donepezil (ARICEPT) 10 MG tablet, Take 1 tablet (10 mg total) by mouth at bedtime., Disp: 90 tablet, Rfl: 3   memantine (NAMENDA) 10 MG tablet, Start with one pill at bedtime for 2 weeks and if no side effects can increase to 93m twice daily, Disp: 180 tablet, Rfl: 4   Polyvinyl Alcohol-Povidone (REFRESH OP), Place 1 drop into both eyes 2 (two) times daily., Disp: , Rfl:    rosuvastatin (CRESTOR) 5 MG tablet, Take 1 tablet (5 mg  total) by mouth daily. (Patient taking differently: Take 5 mg by mouth daily. 2-3 times per week), Disp: 90 tablet, Rfl: 0   Turmeric 500 MG CAPS, Take 500 mg by mouth daily., Disp: , Rfl:       Objective:    BP 127/82   Pulse 88   Temp 97.7 F (36.5 C) (Temporal)   Ht 5' 3"$  (1.6 m)   Wt 144 lb 9.6 oz (65.6 kg)   SpO2 98%   BMI 25.61 kg/m   Wt Readings from Last 3 Encounters:  06/26/22 144 lb 9.6 oz (65.6 kg)  05/25/22 145 lb (65.8 kg)  05/24/22 145 lb 6.4 oz (66 kg)    Physical Exam Constitutional:      General: She is not in acute distress.    Appearance: Normal appearance. She is normal weight. She is not ill-appearing, toxic-appearing or diaphoretic.  HENT:     Head: Normocephalic.  Cardiovascular:     Rate and Rhythm: Normal rate and regular rhythm.  Pulmonary:     Effort: Pulmonary effort is normal.  Musculoskeletal:        General: Normal range of motion.     Right lower leg: No edema.     Left lower leg: No edema.  Neurological:     General: No focal deficit present.     Mental Status: She is  alert and oriented to person, place, and time. Mental status is at baseline.  Psychiatric:        Mood and Affect: Mood normal.        Behavior: Behavior normal.        Thought Content: Thought content normal.        Judgment: Judgment normal.           Assessment & Plan:  Cerebral vascular disease Assessment & Plan: Continue daily asa 81 mg    Adjustment reaction with anxiety and depression Assessment & Plan: Improving  Continue f/u consult with therapist scheduled    Hyperlipidemia, unspecified hyperlipidemia type Assessment & Plan: Continue rosuvastatin 5 mg nightly  Tolerating well.  Work on low cholesterol diet exercise as tolerated   Mild cognitive impairment Assessment & Plan: Continue medications as prescribed and f/u neuro as scheduled.       Return in about 6 months (around 12/25/2022) for f/u CPE/AWE after 01/21/23.  Eugenia Pancoast,  MSN, APRN, FNP-C [place]

## 2022-06-26 NOTE — Patient Instructions (Addendum)
  Start daily 500 mcg b12 over the counter.   You will need to come fasting to your next appointment which means only water and or black coffee morning of.   Regards,   Eugenia Pancoast FNP-C

## 2022-06-26 NOTE — Assessment & Plan Note (Signed)
Improving  Continue f/u consult with therapist scheduled

## 2022-06-26 NOTE — Assessment & Plan Note (Signed)
Continue rosuvastatin 5 mg nightly  Tolerating well.  Work on low cholesterol diet exercise as tolerated

## 2022-06-26 NOTE — Telephone Encounter (Signed)
The patient called and said she doesn't know why she needs a pet scan done and would like someone to call her to explain it to her. She said she got the approval in the mail from her insurance and didn't know anything about it. I made her number the first one to call in her chart. It doesn't look like the hospital has called her to schedule it yet. The phone number is [phone]

## 2022-06-26 NOTE — Assessment & Plan Note (Signed)
Continue medications as prescribed and f/u neuro as scheduled.

## 2022-06-26 NOTE — Assessment & Plan Note (Signed)
Continue daily asa 81 mg

## 2022-06-27 NOTE — Telephone Encounter (Signed)
I called pt and LMVM for her that would be glad to speak to her about the letter and reason for this test.  I did speak to [facility], daughter and gave her the # to call and get her scheduled for the PET SCAN.  Exp. 06/20/22-08/04/22 sent to [place]. She said she will do that as soon as she can.  Appreciated call back.

## 2022-06-27 NOTE — Telephone Encounter (Signed)
LVM for patient to call back to discuss letter about PET Scan

## 2022-06-27 NOTE — Telephone Encounter (Signed)
Pt called wanting to speak to the RN or MD regarding a letter she received from the insurance regarding her PET Scan. Please advise.

## 2022-06-28 ENCOUNTER — Ambulatory Visit (INDEPENDENT_AMBULATORY_CARE_PROVIDER_SITE_OTHER): Payer: Medicare Other | Admitting: Clinical

## 2022-06-28 DIAGNOSIS — F4320 Adjustment disorder, unspecified: Secondary | ICD-10-CM | POA: Diagnosis not present

## 2022-06-28 NOTE — Telephone Encounter (Signed)
Pt called back. I gave her the information about Pet Scan and the # listed to call and schedule.  It is test for amyloid plaques in brain looking for ALZ Dementia.  She verbalized understanding.  I gave her # that was given to me. [phone].  She will call back as needed.

## 2022-06-28 NOTE — Progress Notes (Unsigned)
[place] Counselor Initial Adult Exam  Name: Sherry Lamb Date: 06/28/2022 MRN: [id number] DOB: [birth year] PCP: Eugenia Pancoast, FNP  Time spent: 10:34am - 11:37am   Guardian/Payee:  NA    Paperwork requested:  NA  Reason for Visit /Presenting Problem: Patient reported she was referred by her primary care provider due to changes in memory since June 2023.   Mental Status Exam: Appearance:   Well Groomed     Behavior:  Appropriate  Motor:  Normal  Speech/Language:   Clear and Coherent  Affect:  Appropriate  Mood:  normal   Thought process:  normal  Thought content:    WNL  Sensory/Perceptual disturbances:    WNL  Orientation:  oriented to person, place, situation, and day of week  Attention:  Good  Concentration:  Good  Memory:  Husband reported patient has become forgetful and repeats herself  Fund of knowledge:   Good  Insight:    Fair  Judgment:   Good  Impulse Control:  Good    Reported Symptoms:  Patient stated, "I thought I was ok". Patient reported she is not aware of any cognitive changes. Patient's husband reported patient is becoming forgetful. Patient reported she has been told she repeats herself and stated, "I just want to be considered normal again". Patient reported recent improvement in sleep and reported she was previously experiencing difficulty staying asleep. Patient's husband reported recent decrease in patient's food intake. Patient reported "neutral" mood. Patient stated, "I would like to be me again" in response to medical conditions. Patient reported loss of interest in reading. Patient's husband reported patient has "backed off" of participating in activities and socializing with others in their community due to concern that she will forget how to play mahjong. Patient reported she did not want to move from [place] to [place] and reported she feels alone in [place].    Risk Assessment: Danger to Self:  No patient  denied current and past suicidal ideation and symptoms of psychosis Self-injurious Behavior: No Danger to Others: No patient denied current and past homicidal ideation Duty to Warn:no Physical Aggression / Violence:No  Access to Firearms a concern: No  Gang Involvement:No  Patient / guardian was educated about steps to take if suicide or homicide risk level increases between visits: yes While future psychiatric events cannot be accurately predicted, the patient does not currently require acute inpatient psychiatric care and does not currently meet [facility] involuntary commitment criteria.  Substance Abuse History: Current substance abuse: Yes   patient reported she currently drinks half a glass of wine with dinner, with last use last night. Patient reported no current or past tobacco or drug use.   Past Psychiatric History:   No previous psychological problems have been observed Outpatient Providers: none History of Psych Hospitalization: No  Psychological Testing:  neuropsychological testing with Alphonzo Severance at [place] 2 months ago     Abuse History:  Victim of: No.,  none    Report needed: No. Victim of Neglect:No. Perpetrator of  none reported   Witness / Exposure to Domestic Violence: No   Protective Services Involvement: No  Witness to [employer] Violence:  No   Family History:  Family History  Problem Relation Age of Onset   Arthritis Mother    Asthma Mother    Hearing loss Mother    Osteoporosis Mother    Heart disease Father    Hyperlipidemia Father    Kidney disease Father    Depression Brother  Hyperlipidemia Brother    Arrhythmia Brother        Psychologist, forensic   Dementia Maternal Grandmother    Breast cancer Neg Hx     Living situation: the patient lives with their spouse  Sexual Orientation: Straight  Relationship Status: married  Name of spouse / other: [name] If a parent, number of children / ages: 35 daughter 10630 year old)  Support Systems:  spouse friends daughter  Museum/gallery curator Stress:  No    Income/Employment/Disability: Actor: No   Educational History: Education:  secretarial school  Religion/Sprituality/World View: Roman Catholic  Any cultural differences that may affect / interfere with treatment:  not applicable   Recreation/Hobbies: play mahjong on ipad, cooking  Stressors: Other: move from [facility] to [place], "I feel like she's (daughter) directing my life",   patient stated, "coming here I felt like I gave up my whole life".   Strengths: Spirituality, talking to friend and spouse  Barriers:  none reported    Legal History: Pending legal issue / charges: The patient has no significant history of legal issues. History of legal issue / charges:  none reported  Medical History/Surgical History: reviewed Past Medical History:  Diagnosis Date   Arthritis    Frequent headaches    GERD (gastroesophageal reflux disease)    Hyperlipidemia    Pneumonia    PONV (postoperative nausea and vomiting)     Past Surgical History:  Procedure Laterality Date   ABDOMINAL HYSTERECTOMY     ANKLE SURGERY     EYE SURGERY     Left hip replacement Left 2013   right hip replacement Right 2000   TONSILLECTOMY AND ADENOIDECTOMY  1943   TOTAL KNEE ARTHROPLASTY Right 02/23/2020   Procedure: TOTAL KNEE ARTHROPLASTY;  Surgeon: Gaynelle Arabian, MD;  Location: [facility];  Service: Orthopedics;  Laterality: Right;  16mn   TOTAL KNEE ARTHROPLASTY Left 04/18/2021   Procedure: TOTAL KNEE ARTHROPLASTY;  Surgeon: AGaynelle Arabian MD;  Location: [facility];  Service: Orthopedics;  Laterality: Left;   WISDOM TOOTH EXTRACTION      Medications: Current Outpatient Medications  Medication Sig Dispense Refill   aspirin EC 81 MG tablet Take 1 tablet by mouth daily at 12 noon.     CALCIUM PO Take by mouth.     Cholecalciferol (VITAMIN D) 50 MCG (2000 UT) CAPS Take 2,000 Units by mouth daily.     donepezil (ARICEPT)  10 MG tablet Take 1 tablet (10 mg total) by mouth at bedtime. 90 tablet 3   memantine (NAMENDA) 10 MG tablet Start with one pill at bedtime for 2 weeks and if no side effects can increase to 150mtwice daily 180 tablet 4   Polyvinyl Alcohol-Povidone (REFRESH OP) Place 1 drop into both eyes 2 (two) times daily.     rosuvastatin (CRESTOR) 5 MG tablet Take 1 tablet (5 mg total) by mouth daily. (Patient taking differently: Take 5 mg by mouth daily. 2-3 times per week) 90 tablet 0   Turmeric 500 MG CAPS Take 500 mg by mouth daily.     No current facility-administered medications for this visit.  06/28/22 Per patient's husband patient is no longer taking aricept 06/28/22 per patient's husband patient is currently taking a multi vitamin  Allergies  Allergen Reactions   Penicillins Other (See Comments)    Stomach tightness.  Tolerated Cephalosporin Date: 02/24/20. Tolerated Cephalosporin Date: 04/19/21.      Mirtazapine Other (See Comments)    Didn't feel well  Prednisone Other (See Comments)    GI intolerance    Diagnoses:  Adjustment disorder, unspecified type  Plan of Care: Patient is an 88 year old female who presented for an initial assessment. Patient was accompanied to today's appointment by her husband, [name], and provided verbal consent for her husband to be present during the assessment. Clinician conducted initial assessment in person from clinician's office at [facility]. Patient reported she was referred by her primary care provider due to changes in memory since June 2023. Patient's husband reported patient is becoming forgetful and there has been a recent decrease in patient's food intake. Patient reported she has been told she repeats herself and stated, "I just want to be considered normal again". Patient reported recent improvement in sleep and reported she was previously experiencing difficulty staying asleep. Patient reported loss of interest in reading. Patient's  husband reported patient has "backed off" of participating in activities and socializing with others in their community due to concern that she will forget how to play mahjong. Patient reported she did not want to move from [place] to [place] and reported she feels alone in [place].  Patient denied current and past suicidal ideation, homicidal ideation, and symptoms of psychosis. Patient reported she currently drinks half a glass of wine with dinner. Patient reported no current or past tobacco or drug use. Patient reported no history of outpatient or inpatient psychiatric treatment. It was reported patient completed neuropsychological testing two months ago and it was reported patient was diagnosed with mild Alzheimer's Disease. Patient reported the move to [place] and the relationship with her daughter are current stressors.  Patient identified her husband, her daughter, and friends as current supports. recommended patient participate in individual therapy. Clinician will review recommendations and treatment plan with patient during follow up appointment.   Katherina Right, LCSW

## 2022-06-28 NOTE — Progress Notes (Unsigned)
                Shenouda Genova, LCSW 

## 2022-06-28 NOTE — Telephone Encounter (Signed)
LMVM for pt that we are returning call about letter she received of a test ordered by Dr. Jaynee Eagles.  If she has any questions to let us know.  (2nd call).

## 2022-06-30 ENCOUNTER — Telehealth: Payer: Self-pay | Admitting: Family

## 2022-06-30 NOTE — Telephone Encounter (Signed)
Patients husband came by and dropped off some information that was requested from them. Placed in your folder up front.

## 2022-07-01 ENCOUNTER — Other Ambulatory Visit: Payer: Self-pay | Admitting: Family

## 2022-07-07 ENCOUNTER — Encounter: Payer: Medicare Other | Admitting: Family

## 2022-07-13 ENCOUNTER — Other Ambulatory Visit (HOSPITAL_COMMUNITY): Payer: Medicare Other

## 2022-07-20 ENCOUNTER — Encounter (HOSPITAL_COMMUNITY)
Admission: RE | Admit: 2022-07-20 | Discharge: 2022-07-20 | Disposition: A | Discharge status: Home / Self Care | Payer: Medicare Other | Source: Ambulatory Visit | Attending: Neurology | Admitting: Neurology

## 2022-07-20 DIAGNOSIS — G309 Alzheimer's disease, unspecified: Secondary | ICD-10-CM | POA: Diagnosis present

## 2022-07-20 DIAGNOSIS — R4189 Other symptoms and signs involving cognitive functions and awareness: Secondary | ICD-10-CM | POA: Insufficient documentation

## 2022-07-20 DIAGNOSIS — R413 Other amnesia: Secondary | ICD-10-CM | POA: Insufficient documentation

## 2022-07-20 DIAGNOSIS — F0280 Dementia in other diseases classified elsewhere without behavioral disturbance: Secondary | ICD-10-CM | POA: Insufficient documentation

## 2022-07-20 DIAGNOSIS — R4689 Other symptoms and signs involving appearance and behavior: Secondary | ICD-10-CM | POA: Insufficient documentation

## 2022-07-20 MED ORDER — FLORBETAPIR F 18 500-1900 MBQ/ML IV SOLN
9.1000 | Freq: Once | INTRAVENOUS | Status: AC
  Administered 2022-07-20: 9.1 via INTRAVENOUS

## 2022-07-26 ENCOUNTER — Telehealth: Payer: Self-pay | Admitting: Family

## 2022-07-26 ENCOUNTER — Ambulatory Visit: Payer: Medicare Other | Admitting: Clinical

## 2022-07-26 DIAGNOSIS — Z0279 Encounter for issue of other medical certificate: Secondary | ICD-10-CM

## 2022-07-26 DIAGNOSIS — F4320 Adjustment disorder, unspecified: Secondary | ICD-10-CM | POA: Diagnosis not present

## 2022-07-26 NOTE — Progress Notes (Signed)
[facility] Counselor/Therapist Progress Note  Patient ID: Sherry Lamb, MRN: [id number],    Date: 07/26/2022  Time Spent: 10:31am - 11:26am : 55 minutes   Treatment Type: Individual Therapy  Reported Symptoms: Patient reported fluctuations in mood and worry.   Mental Status Exam: Appearance:  Well Groomed     Behavior: Appropriate  Motor: Normal  Speech/Language:  Clear and Coherent  Affect: Appropriate  Mood: Patient reported feeling tired  Thought process: normal  Thought content:   WNL  Sensory/Perceptual disturbances:   WNL  Orientation: oriented to person and situation  Attention: Good  Concentration: Good  Memory: It was reported patient has experienced changes in short term memory  Fund of knowledge:  Good  Insight:   Fair  Judgment:  Good  Impulse Control: Good   Risk Assessment: Danger to Self:  No Patient denied current suicidal ideation  Self-injurious Behavior: No Danger to Others: No Patient denied current homicidal ideation  Duty to Warn:no Physical Aggression / Violence:No  Access to Firearms a concern: No  Gang Involvement:No   Subjective: Patient's husband reported patient has had a PET scan since last session. It was reported during previous conversations with patient's medical providers, patient/husband were advised patient has a diagnoses of Alzheimer's Disease. Patient's  husband reported patient is currently taking Namenda for treatment of Alzheimer's Disease. Patient stated, "its scary" in response to the diagnosis. Patient stated, "I feel like I can't trust myself". Patient stated, "I don't realize anything" in response to clinician's inquiry about changes in memory. Patient reported she's worried about driving and is fearful to drive. Patient stated, "I go through a lot of moods". Patient stated, "I wish I didn't have that" in response to a diagnosis of Alzheimer's Disease. Patient inquired about Alzheimer's Disease. Patient stated, "Im  tired today". Patient reported she is open to participation in therapy. Patient stated, "I have cried so many days since I left" in regards to patient's move to [place].    Interventions: Clinician conducted session in person at clinician's office at [facility]. Patient requested husband be present at the beginning of session and provided verbal consent for husband to be present during session. Provided psycho education related to Alzheimer's Disease and other Dementias. Patient inquired about medications utilized to treat Alzheimer's Disease and clinician provided psycho education related to medications utilized to treat a diagnosis of Alzheimer's Disease. Provided active and reflective listening and validation. Clinician reviewed diagnosis and treatment recommendations. Provided psycho education related to diagnosis and treatment  Diagnosis:No diagnosis found.  Plan: Goals to be determined during follow up appointment on 08/22/22.   Katherina Right, LCSW

## 2022-07-26 NOTE — Progress Notes (Signed)
                Kerrianne Jeng, LCSW 

## 2022-07-26 NOTE — Telephone Encounter (Signed)
Patient dropped off document Handicap Placard, to be filled out by provider. Patient requested to send it via Call Patient to pick up within ASAP. Document is located in providers tray at front office.Please advise at [phone].

## 2022-07-26 NOTE — Telephone Encounter (Signed)
Form received and placed into Sherry Lamb's box in her office.

## 2022-07-27 NOTE — Telephone Encounter (Signed)
Completed and in outbox. 

## 2022-07-28 NOTE — Telephone Encounter (Signed)
Pt aware that form is ready for pick up.

## 2022-08-22 ENCOUNTER — Ambulatory Visit: Payer: Medicare Other | Admitting: Clinical

## 2022-08-22 DIAGNOSIS — F4322 Adjustment disorder with anxiety: Secondary | ICD-10-CM | POA: Diagnosis not present

## 2022-08-22 NOTE — Progress Notes (Signed)
[facility] Counselor/Therapist Progress Note  Patient ID: Trent Theisen, MRN: [id number]    Date: 08/22/22  Time Spent: 12:35  pm - 1:23 pm : 48 Minutes  Treatment Type: Individual Therapy.  Reported Symptoms: Patient reported "up and down" in response to mood since last session and reported anxiety.   Mental Status Exam: Appearance:  Well Groomed     Behavior: Appropriate  Motor: Normal  Speech/Language:  Clear and Coherent  Affect: Appropriate  Mood: normal  Thought process: normal  Thought content:   WNL  Sensory/Perceptual disturbances:   WNL  Orientation: oriented to person, place, and situation  Attention: Good  Concentration: Good  Memory: Recent;   Fair  Progress Energy of knowledge:  Good  Insight:   Fair  Judgment:  Good  Impulse Control: Good   Risk Assessment: Danger to Self:  No Patient denied current suicidal ideation  Self-injurious Behavior: No Danger to Others: No Patient denied current homicidal ideation  Duty to Warn:no Physical Aggression / Violence:No  Access to Firearms a concern: No  Gang Involvement:No   Subjective:  Patient reported her friend is in the hospital and she is concerned about her friend. Patient stated, "I get emotional" in response to events, such as, recent news regarding her friend.  Patient stated, "up and down" in response to mood since last session. Patient stated, "I try to be cheerful", "I try to dwell on the better things". Patient reported her husband was diagnosed with asthma and she is concerned about his health. Patient stated, "ok" in response to patient's mood today. Patient stated,  "this is new to me, I don't know" in response to goals for therapy. Patient stated, "anxiety, getting upset to no end". Patient stated, "I need patience", "I run out of patience easily". Patient stated, "I internalize". Patient reported she feels her daughter has shut her out of parts of her life since her daughter was a teenager. Patient  stated, "I never had a good opinion of myself". Patient agreed to goals/treatment plan.  Interventions: Motivational Interviewing. Clinician conducted session in person at clinician's office at [facility]. Reviewed events since last session. Assessed mood since last session and current mood. Clinician utilized motivational interviewing to explore potential goals for therapy. Clinician utilized a task centered approach in collaboration with patient to develop goals for therapy.   Collaboration of Care: Other not discussed during today's session  Diagnosis:  Adjustment disorder with anxious mood   Plan: Patient is to utilize Cognitive Behavioral Therapy, thought re-framing, positive self talk, and coping strategies to decrease symptoms associated with their diagnosis. Frequency: bi-weekly  Modality: individual     Long-term goal:   Reduce overall level, frequency, and intensity of the feelings of anxiety as evidenced by decrease in anxiety, worry, crying, and feeling uneasy from 3 to 4 days/week to 0 to 1 days/week per client report for at least 3 consecutive months. Target Date: 08/22/23  Progress: 0   Short-term goal:  Develop and implement effective and healthy communication strategies for patient to utilize to vocalize her thoughts and feelings to others in a controlled and assertive way  Target Date: 02/21/23  Progress: 0   Identify negative self talk and negative thoughts used to reinforce low self esteem and replace with positive self talk and positive beliefs Target Date: 02/21/23  Progress: 0                      Doree Barthel, LCSW

## 2022-09-13 ENCOUNTER — Ambulatory Visit: Payer: Medicare Other | Admitting: Podiatry

## 2022-09-18 ENCOUNTER — Ambulatory Visit: Payer: Medicare Other | Admitting: Podiatry

## 2022-09-18 DIAGNOSIS — B351 Tinea unguium: Secondary | ICD-10-CM

## 2022-09-18 DIAGNOSIS — M79674 Pain in right toe(s): Secondary | ICD-10-CM

## 2022-09-18 DIAGNOSIS — M79675 Pain in left toe(s): Secondary | ICD-10-CM

## 2022-09-18 NOTE — Progress Notes (Signed)
  Subjective:  Patient ID: Sherry Lamb, female    DOB: [birth year],  MRN: [id number]  Chief Complaint  Patient presents with   Nail Problem    Thick painful toenails, 3 month follow up    88 y.o. female presents with the above complaint. History confirmed with patient.  Her nails are thickened and elongated and painful.  Debridement has been helpful.  She reports no other new issues  Objective:  Physical Exam: warm, good capillary refill, no trophic changes or ulcerative lesions, normal DP and PT pulses and normal sensory exam.  She has thickened and elongated yellow toenails x10 with subungual debris  Assessment:   1. Pain due to onychomycosis of toenails of both feet      Plan:  Patient was evaluated and treated and all questions answered.   Discussed the etiology and treatment options for the condition in detail with the patient.  Regular intermittent debridement has been helpful in reducing pain and improving function. Recommended debridement of the nails today. Sharp and mechanical debridement performed of all painful and mycotic nails today. Nails debrided in length and thickness using a nail nipper to level of comfort. Discussed treatment options including appropriate shoe gear. Follow up as needed for painful nails.    Return in about 10 weeks (around 11/27/2022) for painful thick fungal nails.

## 2022-09-19 ENCOUNTER — Ambulatory Visit: Payer: Medicare Other | Admitting: Clinical

## 2022-09-19 DIAGNOSIS — F4322 Adjustment disorder with anxiety: Secondary | ICD-10-CM

## 2022-09-19 NOTE — Progress Notes (Signed)
[facility] Counselor/Therapist Progress Note  Patient ID: Sherry Lamb, MRN: [id number],    Date: 09/19/2022  Time Spent: 10:35am - 11:22am : 47 minutes   Treatment Type: Individual Therapy  Reported Symptoms: Patient reported crying at times.  Mental Status Exam: Appearance:  Well Groomed     Behavior: Appropriate  Motor: Normal  Speech/Language:  Clear and Coherent  Affect: Appropriate  Mood: normal  Thought process: normal  Thought content:   WNL  Sensory/Perceptual disturbances:   WNL  Orientation: oriented to person, place, and situation  Attention: Good  Concentration: Good  Memory: WNL  Fund of knowledge:  Good  Insight:   Fair  Judgment:  Good  Impulse Control: Good   Risk Assessment: Danger to Self:  No Patient denied current suicidal ideation  Self-injurious Behavior: No Danger to Others: No Patient denied current homicidal ideation Duty to Warn:no Physical Aggression / Violence:No  Access to Firearms a concern: No  Gang Involvement:No   Subjective: Patient reported her friend passed away the day after patient's last therapy session. Patient stated, "she was my go to person". Patient reported she has not heard from her friend's niece since her friend passed away and stated, "I will call the niece eventually". Patient stated, "not well" in response to coping with her friend's death. Patient reported her husband is trying to keep her busy and they have been going out to eat and took a recent trip to the beach. Patient reported she has been completing puzzles and playing a game on her Ipad. Patient stated, "as stable as it can be" in response to patient's mood.  Patient stated, "trying to be positive" in response to coping strategies. Patient stated, "I would like closure on it". During session, patient shared the story of how she met her friend and characteristics about her friend. Patient stated, "now I have a big hole" in response to the loss of her  friend. Patient reported she lost another friend in July and a childhood friend. Patient reported she is afraid to make friends due to multiple losses.  Interventions: Cognitive Behavioral Therapy and supportive therapy.   Clinician conducted session in person at clinician's office at [facility]. Provided supportive therapy, active and reflective listening, and validation as patient discussed the recent loss of her friend and patient's thoughts/feelings in response. Provided psycho education related to grief. Explored activities patient has been participating in to cope with the recent loss.   Collaboration of Care: Other not required at this time   Diagnosis:  Adjustment disorder with anxious mood     Plan: Patient is to utilize Cognitive Behavioral Therapy, thought re-framing, positive self talk, and coping strategies to decrease symptoms associated with their diagnosis. Frequency: bi-weekly  Modality: individual      Long-term goal:   Reduce overall level, frequency, and intensity of the feelings of anxiety as evidenced by decrease in anxiety, worry, crying, and feeling uneasy from 3 to 4 days/week to 0 to 1 days/week per client report for at least 3 consecutive months. Target Date: 08/22/23  Progress: progressing    Short-term goal:  Develop and implement effective and healthy communication strategies for patient to utilize to vocalize her thoughts and feelings to others in a controlled and assertive way  Target Date: 02/21/23  Progress: progressing    Identify negative self talk and negative thoughts used to reinforce low self esteem and replace with positive self talk and positive beliefs Target Date: 02/21/23  Progress: progressing  Katherina Right, LCSW

## 2022-09-19 NOTE — Progress Notes (Signed)
                Arvella Massingale, LCSW 

## 2022-10-03 ENCOUNTER — Other Ambulatory Visit: Payer: Self-pay | Admitting: Family

## 2022-10-17 ENCOUNTER — Ambulatory Visit: Payer: Medicare Other | Admitting: Clinical

## 2022-10-17 DIAGNOSIS — F4322 Adjustment disorder with anxiety: Secondary | ICD-10-CM

## 2022-10-17 NOTE — Progress Notes (Signed)
[facility] Counselor/Therapist Progress Note  Patient ID: Sherry Lamb, MRN: [id number],    Date: 10/17/2022  Time Spent: 10:41am - 11:27am : 46 minutes   Treatment Type: Individual Therapy  Reported Symptoms: Patient reported fatigue and feeling overwhelmed  Mental Status Exam: Appearance:  Well Groomed     Behavior: Appropriate  Motor: Normal  Speech/Language:  Clear and Coherent  Affect: Tearful  Mood: Patient reported feeling "unsettled"  Thought process: normal  Thought content:   WNL  Sensory/Perceptual disturbances:   WNL  Orientation: oriented to person, place, and situation  Attention: Good  Concentration: Good  Memory: WNL  Fund of knowledge:  Good  Insight:   Fair  Judgment:  Good  Impulse Control: Good   Risk Assessment: Danger to Self:  No Patient denied current suicidal ideation  Self-injurious Behavior: No Danger to Others: No Patient denied current homicidal ideation Duty to Warn:no Physical Aggression / Violence:No  Access to Firearms a concern: No  Gang Involvement:No   Subjective: Patient's husband reported he experienced a mild heart attack and was in the hospital for several days. Patient stated, "its been horrible". Patient reported her husband had a heart attack two weeks ago and a stent was put in. Patient reported she was scared when her husband experienced the heart attack. Patient stated, "Its such a shock, its something we never expected". Patient stated, "I'm happy he's home, I don't want to let him out of my sight", "its overwhelming to be hit with this all the sudden". Patient stated, "I worry" and reported concern regarding her husband's cough.  Patient reported her daughter has scheduled patient for an appointment at [facility] to discuss volunteering.  Patient reported she does not want to attend the appointment at [facility] today. Patient stated, "right now Im in a mood that I don't want to be bothered". Patient reported she  misses her friend and stated, "I think about her all the time". Patient reported her husband's recent hospitalization has triggered thoughts about the loss of patient's friend. Patient stated "it was a shock" in response to the loss of her friend. Patient reported feeling tired and reported feeling "unsettled" when clinician inquired about patient's mood. Patient stated, "everything seems overwhelming" and reported she does not want to perform tasks/participate in activities.   Interventions: Cognitive Behavioral Therapy and supportive therapy.   Clinician conducted session in person at clinician's office at [facility]. Patient provided verbal consent for patient's husband to attend the initial portion of today's session. Provided supportive therapy, active and reflective listening, and validation as patient discussed husband's recent hospitalization and patient's thoughts/feelings in response. Explored triggers for worry. Assessed patient's mood. Provided psycho education related to use of behavioral activation and gratitude exercises. Discussed use of a gratitude journal.    Collaboration of Care: Other not required at this time   Diagnosis:  Adjustment disorder with anxious mood     Plan: Patient is to utilize Cognitive Behavioral Therapy, thought re-framing, positive self talk, and coping strategies to decrease symptoms associated with their diagnosis. Frequency: bi-weekly  Modality: individual      Long-term goal:   Reduce overall level, frequency, and intensity of the feelings of anxiety as evidenced by decrease in anxiety, worry, crying, and feeling uneasy from 3 to 4 days/week to 0 to 1 days/week per client report for at least 3 consecutive months. Target Date: 08/22/23  Progress: progressing    Short-term goal:  Develop and implement effective and healthy communication strategies  for patient to utilize to vocalize her thoughts and feelings to others in a controlled and  assertive way  Target Date: 02/21/23  Progress: progressing    Identify negative self talk and negative thoughts used to reinforce low self esteem and replace with positive self talk and positive beliefs Target Date: 02/21/23  Progress: progressing                           Doree Barthel, LCSW                 Doree Barthel, LCSW

## 2022-10-20 ENCOUNTER — Encounter: Payer: Self-pay | Admitting: Family

## 2022-10-20 ENCOUNTER — Encounter: Payer: Self-pay | Admitting: Neurology

## 2022-10-24 NOTE — Telephone Encounter (Signed)
Form in your in box for review. Do you need to see in office for follow up to fill out?

## 2022-10-24 NOTE — Telephone Encounter (Signed)
Yes unfortunately this will require office visit for documentation and to complete the form.

## 2022-10-26 ENCOUNTER — Ambulatory Visit: Payer: Medicare Other | Admitting: Family

## 2022-10-26 ENCOUNTER — Encounter: Payer: Self-pay | Admitting: Family

## 2022-10-26 VITALS — BP 126/74 | HR 84 | Temp 98.0°F | Ht 63.0 in | Wt 143.0 lb

## 2022-10-26 DIAGNOSIS — F4323 Adjustment disorder with mixed anxiety and depressed mood: Secondary | ICD-10-CM | POA: Diagnosis not present

## 2022-10-26 DIAGNOSIS — R413 Other amnesia: Secondary | ICD-10-CM | POA: Insufficient documentation

## 2022-10-26 DIAGNOSIS — F0280 Dementia in other diseases classified elsewhere without behavioral disturbance: Secondary | ICD-10-CM

## 2022-10-26 DIAGNOSIS — I679 Cerebrovascular disease, unspecified: Secondary | ICD-10-CM | POA: Diagnosis not present

## 2022-10-26 DIAGNOSIS — G309 Alzheimer's disease, unspecified: Secondary | ICD-10-CM

## 2022-10-26 NOTE — Assessment & Plan Note (Signed)
Continue with medications as prescribed and f/u with neurology as scheduled.  Form being filled out for adult daycare. Pt is not risk to self and or others. Does require redirection and repeat instructions at times. Today on exam AO x 3, oriented to time place season and president.

## 2022-10-26 NOTE — Assessment & Plan Note (Signed)
Stable

## 2022-10-26 NOTE — Assessment & Plan Note (Signed)
Some depression  I think adult day care will be a good place for socialization

## 2022-10-26 NOTE — Progress Notes (Signed)
Established Patient Office Visit  Subjective:      CC:  Chief Complaint  Patient presents with   Form Completion    HPI: Sherry Lamb is a 88 y.o. female presenting on 10/26/2022 for Form Completion .  Pt here accompanied by husband for evaluation for [facility].   Pt does have dementia with suspected alzheimer's, sees neurology Dr. Lucia Gaskins. Last visit 05/24/22. Pt husband and daughter report pt repeats questions frequently, husband pays bills but this is typical even prior to symptom onset. She has been requested to have a driving evaluation per her neurologist. No hallucinations or delusions or change in personality.   She has been evaluated with memory testing with Dr. Clayborn Heron, suspected alzheimers and has recommended FDG pet scan for ddx alzheimer's vs pseudodementia.   MRI brain 7/23 no acute abn, chronic small vessel ischemic changes. Cerebral and cerebellar atrophy      10/26/2022   10:29 AM 06/26/2022   11:07 AM 05/25/2022    9:34 AM  PHQ9 SCORE ONLY  PHQ-9 Total Score 0 3 14      10/26/2022   10:30 AM 06/26/2022   11:08 AM 05/25/2022    9:34 AM 05/25/2022    9:28 AM  GAD 7 : Generalized Anxiety Score  Nervous, Anxious, on Edge 0 1 2 2   Control/stop worrying 0 1 2 2   Worry too much - different things 0 1 2 2   Trouble relaxing 0 1 2 2   Restless 0 0 0 1  Easily annoyed or irritable 0 1 1 1   Afraid - awful might happen 0 0 0 0  Total GAD 7 Score 0 5 9 10   Anxiety Difficulty Not difficult at all Not difficult at all Not difficult at all Not difficult at all         Social history:  Relevant past medical, surgical, family and social history reviewed and updated as indicated. Interim medical history since our last visit reviewed.  Allergies and medications reviewed and updated.  DATA REVIEWED: CHART IN EPIC     ROS: Negative unless specifically indicated above in HPI.    Current Outpatient Medications:    aspirin EC 81 MG  tablet, Take 1 tablet by mouth daily at 12 noon., Disp: , Rfl:    CALCIUM PO, Take by mouth., Disp: , Rfl:    Cholecalciferol (VITAMIN D) 50 MCG (2000 UT) CAPS, Take 2,000 Units by mouth daily., Disp: , Rfl:    donepezil (ARICEPT) 10 MG tablet, Take 1 tablet (10 mg total) by mouth at bedtime., Disp: 90 tablet, Rfl: 3   memantine (NAMENDA) 10 MG tablet, Start with one pill at bedtime for 2 weeks and if no side effects can increase to 10mg  twice daily, Disp: 180 tablet, Rfl: 4   Polyvinyl Alcohol-Povidone (REFRESH OP), Place 1 drop into both eyes 2 (two) times daily., Disp: , Rfl:    rosuvastatin (CRESTOR) 5 MG tablet, TAKE 1 TABLET (5 MG TOTAL) BY MOUTH DAILY., Disp: 90 tablet, Rfl: 3   Turmeric 500 MG CAPS, Take 500 mg by mouth daily., Disp: , Rfl:       Objective:    BP 126/74   Pulse 84   Temp 98 F (36.7 C) (Temporal)   Ht 5\' 3"  (1.6 m)   Wt 143 lb (64.9 kg)   SpO2 96%   BMI 25.33 kg/m   Wt Readings from Last 3 Encounters:  10/26/22 143 lb (64.9 kg)  06/26/22 144 lb  9.6 oz (65.6 kg)  05/25/22 145 lb (65.8 kg)    Physical Exam Constitutional:      General: She is not in acute distress.    Appearance: Normal appearance. She is normal weight. She is not ill-appearing, toxic-appearing or diaphoretic.  HENT:     Head: Normocephalic.  Cardiovascular:     Rate and Rhythm: Normal rate.  Pulmonary:     Effort: Pulmonary effort is normal.  Musculoskeletal:        General: Normal range of motion.  Neurological:     General: No focal deficit present.     Mental Status: She is alert and oriented to person, place, and time. Mental status is at baseline.     Cranial Nerves: Cranial nerves 2-12 are intact. No cranial nerve deficit.     Motor: Motor function is intact.     Coordination: Coordination is intact.     Gait: Gait is intact.  Psychiatric:        Mood and Affect: Mood normal.        Behavior: Behavior normal.        Thought Content: Thought content normal.         Judgment: Judgment normal.           Assessment & Plan:  Alzheimer disease (HCC) Assessment & Plan: Continue with medications as prescribed and f/u with neurology as scheduled.  Form being filled out for adult daycare. Pt is not risk to self and or others. Does require redirection and repeat instructions at times. Today on exam AO x 3, oriented to time place season and president.    Short-term memory loss  Cerebral vascular disease Assessment & Plan: Stable.    Adjustment reaction with anxiety and depression Assessment & Plan: Some depression  I think adult day care will be a good place for socialization       Return if symptoms worsen or fail to improve.  Mort Sawyers, MSN, APRN, FNP-C [facility]

## 2022-10-27 NOTE — Telephone Encounter (Signed)
Form faxed to number provided. Holding for 10 days then will send to scan

## 2022-10-27 NOTE — Telephone Encounter (Signed)
Please fax form to below (in outbox)  [facility] number is: [phone] The Fax should be directed to "Admissions Coordinator for Adult Day Program"

## 2022-10-27 NOTE — Telephone Encounter (Signed)
Completed form, however I did note that it did request for TB. Will reach out to daughter to see if this is required or just recommended.   Scan form, and daughter is trying to see if she can get fax to send it over.

## 2022-11-06 NOTE — Telephone Encounter (Signed)
Can you check into this? Are you still holding the paperwork?

## 2022-11-08 ENCOUNTER — Encounter: Payer: Self-pay | Admitting: Neurology

## 2022-11-08 ENCOUNTER — Ambulatory Visit: Payer: Medicare Other | Admitting: Neurology

## 2022-11-08 VITALS — BP 134/80 | HR 87 | Ht 64.0 in | Wt 142.8 lb

## 2022-11-08 DIAGNOSIS — F0390 Unspecified dementia without behavioral disturbance: Secondary | ICD-10-CM

## 2022-11-08 DIAGNOSIS — G3184 Mild cognitive impairment, so stated: Secondary | ICD-10-CM | POA: Diagnosis not present

## 2022-11-08 NOTE — Telephone Encounter (Signed)
Copy printed for patient daughter. She has came by office and picked up. Will call back if any questions.

## 2022-11-08 NOTE — Patient Instructions (Signed)
Lecanemab Injection What is this medication? LECANEMAB (lek AN e mab) treats Alzheimer disease. It works by decreasing the buildup of amyloid, a protein that may cause Alzheimer disease. This may slow down the worsening of symptoms. It is a monoclonal antibody. This medicine may be used for other purposes; ask your health care provider or pharmacist if you have questions. COMMON BRAND NAME(S): LEQEMBI What should I tell my care team before I take this medication? They need to know if you have any of these conditions: An unusual or allergic reaction to lecanemab, other medications, foods, dyes, or preservatives Take medications that treat or prevent blood clots Pregnant or trying to get pregnant Breast-feeding How should I use this medication? This medication is injected into a vein. It is given by your care team in a hospital or clinic setting. A special MedGuide will be given to you before each treatment. Be sure to read this information carefully each time. Talk to your care team about the use of this medication in children. Special care may be needed. Overdosage: If you think you have taken too much of this medicine contact a poison control center or emergency room at once. NOTE: This medicine is only for you. Do not share this medicine with others. What if I miss a dose? Keep appointments for follow-up doses. It is important not to miss your dose. Call your care team if you are unable to keep an appointment. What may interact with this medication? Interactions have not been studied. This list may not describe all possible interactions. Give your health care provider a list of all the medicines, herbs, non-prescription drugs, or dietary supplements you use. Also tell them if you smoke, drink alcohol, or use illegal drugs. Some items may interact with your medicine. What should I watch for while using this medication? Visit your care team for regular checks on your progress. Tell your care  team if your symptoms do not start to get better or if they get worse. What side effects may I notice from receiving this medication? Side effects that you should report to your care team as soon as possible: Allergic reactions or angioedema--skin rash, itching or hives, swelling of the face, eyes, lips, tongue, arms, or legs, trouble swallowing or breathing Headache, worsening confusion, dizziness, change in vision, nausea, seizures Infusion reactions--chest pain, shortness of breath or trouble breathing, feeling faint or lightheaded Side effects that usually do not require medical attention (report these to your care team if they continue or are bothersome): Cough Diarrhea Headache This list may not describe all possible side effects. Call your doctor for medical advice about side effects. You may report side effects to FDA at 1-800-FDA-1088. Where should I keep my medication? This medication is given in a hospital or clinic. It will not be stored at home. NOTE: This sheet is a summary. It may not cover all possible information. If you have questions about this medicine, talk to your doctor, pharmacist, or health care provider.  2024 Elsevier/Gold Standard (2022-07-02 00:00:00)

## 2022-11-08 NOTE — Telephone Encounter (Signed)
[phone] Sherry Lamb is on her way to pick up FL2 paperwork.

## 2022-11-08 NOTE — Telephone Encounter (Signed)
Daughter calling again to check on this.  Can we double check this was faxed? And or fax again?

## 2022-11-08 NOTE — Progress Notes (Signed)
[facility]    Provider:  Dr Lucia Gaskins Requesting Provider: Mort Sawyers, FNP Primary Care Provider:  Mort Sawyers, FNP  CC:  daughter is concerned about patient asking same questions as well as family history of possibly dementia.  11/08/2022: Here with husband and daughter to follow up about testing:  Reviewed notes, labs and imaging from outside physicians personally and with family which showed:: - ATN showed A low beta-amyloid 42/40 and a high pTau181 concentration were observed. A normal NfL concentration was observed at this time. Discussed These results are consistent with the presence of Alzheimer's related pathology.  - Genetic testing showed E2/E3 (no e4 which is good news, discussed role of this in lecanumab treatment and in alzheimer's risk overall) - MRI last July/2023 significant for  Mild chronic small vessel ischemic changes within the cerebral white matter and Mild generalized cerebral and cerebellar atrophy. - FDG Pet Scan showed: brain amyloid and is most consistent with the presence of moderate to frequent neuritic beta-amyloid plaques in the brain - formal memory testing showed MCI but likely prodromal Alzheimers (" In your case, your test data do not look exactly like what I would expect in the setting of a classic presentation of Alzheimer's, because you are still retaining information over time. 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 We discussed lecanemab and risks based on results above.  Patient complains of symptoms per HPI as well as the following symptoms: anxiety, recent MI . Pertinent negatives and positives per HPI. All others negative   HPI 05/24/2022:  Sherry Lamb is  a 88 y.o. female here as requested by Mort Sawyers, FNP for dementia? In the spring daughter started noticing her mother was repeating questions and daughter was worried about. Mother had memory problems, grandmother had memory problems and dementia, undiagnosed. Daughter is here and provides most information. Grandmother had dementia starting about 30. Patient states her problems started 4 years ago with a move, she was "down" for a long time then covid came in on top of it, patient feels sad most days, daughter notices during the spring that patient had knee replacements and she had books to read by authors and typically patient is an avid reader and mother went by and patient did not feel like reading, patient would talk about loss of interest in hobbies, patient and husband live in an area and have struggled to connect since moving, now patient is going to silver sneakers. Patient reports her knees have been sore. Patient lives in a [place], husband pays the bills but has always done so, she drives and doesn't gets lost she drives locally, daughter asked her father and father has noticed she asks the same questionsin the same days, patient does not have hearing problems. She performs all her ADLs, IADLs, they both use smart phones well, they have a maid who cleans the house, husband and patient cook but use frozen food and go out to eat. No hallucinations or delusions. No changes in personality. No other focal neurologic deficits, associated symptoms, inciting events or modifiable factors.   Reviewed notes, labs and imaging from outside physicians, which showed:  10/2021: B12, rpr, tsh, cbc, cmp normal   MRI brain 11/2021: IMPRESSION: reviewed imaging (also with  daughter and patient) and agree 1. No evidence of acute intracranial abnormality. 2. Mild chronic small vessel ischemic changes within the cerebral white matter. 3. Mild generalized cerebral and cerebellar atrophy. 4. Paranasal sinus disease,  as described.    Review of Systems: Patient complains of symptoms per HPI as well as the following symptoms repeating questions. Pertinent negatives and positives per HPI. All others negative.   Social History   Socioeconomic History   Marital status: Married    Spouse name: Molly Maduro   Number of children: 1   Years of education: secretarial school   Highest education level: Not on file  Occupational History    Employer: OTHER  Tobacco Use   Smoking status: Never   Smokeless tobacco: Never  Vaping Use   Vaping Use: Never used  Substance and Sexual Activity   Alcohol use: Yes    Alcohol/week: 4.0 standard drinks of alcohol    Types: 4 Glasses of wine per week    Comment: 1/2 glass of wine nightly   Drug use: Never   Sexual activity: Yes    Partners: Male    Birth control/protection: Post-menopausal, Surgical    Comment: Hysterectomy  Other Topics Concern   Not on file  Social History Narrative   07/07/20   From: [place], [place] to be near family   Living: with husband Molly Maduro (1966)   Work: retired from music business - [employer]      Family: daughter Victorino Dike - no children      Enjoys: play tennis (though cannot play), reading, music      Exercise: walking, tries to do her knee therapy   Diet: generally      Safety   Seat belts: Yes    Guns: No   Safe in relationships: Yes    Social Determinants of Health   Financial Resource Strain: Low Risk  (01/20/2022)   Overall Financial Resource Strain (CARDIA)    Difficulty of Paying Living Expenses: Not hard at all  Food Insecurity: No Food Insecurity (01/20/2022)   Hunger Vital Sign    Worried About Running Out of Food in the Last Year: Never true    Ran Out of Food in the Last Year: Never true  Transportation Needs: No Transportation Needs (01/20/2022)   PRAPARE - Administrator, Civil Service (Medical): No    Lack of Transportation (Non-Medical): No  Physical Activity: Sufficiently  Active (01/20/2022)   Exercise Vital Sign    Days of Exercise per Week: 4 days    Minutes of Exercise per Session: 40 min  Stress: No Stress Concern Present (01/20/2022)   [employer] of Occupational Health - Occupational Stress Questionnaire    Feeling of Stress : Only a little  Social Connections: Moderately Integrated (01/20/2022)   Social Connection and Isolation Panel [NHANES]    Frequency of Communication with Friends and Family: More than three times a week    Frequency of Social Gatherings with Friends and Family: Never    Attends Religious Services: More than 4 times per year    Active Member of [employer] or Organizations: No    Attends Banker Meetings: Never    Marital Status: Married  Catering manager Violence: Not At Risk (01/20/2022)   Humiliation, Afraid, Rape, and Kick questionnaire    Fear of Current or Ex-Partner: No    Emotionally Abused: No    Physically Abused: No    Sexually Abused: No    Family  History  Problem Relation Age of Onset   Arthritis Mother    Asthma Mother    Hearing loss Mother    Osteoporosis Mother    Heart disease Father    Hyperlipidemia Father    Kidney disease Father    Depression Brother    Hyperlipidemia Brother    Arrhythmia Brother        Visual merchandiser   Dementia Maternal Grandmother    Breast cancer Neg Hx     Past Medical History:  Diagnosis Date   Arthritis    Frequent headaches    GERD (gastroesophageal reflux disease)    Hyperlipidemia    Pneumonia    PONV (postoperative nausea and vomiting)     Patient Active Problem List   Diagnosis Date Noted   Mild major neurocognitive disorder due to probable Alzheimer's disease, without behavioral disturbance (HCC) 11/12/2022   Alzheimer disease (HCC) 10/26/2022   Short-term memory loss 10/26/2022   Adjustment reaction with anxiety and depression 05/25/2022   FHx: memory loss 01/19/2022   Brain atrophy (HCC) 11/28/2021   Cerebral vascular disease 11/28/2021    Amnestic MCI (mild cognitive impairment with memory loss) 10/17/2021   Insomnia due to medical condition 10/17/2021   Osteoporosis 07/07/2020   History of total right knee replacement 03/08/2020   Osteoarthritis of right knee 02/23/2020   Lumbar radiculopathy 09/27/2018   Degeneration of lumbar intervertebral disc 08/20/2018   Bilateral chronic knee pain 06/06/2018   Hyperlipidemia    GERD (gastroesophageal reflux disease)    Frequent headaches    History of total hip arthroplasty 06/15/2015   Trochanteric bursitis of left hip 06/15/2015   Primary localized osteoarthritis of pelvic region and thigh 05/19/2013    Past Surgical History:  Procedure Laterality Date   ABDOMINAL HYSTERECTOMY     ANKLE SURGERY     EYE SURGERY     Left hip replacement Left 2013   right hip replacement Right 2000   TONSILLECTOMY AND ADENOIDECTOMY  1943   TOTAL KNEE ARTHROPLASTY Right 02/23/2020   Procedure: TOTAL KNEE ARTHROPLASTY;  Surgeon: Ollen Gross, MD;  Location: [facility];  Service: Orthopedics;  Laterality: Right;    TOTAL KNEE ARTHROPLASTY Left 04/18/2021   Procedure: TOTAL KNEE ARTHROPLASTY;  Surgeon: Ollen Gross, MD;  Location: [facility];  Service: Orthopedics;  Laterality: Left;   WISDOM TOOTH EXTRACTION      Current Outpatient Medications  Medication Sig Dispense Refill   aspirin EC 81 MG tablet Take 1 tablet by mouth daily at 12 noon.     CALCIUM PO Take by mouth.     Cholecalciferol (VITAMIN D) 50 MCG (2000 UT) CAPS Take 2,000 Units by mouth daily.     donepezil (ARICEPT) 10 MG tablet Take 1 tablet (10 mg total) by mouth at bedtime. 90 tablet 3   memantine (NAMENDA) 10 MG tablet Start with one pill at bedtime for 2 weeks and if no side effects can increase to 10mg  twice daily 180 tablet 4   Polyvinyl Alcohol-Povidone (REFRESH OP) Place 1 drop into both eyes 2 (two) times daily.     rosuvastatin (CRESTOR) 5 MG tablet TAKE 1 TABLET (5 MG TOTAL) BY MOUTH DAILY. 90 tablet 3   Turmeric  500 MG CAPS Take 500 mg by mouth daily.     No current facility-administered medications for this visit.    Allergies as of 11/08/2022 - Review Complete 11/08/2022  Allergen Reaction Noted   Penicillins Other (See Comments) 09/10/2017   Mirtazapine Other (See  Comments) 06/26/2022   Prednisone Other (See Comments) 02/16/2020    Vitals: BP 134/80 (BP Location: Right Arm, Patient Position: Sitting, Cuff Size: Normal)   Pulse 87   Ht 5\' 4"  (1.626 m)   Wt 142 lb 12.8 oz (64.8 kg)   BMI 24.51 kg/m  Last Weight:  Wt Readings from Last 1 Encounters:  11/08/22 142 lb 12.8 oz (64.8 kg)   Last Height:   Ht Readings from Last 1 Encounters:  11/08/22 5\' 4"  (1.626 m)   Physical exam: Exam: Gen: NAD, conversant, well nourised, well groomed                     CV: RRR, no MRG. No Carotid Bruits. No peripheral edema, warm, nontender Eyes: Conjunctivae clear without exudates or hemorrhage  Neuro: Detailed Neurologic Exam  Speech:    Speech is normal; fluent and spontaneous with normal comprehension.  Cognition:    11/08/2022    3:31 PM 05/24/2022   11:18 AM 01/19/2022   11:44 AM  MMSE - Mini Mental State Exam  Orientation to time 4 4 5   Orientation to Place 4 3 4   Registration 3 3 3   Attention/ Calculation 1 1 3   Recall 3 3 2   Language- name 2 objects 2 2 2   Language- repeat 1 1 1   Language- follow 3 step command 3 3 3   Language- read & follow direction 1 1 1   Write a sentence 0 1 1  Copy design 0 0 0  Total score 22 22 25     Cranial Nerves:    The pupils are equal, round, and reactive to light. The fundi are normal and spontaneous venous pulsations are present. Visual fields are full to finger confrontation. Extraocular movements are intact. Trigeminal sensation is intact and the muscles of mastication are normal. The face is symmetric. The palate elevates in the midline. Hearing intact. Voice is normal. Shoulder shrug is normal. The tongue has normal motion without  fasciculations.   Coordination: nml  Gait: nml  Motor Observation:    No asymmetry, no atrophy, and no involuntary movements noted. Tone:    Normal muscle tone.    Posture:    Posture is normal. normal erect    Strength:    Strength is V/V in the upper and lower limbs.      Sensation: intact to LT     Reflex Exam:  DTR's:    Deep tendon reflexes in the upper and lower extremities are symmetrical bilaterally.   Toes:    The toes are equiv bilaterally.   Clonus:    Clonus is absent.     Assessment/Plan:  Patient with a Fhx of possibly dementia, daughter is concerned because patient repeats questions. She does appear to be repetitive today and at one point seemed to forget we reviewed her brain.last 01/2022 MMSE 25/30 and today was22/30 . She does appear to have anxiety and depression and the move here to [place] was difficult for her right before the pandemic, this may be impacting her as well as recent medical problems.   Here with husband and daughter to follow up about testing: Mild neurocognitive degenerative disorder due to probable alzheimers  Reviewed notes, labs and imaging from outside physicians personally and with family which showed:: - ATN showed A low beta-amyloid 42/40 and a high pTau181 concentration were observed. A normal NfL concentration was observed at this time. Discussed These results are consistent with the presence of Alzheimer's related pathology.  -  Genetic testing showed E2/E3 (no e4 which is good news, discussed role of this in lecanumab treatment and in alzheimer's risk overall) - MRI last July/2023 significant for  Mild chronic small vessel ischemic changes within the cerebral white matter and Mild generalized cerebral and cerebellar atrophy. - FDG Pet Scan showed: brain amyloid and is most consistent with the presence of moderate to frequent neuritic beta-amyloid plaques in the brain - formal memory testing showed MCI but likely prodromal Alzheimers ("  In your case, your test data do not look exactly like what I would expect in the setting of a classic presentation of Alzheimer's, because you are still retaining information over time. 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 We discussed lecanemab and risks based on results above.  Recommended to patient and daughter: "The XX Brain" Chana Bode  "MIND" diet Margette Fast [facility] Healthy Brain Study  Discussed Dementia vs MCI vs nornal cognitive aging  Answered all questions freom daughter and husband and patient.   Patient here for follow up on testing. She did not have an ApoE4 gene  discussed lecanumab: Patient's been diagnosed with mild cognitive impairment  PET scan showed amyloid in the brain, markers were positive in the blood for Alzheimer's disease extended visit took myself over 50 minutes today to discuss Lecantemab and and answer all questions.  I also performed. functional assessment scale today.  We discussed the following and more, I provided literature for lecanemab they will think about it:  The main challenge will be arranging the follow up MRIs (baseline, before 5, 7, 14th infusions).   Below is directly from the literature.  I provided more literature.  They understand the risks especially the increased bleeding and edema(ARIA)  "Incidence of ARIA Symptomatic ARIA occurred in 3% (29/898) of patients treated with [facility] in Study 2 [see Clinical Studies (14)]. Serious symptoms associated with ARIA were reported in 0.7% (6/898) of patients treated with LEQEMBI. Including asymptomatic radiographic events, ARIA was observed in 21% (191/898) of patients treated with [facility], compared to 9% (40/981) of patients  on placebo in Study 2   ApoE e4 Carrier Status and Risk of ARIA Approximately 15% of Alzheimer's disease patients are ApoE e4 homozygotes.   The incidence of ARIA was higher in ApoE e4 homozygotes (45% on LEQEMBI vs. 22% on placebo) than in heterozygotes (19% on LEQEMBI vs 9% on placebo) and noncarriers (13% on LEQEMBI vs 4% on placebo). Among patients treated with LEQEMBI, symptomatic ARIA-E occurred in 9% of ApoE e4 homozygotes compared with 2% of heterozygotes and 1% noncarriers. Serious events of ARIA occurred in 3% of ApoE e4 homozygotes, and approximately 1% of heterozygotes and noncarriers.  The recommendations on management of ARIA do not differ between ApoE e4 carriers and noncarriers [see Dosage and Administration (2.3)]. Testing for ApoE e4 status should be performed prior to initiation of treatment to inform the risk of developing ARIA. Prior to testing, prescribers should discuss with patients the risk of ARIA across genotypes and the implications of genetic testing results. Prescribers should inform patients that if genotype testing is not performed they can still be treated with [facility]; however, it cannot be determined if they are ApoE e4 homozygotes and at higher risk for ARIA.  An FDA-authorized test for the detection of ApoE e4 alleles to identify patients at risk of ARIA if treated with [facility] is not currently available. Currently available tests used to identify ApoE e4 alleles may vary in accuracy and design."   Cc: Mort Sawyers, FNP,  Mort Sawyers, FNP  Naomie Dean, MD  [facility] [address] [place], [place] [address]  Phone [phone] Fax [phone]  I spent over 50 minutes of face-to-face and non-face-to-face time with patient on the  1. Amnestic MCI (mild cognitive impairment with memory loss)   2. Mild major neurocognitive disorder due to probable Alzheimer's disease, without behavioral disturbance (HCC)     diagnosis.   This included previsit chart review, lab review, study review, order entry, electronic health record documentation, patient education on the different diagnostic and therapeutic options, counseling and coordination of care, risks and benefits of management, compliance, or risk factor reduction

## 2022-11-12 DIAGNOSIS — F0390 Unspecified dementia without behavioral disturbance: Secondary | ICD-10-CM | POA: Insufficient documentation

## 2022-11-15 ENCOUNTER — Ambulatory Visit: Payer: Medicare Other | Admitting: Clinical

## 2022-11-17 ENCOUNTER — Other Ambulatory Visit: Payer: Self-pay | Admitting: Neurology

## 2022-11-22 NOTE — Telephone Encounter (Signed)
 Rx sent 

## 2022-11-27 ENCOUNTER — Telehealth: Payer: Self-pay | Admitting: Family

## 2022-11-27 NOTE — Telephone Encounter (Signed)
[employer] called to report that they spoke to patient and she said that her dosage has been changed by provider to 1 tablet 2-3 times per week.He said that she would need the correct prescription called into the pharmacy so that they could cover it.  [id number]en, [place] - [address] Phone: [phone]  Fax: [phone]

## 2022-11-27 NOTE — Telephone Encounter (Signed)
Attempted to call number back that was documented in encounter. Unable to reach anyone.  Need more information.

## 2022-11-28 NOTE — Telephone Encounter (Signed)
Attempted to call Molly Maduro, spouse to clarify Crestor dose and schedule.  Per chart its Crestor  5mg  daily.

## 2022-11-29 ENCOUNTER — Ambulatory Visit: Payer: Medicare Other | Admitting: Podiatry

## 2022-11-29 NOTE — Telephone Encounter (Signed)
Called and spoke with patients daughter, she states she didn't know anything about the medication, crestor being changed. She was also unaware that [facility] had spoken to patient. She will check with patient and get back with [place] either through MyChart or phone.

## 2022-12-01 ENCOUNTER — Encounter: Payer: Self-pay | Admitting: Family

## 2022-12-01 ENCOUNTER — Ambulatory Visit: Payer: Medicare Other | Admitting: Podiatry

## 2022-12-08 NOTE — Telephone Encounter (Signed)
Pt called in requesting a call back . Regarding message that was left about medication refill . Stated she did not call in a RX refill . Please advise [phone]

## 2022-12-08 NOTE — Telephone Encounter (Signed)
Returned call to patient and informed her that it could be that her medications could be set up for an automatic reminder and that the last refill I see in her chart if for her rosuvastatin which was refilled back in May. She thanked me for calling and said she was not going to worry about it that she has enough mediations right now

## 2022-12-11 ENCOUNTER — Encounter: Payer: Self-pay | Admitting: Podiatry

## 2022-12-11 ENCOUNTER — Ambulatory Visit: Payer: Medicare Other | Admitting: Podiatry

## 2022-12-11 DIAGNOSIS — M79675 Pain in left toe(s): Secondary | ICD-10-CM | POA: Diagnosis not present

## 2022-12-11 DIAGNOSIS — B351 Tinea unguium: Secondary | ICD-10-CM

## 2022-12-11 DIAGNOSIS — M79674 Pain in right toe(s): Secondary | ICD-10-CM | POA: Diagnosis not present

## 2022-12-12 NOTE — Progress Notes (Signed)
  Subjective:  Patient ID: Sherry Lamb, female    DOB: [birth year],  MRN: [id number]  Chief Complaint  Patient presents with   Nail Problem    "Trim, basic up keep."    88 y.o. female presents with the above complaint. History confirmed with patient.  Her nails are thickened and elongated and painful.  Debridement has been helpful.  She reports no other new issues  Objective:  Physical Exam: warm, good capillary refill, no trophic changes or ulcerative lesions, normal DP and PT pulses and normal sensory exam.  She has thickened and elongated yellow toenails x10 with subungual debris  Assessment:   1. Pain due to onychomycosis of toenails of both feet      Plan:  Patient was evaluated and treated and all questions answered.   Discussed the etiology and treatment options for the condition in detail with the patient.  Regular intermittent debridement has been helpful in reducing pain and improving function. Recommended debridement of the nails today. Sharp and mechanical debridement performed of all painful and mycotic nails today. Nails debrided in length and thickness using a nail nipper to level of comfort. Discussed treatment options including appropriate shoe gear. Follow up as needed for painful nails.    Return in about 10 weeks (around 02/19/2023) for painful thick fungal nails.

## 2022-12-13 ENCOUNTER — Ambulatory Visit: Payer: Medicare Other | Admitting: Clinical

## 2022-12-13 DIAGNOSIS — F4323 Adjustment disorder with mixed anxiety and depressed mood: Secondary | ICD-10-CM | POA: Diagnosis not present

## 2022-12-13 NOTE — Progress Notes (Signed)
[facility] Counselor/Therapist Progress Note  Patient ID: Sherry Lamb, MRN: [id number],    Date: 12/13/2022  Time Spent: 9:35 am - 10:20 am : 45 minutes   Treatment Type: Individual Therapy  Reported Symptoms: Patient reported depressed mood  Mental Status Exam: Appearance:  Neat and Well Groomed     Behavior: Appropriate  Motor: Normal  Speech/Language:  Clear and Coherent  Affect: Flat  Mood: Patient stated, "not good".   Thought process: normal  Thought content:   WNL  Sensory/Perceptual disturbances:   WNL  Orientation: oriented to person, place, and situation  Attention: Good  Concentration: Good  Memory: Patient exhibits short term memory loss  Fund of knowledge:  Good  Insight:   Fair  Judgment:  Good  Impulse Control: Good   Risk Assessment: Danger to Self:  No Patient denied current suicidal ideation  Self-injurious Behavior: No Danger to Others: No Patient denied current homicidal ideation Duty to Warn:no Physical Aggression / Violence:No  Access to Firearms a concern: No  Gang Involvement:No   Subjective: Patient stated, "it hasn't been a good month". Patient reported she has not been feeling well. Patient reported she currently attends a program at [facility] in the "the memory unit". Patient stated, "usually I enjoy what we do". Patient reported she has breakfast, lunch, and participates in activities in the program. Patient reported she attends one day a week. Patient stated, "I feel like they're trying to control you". Patient reported she previously enjoyed the activities and stated, "now it's a question mark". Patient stated it's "regimented" and stated, "Im disenchanted" in regards to the program. Patient reported she was under the impression she could attend the program at her discretion. Patient stated, "It's like another thing you have to do". Patient stated, "I want my life back before I crossed that bridge" in regards to patient's move  from [place]. Patient stated, "I guess Im just not a happy camper this month". Patient reported she misses the views, the sounds, looking at the ocean in [place] and stated, "I miss everything". Patient stated, "I don't enjoy it here". Patient reported patient/husband moved to [place] for their daughter. Patient reported she has not felt happy since May 2023 when the family celebrated patient/daughter's birthday. Patient stated, "I haven't really done it" in response to gratitude journal and stated, "I'll try again". Patient reported she forgets to complete the gratitude journal  Interventions: Cognitive Behavioral Therapy and Supportive therapy. Clinician conducted session in person at clinician's office at [facility]. Provided supportive therapy and active listening as patient discussed patient's participation in a local program and patient's thoughts and feelings in response to the program. Assisted patient in exploring aspects patient misses about living in [place]. Explored factors patient dislikes about living in [place]. Assisted patient in exploring positive aspects about living in [place]. Reviewed gratitude journal and explored barriers to completing gratitude journal.      Collaboration of Care: Other not required at this time   Diagnosis:  Adjustment disorder with mixed anxiety and depressed mood     Plan: Patient is to utilize [employer], thought re-framing, positive self talk, and coping strategies to decrease symptoms associated with their diagnosis. Frequency: bi-weekly  Modality: individual      Long-term goal:   Reduce overall level, frequency, and intensity of the feelings of anxiety as evidenced by decrease in anxiety, worry, crying, and feeling uneasy from 3 to 4 days/week to 0 to 1  days/week per client report for at least 3 consecutive months. Target Date: 08/22/23  Progress: progressing    Short-term goal:   Develop and implement effective and healthy communication strategies for patient to utilize to vocalize her thoughts and feelings to others in a controlled and assertive way  Target Date: 02/21/23  Progress: progressing    Identify negative self talk and negative thoughts used to reinforce low self esteem and replace with positive self talk and positive beliefs Target Date: 02/21/23  Progress: progressing     Doree Barthel, LCSW

## 2022-12-13 NOTE — Progress Notes (Signed)
                 , LCSW 

## 2022-12-19 ENCOUNTER — Other Ambulatory Visit: Payer: Self-pay | Admitting: Neurology

## 2022-12-21 ENCOUNTER — Encounter (INDEPENDENT_AMBULATORY_CARE_PROVIDER_SITE_OTHER): Payer: Self-pay

## 2023-01-11 ENCOUNTER — Ambulatory Visit (INDEPENDENT_AMBULATORY_CARE_PROVIDER_SITE_OTHER): Payer: Medicare Other | Admitting: Clinical

## 2023-01-11 DIAGNOSIS — F4323 Adjustment disorder with mixed anxiety and depressed mood: Secondary | ICD-10-CM

## 2023-01-11 NOTE — Progress Notes (Signed)
                Karen Sharpe, LCSW 
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
## 2023-01-16 ENCOUNTER — Ambulatory Visit: Payer: Medicare Other | Admitting: Clinical

## 2023-01-23 ENCOUNTER — Ambulatory Visit: Payer: Medicare Other | Admitting: Clinical

## 2023-01-23 ENCOUNTER — Encounter: Payer: Self-pay | Admitting: Family

## 2023-01-23 ENCOUNTER — Ambulatory Visit (INDEPENDENT_AMBULATORY_CARE_PROVIDER_SITE_OTHER): Payer: Medicare Other | Admitting: Family

## 2023-01-23 VITALS — BP 104/66 | HR 72 | Temp 98.2°F | Ht 64.0 in | Wt 141.2 lb

## 2023-01-23 DIAGNOSIS — Z23 Encounter for immunization: Secondary | ICD-10-CM | POA: Diagnosis not present

## 2023-01-23 DIAGNOSIS — Z0000 Encounter for general adult medical examination without abnormal findings: Secondary | ICD-10-CM | POA: Insufficient documentation

## 2023-01-23 DIAGNOSIS — G3184 Mild cognitive impairment, so stated: Secondary | ICD-10-CM

## 2023-01-23 DIAGNOSIS — K219 Gastro-esophageal reflux disease without esophagitis: Secondary | ICD-10-CM

## 2023-01-23 DIAGNOSIS — E538 Deficiency of other specified B group vitamins: Secondary | ICD-10-CM | POA: Insufficient documentation

## 2023-01-23 DIAGNOSIS — Z79899 Other long term (current) drug therapy: Secondary | ICD-10-CM | POA: Diagnosis not present

## 2023-01-23 DIAGNOSIS — E785 Hyperlipidemia, unspecified: Secondary | ICD-10-CM

## 2023-01-23 DIAGNOSIS — M810 Age-related osteoporosis without current pathological fracture: Secondary | ICD-10-CM

## 2023-01-23 DIAGNOSIS — F4323 Adjustment disorder with mixed anxiety and depressed mood: Secondary | ICD-10-CM

## 2023-01-23 DIAGNOSIS — Z20822 Contact with and (suspected) exposure to covid-19: Secondary | ICD-10-CM

## 2023-01-23 NOTE — Progress Notes (Signed)
Subjective:  Patient ID: Sherry Lamb, female    DOB: 20-May-1934  Age: 88 y.o. MRN: [id number]  Patient Care Team: Mort Sawyers, FNP as PCP - General (Family Medicine) Derenda Fennel, [place] as Physician Assistant (Orthopedic Surgery) Ronita Hipps (Orthopedic Surgery)   CC:  Chief Complaint  Patient presents with   Annual Exam    HPI Sherry Lamb is a 88 y.o. female who presents today for an annual physical exam. She reports consuming a general diet. The patient does not participate in regular exercise at present. She generally feels well. She reports sleeping fairly well. She does have additional problems to discuss today.   Vision:Within last year Dental:Receives regular dental care  Bone density scan: July 19, 2020 , left forearm osteoporosis, -3.8  Pt is with acute concerns.  Yesterday started with some nasal congestion, sinus pressure No sore throat. No ear pain.  Very runny nose not coughing. No fever.  Is ok with flu and pneumonia vaccination. Is feeling ill today so maybe will come back.   Was on fosamax years ago for osteoporosis, restarted about two years ago but ten decided to stop. Has not had a repeat dexa since 2020.     Advanced Directives Patient does not have advanced directives   DEPRESSION SCREENING    01/23/2023   10:09 AM 10/26/2022   10:29 AM 06/26/2022   11:07 AM 05/25/2022    9:34 AM 05/25/2022    9:28 AM 01/20/2022   10:31 AM 10/17/2021   11:39 AM  PHQ 2/9 Scores  PHQ - 2 Score 0 0 2 4 4 2 4   PHQ- 9 Score 0 0 3 14 14 4 12      ROS: Negative unless specifically indicated above in HPI.    Current Outpatient Medications:    aspirin EC 81 MG tablet, Take 1 tablet by mouth daily at 12 noon., Disp: , Rfl:    CALCIUM PO, Take by mouth., Disp: , Rfl:    Cholecalciferol (VITAMIN D) 50 MCG (2000 UT) CAPS, Take 2,000 Units by mouth daily., Disp: , Rfl:    donepezil (ARICEPT) 10 MG tablet, Take 1 tablet (10 mg total) by mouth at  bedtime., Disp: 90 tablet, Rfl: 3   memantine (NAMENDA) 10 MG tablet, Take 1 tablet (10 mg total) by mouth 2 (two) times daily., Disp: 180 tablet, Rfl: 2   Polyvinyl Alcohol-Povidone (REFRESH OP), Place 1 drop into both eyes 2 (two) times daily., Disp: , Rfl:    rosuvastatin (CRESTOR) 5 MG tablet, TAKE 1 TABLET (5 MG TOTAL) BY MOUTH DAILY., Disp: 90 tablet, Rfl: 3   Turmeric 500 MG CAPS, Take 500 mg by mouth daily., Disp: , Rfl:     Objective:    BP 104/66 (BP Location: Left Arm, Patient Position: Sitting, Cuff Size: Normal)   Pulse 72   Temp 98.2 F (36.8 C) (Temporal)   Ht 5\' 4"  (1.626 m)   Wt 141 lb 3.2 oz (64 kg)   SpO2 98%   BMI 24.24 kg/m   BP Readings from Last 3 Encounters:  01/23/23 104/66  11/08/22 134/80  10/26/22 126/74      Physical Exam Vitals reviewed.  Constitutional:      General: She is not in acute distress.    Appearance: Normal appearance. She is normal weight. She is not ill-appearing.  HENT:     Head: Normocephalic.     Right Ear: Tympanic membrane normal.     Left Ear: Tympanic membrane normal.  Nose: Nose normal.     Mouth/Throat:     Mouth: Mucous membranes are moist.  Eyes:     Extraocular Movements: Extraocular movements intact.     Pupils: Pupils are equal, round, and reactive to light.  Cardiovascular:     Rate and Rhythm: Normal rate and regular rhythm.  Pulmonary:     Effort: Pulmonary effort is normal.     Breath sounds: Normal breath sounds.  Abdominal:     General: Abdomen is flat. Bowel sounds are normal.     Palpations: Abdomen is soft.     Tenderness: There is no guarding or rebound.  Musculoskeletal:        General: Normal range of motion.     Cervical back: Normal range of motion.  Skin:    General: Skin is warm.     Capillary Refill: Capillary refill takes less than 2 seconds.  Neurological:     General: No focal deficit present.     Mental Status: She is alert.  Psychiatric:        Mood and Affect: Mood normal.         Behavior: Behavior normal.        Thought Content: Thought content normal.        Judgment: Judgment normal.          Assessment & Plan:  Low serum vitamin B12 -     Vitamin B12; Future  Hyperlipidemia, unspecified hyperlipidemia type Assessment & Plan: Ordered lipid panel, pending results. Work on low cholesterol diet and exercise as tolerated Continue rosuvastatin as prescribed.    Orders: -     Lipid panel; Future  Encounter for general adult medical examination without abnormal findings Assessment & Plan: Patient Counseling(The following topics were reviewed):  Preventative care handout given to pt  Health maintenance and immunizations reviewed. Please refer to Health maintenance section. Pt advised on safe sex, wearing seatbelts in car, and proper nutrition labwork ordered today for annual Dental health: Discussed importance of regular tooth brushing, flossing, and dental visits.   Orders: -     Pneumococcal conjugate vaccine 20-valent -     Lipid panel; Future -     Vitamin B12; Future -     CBC; Future  On statin therapy -     Comprehensive metabolic panel; Future  Osteoporosis, unspecified osteoporosis type, unspecified pathological fracture presence Assessment & Plan: No longer on therapy repeat dexa.  Will then discuss options, consider prolia   Orders: -     DG Bone Density; Future  Amnestic MCI (mild cognitive impairment with memory loss) Assessment & Plan: Continue medications as prescribed.  Cont f/u as scheduled with neurology    Gastroesophageal reflux disease, unspecified whether esophagitis present Assessment & Plan: stable   Adjustment reaction with anxiety and depression Assessment & Plan: Stable Pt following with therapist   Contact with and (suspected) exposure to covid-19 -     POC COVID-19 BinaxNow  Encounter for immunization -     Flu Vaccine Trivalent High Dose (Fluad)      Follow-up: Return in about 1 year  (around 01/23/2024) for f/u CPE.   Mort Sawyers, FNP

## 2023-01-23 NOTE — Assessment & Plan Note (Signed)
Stable Pt following with therapist

## 2023-01-23 NOTE — Patient Instructions (Addendum)
I have sent an electronic order over to your preferred location for the following:   []   2D Mammogram  []   3D Mammogram  [x]   Bone Density   Please give this center a call to get scheduled at your convenience.  [x]   [facility]  [address] [place] [address]  [phone]  Make sure to wear two piece  clothing  No lotions powders or deodorants the day of the appointment Make sure to bring picture ID and insurance card.  Bring list of medications you are currently taking including any supplements.

## 2023-01-23 NOTE — Progress Notes (Signed)
                Dezi Schaner, LCSW 

## 2023-01-23 NOTE — Assessment & Plan Note (Signed)
Continue medications as prescribed.  Cont f/u as scheduled with neurology

## 2023-01-23 NOTE — Assessment & Plan Note (Signed)
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
## 2023-01-23 NOTE — Assessment & Plan Note (Signed)
No longer on therapy repeat dexa.  Will then discuss options, consider prolia

## 2023-01-23 NOTE — Assessment & Plan Note (Signed)
stable °

## 2023-01-23 NOTE — Assessment & Plan Note (Signed)
Ordered lipid panel, pending results. Work on low cholesterol diet and exercise as tolerated Continue rosuvastatin as prescribed.

## 2023-02-13 ENCOUNTER — Ambulatory Visit: Payer: Medicare Other | Admitting: Clinical

## 2023-02-13 DIAGNOSIS — F4323 Adjustment disorder with mixed anxiety and depressed mood: Secondary | ICD-10-CM | POA: Diagnosis not present

## 2023-02-13 NOTE — Progress Notes (Signed)
[facility] Counselor/Therapist Progress Note  Patient ID: Sherry Lamb, MRN: [id number],    Date: 02/13/2023  Time Spent: 10:37am - 11:20am : 43 minutes   Treatment Type: Individual Therapy  Reported Symptoms: Patient stated, "Im in a bad state today".  Mental Status Exam: Appearance:  Neat and Well Groomed     Behavior: Appropriate  Motor: Normal  Speech/Language:  Clear and Coherent  Affect: Appropriate  Mood: Patient stated, "Im in a bad state today" in response to mood  Thought process: normal  Thought content:   WNL  Sensory/Perceptual disturbances:   WNL  Orientation: oriented to person, place, and situation  Attention: Good  Concentration: Good  Memory: Patient exhibits short term memory loss  Fund of knowledge:  Good  Insight:   Fair  Judgment:  Good  Impulse Control: Good   Risk Assessment: Danger to Self:  No Patient denied current suicidal ideation  Self-injurious Behavior: No Danger to Others: No Patient denied current homicidal ideation Duty to Warn:no Physical Aggression / Violence:No  Access to Firearms a concern: No  Gang Involvement:No   Subjective: Patient stated, "awful" in response to events since last session. Patient reported her husband has had bronchitis and patient reported she had a reaction to the flu shot. Patient stated, "Im in a bad state today".  Patient stated, "I've just been tired and upset". Patient identified patient's husband being sick as a trigger for decline in mood and patient stated, "I feel my world is closing in", "I feel like I'm cut off from everything", "I feel trapped". Patient reported she is no longer driving and stated, "its like my life has been taking away and I'm not thrilled". Patient reported she feels she is cut off from [facility] and reported feeling she does not have friends in the area. Patient identified the status of her daughter's relationship is a current stressor. Patient reported  upcoming lunch plans with a neighbor. Patient reported she loves to play tennis but has not played tennis since patient/husband moved to [place]. Patient reported she likes to play mahjong, tennis, table tennis, and bowling. Patient stated, "I feel at this stage in my life its too late to be meeting new people, I'm trying to hang on to what I have".   Interventions: Cognitive Behavioral Therapy. Clinician conducted session in person at clinician's office at [facility]. Reviewed events since last session. Assessed patient's mood since last session and assessed patient's current mood. Assisted patient in exploring and identifying triggers for decline in patient's mood. Assisted patient in discussing and identifying thoughts/feelings triggered by patient no longer driving. Used clarification to better understand patient's experience and assist patient in discussing, exploring, and examining patient's thoughts and feelings related to patient's statement, "I feel like I'm cut off from everything". Challenged statements/thoughts and interpretations to help patient reframe events as it relates to patient's social network. Explored activities patient enjoys and explored opportunities for socialization.     Collaboration of Care: Other not required at this time   Diagnosis:  Adjustment disorder with mixed anxiety and depressed mood     Plan: Patient is to utilize [employer] Therapy, thought re-framing, positive self talk, and coping strategies to decrease symptoms associated with their diagnosis. Frequency: bi-weekly  Modality: individual      Long-term goal:   Reduce overall level, frequency, and intensity of the feelings of anxiety as evidenced by decrease in anxiety, worry, crying, and feeling uneasy from 3 to 4 days/week to  0 to 1 days/week per client report for at least 3 consecutive months. Target Date: 08/22/23  Progress: progressing    Short-term goal:  Develop and  implement effective and healthy communication strategies for patient to utilize to vocalize her thoughts and feelings to others in a controlled and assertive way  Target Date: 02/21/23  Progress: progressing    Identify negative self talk and negative thoughts used to reinforce low self esteem and replace with positive self talk and positive beliefs Target Date: 02/21/23  Progress: progressing     Doree Barthel, LCSW

## 2023-02-13 NOTE — Progress Notes (Signed)
                Dezi Schaner, LCSW 

## 2023-02-19 ENCOUNTER — Ambulatory Visit: Payer: Medicare Other | Admitting: Neurology

## 2023-02-19 ENCOUNTER — Encounter: Payer: Self-pay | Admitting: Neurology

## 2023-02-19 ENCOUNTER — Ambulatory Visit: Payer: Medicare Other | Admitting: Podiatry

## 2023-02-19 VITALS — BP 144/74 | HR 71 | Ht 64.0 in | Wt 137.0 lb

## 2023-02-19 DIAGNOSIS — E559 Vitamin D deficiency, unspecified: Secondary | ICD-10-CM | POA: Diagnosis not present

## 2023-02-19 DIAGNOSIS — E519 Thiamine deficiency, unspecified: Secondary | ICD-10-CM | POA: Diagnosis not present

## 2023-02-19 DIAGNOSIS — E538 Deficiency of other specified B group vitamins: Secondary | ICD-10-CM

## 2023-02-19 DIAGNOSIS — R413 Other amnesia: Secondary | ICD-10-CM

## 2023-02-19 DIAGNOSIS — G3184 Mild cognitive impairment, so stated: Secondary | ICD-10-CM
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
## 2023-02-19 NOTE — Patient Instructions (Addendum)
Blood work  Dispensing optician, also known by the brand name Blanchie Dessert, is a treatment for early-stage Alzheimer's disease:  How it works Dispensing optician is an immunotherapy drug that teaches the body's immune system to identify and remove amyloid, a protein that builds up in Alzheimer's disease.  How it's administered Donanemab is given intravenously every four weeks as an infusion that takes about 30 minutes. The recommended dosage is 700 mg for the first three doses, then 1400 mg every four weeks.  Who it's for Wynonia Sours is for people with mild cognitive impairment or mild dementia due to Alzheimer's disease.  Side effects Common side effects include headache and amyloid-related imaging abnormalities. Approval The U.S. Food and Drug Administration (FDA) approved donanemab in July 2024.  Donanemab can help people with early Alzheimer's live more independently and participate in daily life by slowing the progression of the disease. However, patients should talk to their health care provider to weigh the benefits and risks of all treatment options.

## 2023-02-22 LAB — COMPREHENSIVE METABOLIC PANEL
ALT: 12 [IU]/L (ref 0–32)
AST: 32 [IU]/L (ref 0–40)
Albumin: 4.2 g/dL (ref 3.7–4.7)
Alkaline Phosphatase: 77 [IU]/L (ref 44–121)
BUN/Creatinine Ratio: 30 — ABNORMAL HIGH (ref 12–28)
BUN: 20 mg/dL (ref 8–27)
Bilirubin Total: 0.5 mg/dL (ref 0.0–1.2)
CO2: 25 mmol/L (ref 20–29)
Calcium: 9.8 mg/dL (ref 8.7–10.3)
Chloride: 101 mmol/L (ref 96–106)
Creatinine, Ser: 0.66 mg/dL (ref 0.57–1.00)
Globulin, Total: 2.8 g/dL (ref 1.5–4.5)
Glucose: 90 mg/dL (ref 70–99)
Potassium: 4.3 mmol/L (ref 3.5–5.2)
Sodium: 142 mmol/L (ref 134–144)
Total Protein: 7 g/dL (ref 6.0–8.5)
eGFR: 85 mL/min/{1.73_m2} (ref >59)

## 2023-02-22 LAB — CBC WITH DIFFERENTIAL/PLATELET
Basophils Absolute: 0 10*3/uL (ref 0.0–0.2)
Basos: 1 %
EOS (ABSOLUTE): 0 10*3/uL (ref 0.0–0.4)
Eos: 0 %
Hematocrit: 44.4 % (ref 34.0–46.6)
Hemoglobin: 14.7 g/dL (ref 11.1–15.9)
Immature Grans (Abs): 0 10*3/uL (ref 0.0–0.1)
Immature Granulocytes: 0 %
Lymphocytes Absolute: 1.2 10*3/uL (ref 0.7–3.1)
Lymphs: 23 %
MCH: 31.9 pg (ref 26.6–33.0)
MCHC: 33.1 g/dL (ref 31.5–35.7)
MCV: 96 fL (ref 79–97)
Monocytes Absolute: 0.7 10*3/uL (ref 0.1–0.9)
Monocytes: 13 %
Neutrophils Absolute: 3.3 10*3/uL (ref 1.4–7.0)
Neutrophils: 63 %
Platelets: 205 10*3/uL (ref 150–450)
RBC: 4.61 x10E6/uL (ref 3.77–5.28)
RDW: 12.9 % (ref 11.7–15.4)
WBC: 5.2 10*3/uL (ref 3.4–10.8)

## 2023-02-22 LAB — VITAMIN D 25 HYDROXY (VIT D DEFICIENCY, FRACTURES): Vit D, 25-Hydroxy: 34 ng/mL (ref 30.0–100.0)

## 2023-02-22 LAB — B12 AND FOLATE PANEL
Folate: 8.7 ng/mL (ref >3.0)
Vitamin B-12: 1094 pg/mL (ref 232–1245)

## 2023-02-22 LAB — METHYLMALONIC ACID, SERUM: Methylmalonic Acid: 165 nmol/L (ref 0–378)

## 2023-02-22 LAB — VITAMIN B1: Thiamine: 112.5 nmol/L (ref 66.5–200.0)

## 2023-02-27 ENCOUNTER — Ambulatory Visit: Payer: Medicare Other | Admitting: Clinical

## 2023-03-29 ENCOUNTER — Encounter: Payer: Self-pay | Admitting: Neurology

## 2023-03-29 ENCOUNTER — Ambulatory Visit: Payer: Medicare Other | Admitting: Clinical

## 2023-04-02 ENCOUNTER — Encounter: Payer: Self-pay | Admitting: Podiatry

## 2023-04-02 ENCOUNTER — Ambulatory Visit: Payer: Medicare Other | Admitting: Podiatry

## 2023-04-02 DIAGNOSIS — M79675 Pain in left toe(s): Secondary | ICD-10-CM | POA: Diagnosis not present

## 2023-04-02 DIAGNOSIS — M79674 Pain in right toe(s): Secondary | ICD-10-CM | POA: Diagnosis not present

## 2023-04-02 DIAGNOSIS — B351 Tinea unguium: Secondary | ICD-10-CM | POA: Diagnosis not present

## 2023-04-02 NOTE — Progress Notes (Signed)
  Subjective:  Patient ID: Kolie Rissman, female    DOB: [birth year],  MRN: [id number]  Chief Complaint  Patient presents with   Nail Problem    "I'm here for routine foot care.  My toenails are telling me it's time, they're bothering me."    88 y.o. female presents with the above complaint. History confirmed with patient.  Her nails are thickened and elongated and painful.  Debridement has been helpful.  She has had a difficult time recently because her husband injured his face requiring hospitalization and surgery she says  Objective:  Physical Exam: warm, good capillary refill, no trophic changes or ulcerative lesions, normal DP and PT pulses and normal sensory exam.  She has thickened and elongated yellow toenails x10 with subungual debris  Assessment:   1. Pain due to onychomycosis of toenails of both feet      Plan:  Patient was evaluated and treated and all questions answered.   Discussed the etiology and treatment options for the condition in detail with the patient.  Regular intermittent debridement has been helpful in reducing pain and improving function. Recommended debridement of the nails today. Sharp and mechanical debridement performed of all painful and mycotic nails today. Nails debrided in length and thickness using a nail nipper to level of comfort. Discussed treatment options including appropriate shoe gear. Follow up as scheduled for painful nails.    Return in about 10 weeks (around 06/11/2023) for RFC.

## 2023-04-19 ENCOUNTER — Ambulatory Visit: Payer: Medicare Other | Admitting: Clinical

## 2023-05-08 ENCOUNTER — Ambulatory Visit: Payer: Medicare Other | Admitting: Clinical

## 2023-05-08 DIAGNOSIS — F4323 Adjustment disorder with mixed anxiety and depressed mood: Secondary | ICD-10-CM | POA: Diagnosis not present

## 2023-05-08 NOTE — Progress Notes (Signed)
 [facility] Counselor/Therapist Progress Note  Patient ID: Sherry Lamb, MRN: [id number],    Date: 05/08/2023  Time Spent: 1:30pm - 2:08pm : 38 minutes   Treatment Type: Individual Therapy  Reported Symptoms: confusion, decline in mood, crying at times  Mental Status Exam: Appearance:  Neat and Well Groomed     Behavior: Agitated  Motor: Normal  Speech/Language:  Clear and Coherent  Affect: Appropriate  Mood: Patient reported horrible mood  Thought process: confusion  Thought content:   Rumination  Sensory/Perceptual disturbances:   WNL  Orientation: oriented to person, place, and situation  Attention: Good  Concentration: Good  Memory: Difficulty recalling information   Fund of knowledge:  Good  Insight:   Fair  Judgment:  Good  Impulse Control: Good   Risk Assessment: Danger to Self:  No Patient denied current suicidal ideation  Self-injurious Behavior: No Danger to Others: No Patient denied current homicidal ideation Duty to Warn:no Physical Aggression / Violence:No  Access to Firearms a concern: No  Gang Involvement:No   Subjective: Patient stated, I'm not a happy camper. Patient stated, he (husband) is not doing that well. Patient reported her husband fell prior to the thanksgiving holiday and is recovering from the fall. Patient reported her husband was taken to the hospital via ambulance and was in the hospital for several days. Patient stated, it has not been a happy time. Patient reported her daughter is leaving to go to [place] for three weeks and patient stated, I feel like I'm being deserted. Patient reported her daughter was recently in a car accident and daughter's car was totaled. Patient reported feeling horrible and stated, emotionally its been a roller coaster. Patient reported crying a lot.  Patient stated, I don't want to upset him (husband) in reference to patient disclosing her feelings.  Patient reported she had written  down the time for patient's April 19, 2023 appointment as 2 pm. Patient reported she does not recall missing the October appointment and does not recall cancelling previous appointments. Patient reported concern regarding a check patient wrote and is unsure about the situation surrounding the check. Patient stated, I feel like I'm alone [place] a [place].   Interventions: Cognitive Behavioral Therapy and supportive therapy . Clinician conducted session in person at clinician's office at [facility] Reviewed events since last session. Provided supportive therapy, active listening, and validation. Explored coping mechanisms patient has implemented in response to recent stressors. Discussed patient's concerns related to recent missed/cancelled appointments. Clinician clarified and provided appointment details.      Collaboration of Care: Other not required at this time   Diagnosis:  Adjustment disorder with mixed anxiety and depressed mood     Plan: Patient is to utilize [employer] Therapy, thought re-framing, positive self talk, and coping strategies to decrease symptoms associated with their diagnosis. Frequency: bi-weekly  Modality: individual      Long-term goal:   Reduce overall level, frequency, and intensity of the feelings of anxiety as evidenced by decrease in anxiety, worry, crying, and feeling uneasy from 3 to 4 days/week to 0 to 1 days/week per client report for at least 3 consecutive months. Target Date: 08/22/23  Progress: progressing    Short-term goal:  Develop and implement effective and healthy communication strategies for patient to utilize to vocalize her thoughts and feelings to others in a controlled and assertive way  Target Date: 08/22/23  Progress: progressing    Identify negative self talk and negative thoughts used to reinforce low  self esteem and replace with positive self talk and positive beliefs Target Date: 08/22/23  Progress: progressing     Darice Seats, LCSW

## 2023-05-08 NOTE — Progress Notes (Signed)
   Doree Barthel, LCSW

## 2023-05-22 ENCOUNTER — Ambulatory Visit: Payer: Medicare Other | Admitting: Clinical

## 2023-05-24 ENCOUNTER — Other Ambulatory Visit: Payer: Self-pay | Admitting: Family

## 2023-05-24 DIAGNOSIS — G319 Degenerative disease of nervous system, unspecified: Secondary | ICD-10-CM

## 2023-05-24 DIAGNOSIS — G3184 Mild cognitive impairment, so stated: Secondary | ICD-10-CM

## 2023-05-24 DIAGNOSIS — I679 Cerebrovascular disease, unspecified: Secondary | ICD-10-CM

## 2023-05-29 ENCOUNTER — Ambulatory Visit: Payer: Medicare Other | Admitting: Clinical

## 2023-05-31 ENCOUNTER — Other Ambulatory Visit: Payer: Self-pay | Admitting: Family

## 2023-05-31 ENCOUNTER — Ambulatory Visit
Admission: RE | Admit: 2023-05-31 | Discharge: 2023-05-31 | Disposition: A | Discharge status: Home / Self Care | Payer: Medicare Other | Source: Ambulatory Visit | Attending: Family | Admitting: Family

## 2023-05-31 DIAGNOSIS — K219 Gastro-esophageal reflux disease without esophagitis: Secondary | ICD-10-CM

## 2023-05-31 DIAGNOSIS — M810 Age-related osteoporosis without current pathological fracture: Secondary | ICD-10-CM | POA: Insufficient documentation

## 2023-05-31 DIAGNOSIS — F4323 Adjustment disorder with mixed anxiety and depressed mood: Secondary | ICD-10-CM

## 2023-05-31 DIAGNOSIS — Z0000 Encounter for general adult medical examination without abnormal findings: Secondary | ICD-10-CM

## 2023-05-31 DIAGNOSIS — Z23 Encounter for immunization: Secondary | ICD-10-CM

## 2023-05-31 DIAGNOSIS — E785 Hyperlipidemia, unspecified: Secondary | ICD-10-CM

## 2023-05-31 DIAGNOSIS — Z79899 Other long term (current) drug therapy: Secondary | ICD-10-CM

## 2023-05-31 DIAGNOSIS — Z20822 Contact with and (suspected) exposure to covid-19: Secondary | ICD-10-CM

## 2023-05-31 DIAGNOSIS — E538 Deficiency of other specified B group vitamins: Secondary | ICD-10-CM

## 2023-05-31 DIAGNOSIS — G3184 Mild cognitive impairment, so stated: Secondary | ICD-10-CM

## 2023-05-31 NOTE — Progress Notes (Signed)
Patient with osteoporosis.  Has she ever taken anything for this?  Fosamax? Prolia ?

## 2023-06-01 ENCOUNTER — Telehealth: Payer: Self-pay | Admitting: Family

## 2023-06-01 NOTE — Telephone Encounter (Signed)
LVM for patient informing her that we don't have a Dr. Orvan Falconer in our office. Informed her if she had any other questions to give [place] a call back. Sending to [facility] pool in case they are aware of  Dr. Orvan Falconer they have sent patient to.   Copied from CRM [id number]. Topic: General - Call Back - No Documentation >> May 31, 2023  4:16 PM Corin V wrote: Reason for CRM: Patient called stating a Dr. Orvan Falconer had called from the clinic number. No note found in chart. Please contact patient if a Dr. Orvan Falconer is trying to reach patient with clinic.

## 2023-06-01 NOTE — Telephone Encounter (Signed)
Late entry from yesterday evening: I have called the patient and husband to complete functional activities questionnaire over the phone for [employer].  They were all the way out the door for dinner.  I have tried to call them again today but had to LVM (ok per [facility]) stating I would try to reach them again on Monday.

## 2023-06-04 NOTE — Telephone Encounter (Signed)
I called the pt and her husband again and could not reach. I LVM asking for call back and said I would try to reach them again this afternoon.

## 2023-06-04 NOTE — Telephone Encounter (Signed)
I called the pt and her husband again just now. I still could not reach. I LVM asking for call back tomorrow morning. We open at 8 am.

## 2023-06-05 ENCOUNTER — Ambulatory Visit: Payer: Medicare Other | Admitting: Clinical

## 2023-06-05 DIAGNOSIS — F4323 Adjustment disorder with mixed anxiety and depressed mood: Secondary | ICD-10-CM | POA: Diagnosis not present

## 2023-06-05 NOTE — Progress Notes (Signed)
[facility] Counselor/Therapist Progress Note  Patient ID: Sherry Lamb, MRN: [id number],    Date: 06/05/2023  Time Spent: 1:46pm - 2:23pm : 37 minutes   Treatment Type: Individual Therapy  Reported Symptoms: agitation, irritability  Mental Status Exam: Appearance:  Neat and Well Groomed     Behavior: Agitated  Motor: Normal  Speech/Language:  Clear and Coherent  Affect: Appropriate  Mood: irritable  Thought process: normal  Thought content:   WNL  Sensory/Perceptual disturbances:   Patient reported pain in patient's groin and leg  Orientation: oriented to person, place, and situation  Attention: Good  Concentration: Good  Memory: Patient exhibits short term memory loss  Fund of knowledge:  Good  Insight:   Fair  Judgment:  Good  Impulse Control: Good   Risk Assessment: Danger to Self:  No Patient denied current suicidal ideation  Self-injurious Behavior: No Danger to Others: No Patient denied current homicidal ideation Duty to Warn:no Physical Aggression / Violence:No  Access to Firearms a concern: No  Gang Involvement:No   Subjective: Patient reported experiencing pain in patient's groin and leg during session. Patient reported she woke up in pain. Patient stated, "it hasn't been a couple good days", "a lot of aggravation that's all". Patient reported patient's husband is experiencing changes in memory since husband experienced a fall in November. Patient stated, "he's (husband) short of patience", "he gets very frustrated", "its not the same its not the same".  Patient stated, "I try to keep my mouth shut". Patient stated, "its not a happy time that's all".  Patient reported she was getting ready for today's appointment and discovered her husband was not at home. Patient reported her husband came home shortly before patient's appointment. Patient reported she was sitting on the steps waiting for her husband when he arrived home. Patient stated, "I want to get  out of here and go back to [facility]". Patient reported her daughter has returned home from [place] and stated, "I'm more at ease since she's (daughter) back home". During today's session patient identified the following positives: patient's daughter went to [place] and came home safely, stated "we're in pretty good health all around", daughter was not injured in recent car accident, neighbor gave patient a box of blackberries that were good, stated "we're here, we made it". Patient stated, "today's been a hard day", "today was just an aggravating day".   Interventions: Cognitive Behavioral Therapy and supportive therapy . Clinician conducted session in person at clinician's office at [facility]. Reviewed events since last session. Provided supportive therapy, active listening, and validation as patient discussed current symptoms and changes in husband's health. Explored patient's response when husband is "short of patience". Assessed patient's current mood. Discussed patient's arrival time for today's appointment and barriers to attending appointment at scheduled time. Assisted patient in exploring and identifying positives aspects in patient's life since last session and today.  Provided psycho education related to use of a gratitude journal. Discussed patient following up with patient's PCP to discuss current symptoms/pain. Clinician requested for homework patient identify 3 positives each day.    Collaboration of Care: Other not required at this time   Diagnosis:  Adjustment disorder with mixed anxiety and depressed mood     Plan: Patient is to utilize [employer] Therapy, thought re-framing, positive self talk, and coping strategies to decrease symptoms associated with their diagnosis. Frequency: bi-weekly  Modality: individual      Long-term goal:   Reduce overall level, frequency, and intensity  of the feelings of anxiety as evidenced by decrease in anxiety, worry, crying, and  feeling uneasy from 3 to 4 days/week to 0 to 1 days/week per client report for at least 3 consecutive months. Target Date: 08/22/23  Progress: progressing    Short-term goal:  Develop and implement effective and healthy communication strategies for patient to utilize to vocalize her thoughts and feelings to others in a controlled and assertive way  Target Date: 08/22/23  Progress: progressing    Identify negative self talk and negative thoughts used to reinforce low self esteem and replace with positive self talk and positive beliefs Target Date: 08/22/23  Progress: progressing    Doree Barthel, LCSW

## 2023-06-05 NOTE — Telephone Encounter (Signed)
Was able to speak with pt's husband and her on speaker phone. FAQ score of 1 received today. All donanemab application information and order given to [employer] to submit to insurance.

## 2023-06-05 NOTE — Progress Notes (Signed)
Doree Barthel, LCSW

## 2023-06-06 ENCOUNTER — Telehealth: Payer: Self-pay | Admitting: Family

## 2023-06-06 NOTE — Telephone Encounter (Signed)
LM for pt to returncall

## 2023-06-06 NOTE — Telephone Encounter (Signed)
Recent home testing for medicare came to her home and did an assessment for peripheral arterial disease, and the test did reveal there is mild PAD in both legs. Does she have pain with walking at times?   Has she ever seen a vascular doctor? If she has not I would recommend she see vascular for an evaluation, and I can send a referral.   (She has some dementia fyi)

## 2023-06-07 NOTE — Telephone Encounter (Signed)
LM for pt to returncall

## 2023-06-08 NOTE — Telephone Encounter (Signed)
 LM for pt to returncall

## 2023-06-11 ENCOUNTER — Ambulatory Visit: Payer: Medicare Other | Admitting: Podiatry

## 2023-06-11 ENCOUNTER — Encounter: Payer: Self-pay | Admitting: Podiatry

## 2023-06-11 VITALS — Ht 64.0 in | Wt 137.0 lb

## 2023-06-11 DIAGNOSIS — B351 Tinea unguium: Secondary | ICD-10-CM

## 2023-06-11 DIAGNOSIS — M79675 Pain in left toe(s): Secondary | ICD-10-CM | POA: Diagnosis not present

## 2023-06-11 DIAGNOSIS — M79674 Pain in right toe(s): Secondary | ICD-10-CM

## 2023-06-11 NOTE — Progress Notes (Signed)
  Subjective:  Patient ID: Sherry Lamb, female    DOB: 1934-10-29,  MRN: [id number]  Chief Complaint  Patient presents with   Nail Problem    Pt is here for RFC    89 y.o. female presents with the above complaint. History confirmed with patient.  Her nails are thickened and elongated and painful.  Debridement has been helpful.  She has had a difficult time recently because her husband injured his face requiring hospitalization and surgery she says  Objective:  Physical Exam: warm, good capillary refill, no trophic changes or ulcerative lesions, normal DP and PT pulses and normal sensory exam.  She has thickened and elongated yellow toenails x10 with subungual debris  Assessment:   1. Pain due to onychomycosis of toenails of both feet      Plan:  Patient was evaluated and treated and all questions answered.   Discussed the etiology and treatment options for the condition in detail with the patient.  Regular intermittent debridement has been helpful in reducing pain and improving function. Recommended debridement of the nails today. Sharp and mechanical debridement performed of all painful and mycotic nails today. Nails debrided in length and thickness using a nail nipper to level of comfort. Discussed treatment options including appropriate shoe gear. Follow up as scheduled for painful nails.    Return in about 12 weeks (around 09/03/2023) for painful thick fungal nails.

## 2023-06-14 ENCOUNTER — Other Ambulatory Visit: Payer: Self-pay

## 2023-06-14 ENCOUNTER — Ambulatory Visit: Payer: Medicare Other | Admitting: Internal Medicine

## 2023-06-14 ENCOUNTER — Encounter: Payer: Self-pay | Admitting: Internal Medicine

## 2023-06-14 VITALS — BP 122/76 | HR 84 | Temp 97.9°F | Resp 14 | Wt 139.8 lb

## 2023-06-14 DIAGNOSIS — F0280 Dementia in other diseases classified elsewhere without behavioral disturbance: Secondary | ICD-10-CM

## 2023-06-14 DIAGNOSIS — E785 Hyperlipidemia, unspecified: Secondary | ICD-10-CM

## 2023-06-14 DIAGNOSIS — F4323 Adjustment disorder with mixed anxiety and depressed mood: Secondary | ICD-10-CM | POA: Diagnosis not present

## 2023-06-14 DIAGNOSIS — G309 Alzheimer's disease, unspecified: Secondary | ICD-10-CM

## 2023-06-14 MED ORDER — SERTRALINE HCL 25 MG PO TABS: 25.0000 mg | ORAL_TABLET | Freq: Every day | ORAL | 1 refills | Status: DC

## 2023-06-14 NOTE — Assessment & Plan Note (Signed)
 Discussed at length.  Patient feels like she is not getting any benefit from her current therapy sessions, will place a referral to psychology to try to get her set up with somebody else.  Discussed starting medications, patient is hesitant but does acknowledge that the medicines could help her feel better.  She does have a family friend who did well on Zoloft  and is willing to try low-dose Zoloft .  Information about the medication was printed for the patient to review and Zoloft  25 mg was sent to her pharmacy.  We will follow-up in 6 weeks for recheck.

## 2023-06-14 NOTE — Assessment & Plan Note (Signed)
 Plan to recheck labs in the fall, continue Crestor  5 mg.

## 2023-06-14 NOTE — Patient Instructions (Addendum)
 It was great seeing you today!  Plan discussed at today's visit: -Start low dose Zoloft  25 mg daily -Referral to Psychology placed   Follow up in: 6 weeks   Take care and let us  know if you have any questions or concerns prior to your next visit.  Dr. Bernardo  Sertraline  Tablets What is this medication? SERTRALINE  (SER tra leen) treats depression, anxiety, obsessive-compulsive disorder (OCD), post-traumatic stress disorder (PTSD), and premenstrual dysphoric disorder (PMDD). It increases the amount of serotonin in the brain, a hormone that helps regulate mood. It belongs to a group of medications called SSRIs. This medicine may be used for other purposes; ask your health care provider or pharmacist if you have questions. COMMON BRAND NAME(S): Zoloft  What should I tell my care team before I take this medication? They need to know if you have any of these conditions: Bleeding disorders Bipolar disorder or a family history of bipolar disorder Frequently drink alcohol  Glaucoma Heart disease High blood pressure History of irregular heartbeat History of low levels of calcium , magnesium, or potassium in the blood Liver disease Receiving electroconvulsive therapy Seizures Suicidal thoughts, plans, or attempt by you or a family member Take medications that prevent or treat blood clots Thyroid  disease An unusual or allergic reaction to sertraline , other medications, foods, dyes, or preservatives Pregnant or trying to get pregnant Breastfeeding How should I use this medication? Take this medication by mouth with a glass of water . Take it as directed on the prescription label at the same time every day. You can take it with or without food. If it upsets your stomach, take it with food. Do not take your medication more often than directed. Keep taking this medication unless your care team tells you to stop. Stopping it too quickly can cause serious side effects. It can also make your condition  worse. A special MedGuide will be given to you by the pharmacist with each prescription and refill. Be sure to read this information carefully each time. Talk to your care team about the use of this medication in children. While it may be prescribed for children as young as 7 years for selected conditions, precautions do apply. Overdosage: If you think you have taken too much of this medicine contact a poison control center or emergency room at once. NOTE: This medicine is only for you. Do not share this medicine with others. What if I miss a dose? If you miss a dose, take it as soon as you can. If it is almost time for your next dose, take only that dose. Do not take double or extra doses. What may interact with this medication? Do not take this medication with any of the following: Cisapride Dronedarone Linezolid MAOIs, such as Carbex, Eldepryl, Marplan, Nardil, and Parnate Methylene blue (injected into a vein) Pimozide Thioridazine This medication may also interact with the following: Alcohol  Amphetamines Aspirin  and aspirin -like medications Certain medications for fungal infections, such as ketoconazole, fluconazole, posaconazole, itraconazole Certain medications for irregular heart beat, such as flecainide, quinidine, propafenone Certain medications for mental health conditions Certain medications for migraine headaches, such as almotriptan, eletriptan, frovatriptan, naratriptan, rizatriptan, sumatriptan, zolmitriptan Certain medications for seizures, such as carbamazepine, valproic acid, phenytoin Certain medications for sleep Certain medications that prevent or treat blood clots, such as warfarin, enoxaparin, dalteparin Cimetidine Digoxin Diuretics Fentanyl  Isoniazid Lithium NSAIDs, medications for pain and inflammation, such as ibuprofen or naproxen Other medications that cause heart rhythm changes, such as dofetilide Rasagiline Safinamide Supplements, such as [facility]. John's  wort, kava kava, valerian Tolbutamide Tramadol  Tryptophan This list may not describe all possible interactions. Give your health care provider a list of all the medicines, herbs, non-prescription drugs, or dietary supplements you use. Also tell them if you smoke, drink alcohol , or use illegal drugs. Some items may interact with your medicine. What should I watch for while using this medication? Tell your care team if your symptoms do not get better or if they get worse. Visit your care team for regular checks on your progress. Because it may take several weeks to see the full effects of this medication, it is important to continue your treatment as prescribed by your care team. Patients and their families should watch out for new or worsening thoughts of suicide or depression. Also watch out for sudden changes in feelings such as feeling anxious, agitated, panicky, irritable, hostile, aggressive, impulsive, severely restless, overly excited and hyperactive, or not being able to sleep. If this happens, especially at the beginning of treatment or after a change in dose, call your care team. This medication may affect your coordination, reaction time, or judgment. Do not drive or operate machinery until you know how this medication affects you. Sit or stand up slowly to reduce the risk of dizzy or fainting spells. Drinking alcohol  with this medication can increase the risk of these side effects. Your mouth may get dry. Chewing sugarless gum or sucking hard candy, and drinking plenty of water  may help. Contact your care team if the problem does not go away or is severe. What side effects may I notice from receiving this medication? Side effects that you should report to your care team as soon as possible: Allergic reactions--skin rash, itching, hives, swelling of the face, lips, tongue, or throat Bleeding--bloody or black, tar-like stools, red or dark brown urine, vomiting blood or brown material that looks  like coffee grounds, small red or purple spots on skin, unusual bleeding or bruising Heart rhythm changes--fast or irregular heartbeat, dizziness, feeling faint or lightheaded, chest pain, trouble breathing Low sodium level--muscle weakness, fatigue, dizziness, headache, confusion Serotonin syndrome--irritability, confusion, fast or irregular heartbeat, muscle stiffness, twitching muscles, sweating, high fever, seizure, chills, vomiting, diarrhea Sudden eye pain or change in vision such as blurred vision, seeing halos around lights, vision loss Thoughts of suicide or self-harm, worsening mood Side effects that usually do not require medical attention (report these to your care team if they continue or are bothersome): Change in sex drive or performance Diarrhea Excessive sweating Nausea Tremors or shaking Upset stomach This list may not describe all possible side effects. Call your doctor for medical advice about side effects. You may report side effects to FDA at 1-800-FDA-1088. Where should I keep my medication? Keep out of the reach of children and pets. Store at room temperature between 20 and 25 degrees C (68 and 77 degrees F). Get rid of any unused medication after the expiration date. To get rid of medications that are no longer needed or expired: Take the medication to a medication take-back program. Check with your pharmacy or law enforcement to find a location. If you cannot return the medication, check the label or package insert to see if the medication should be thrown out in the garbage or flushed down the toilet. If you are not sure, ask your care team. If it is safe to put in the trash, empty the medication out of the container. Mix the medication with cat litter, dirt, coffee grounds, or other unwanted substance. Seal  the mixture in a bag or container. Put it in the trash. NOTE: This sheet is a summary. It may not cover all possible information. If you have questions about this  medicine, talk to your doctor, pharmacist, or health care provider.  2024 Elsevier/Gold Standard (2021-11-22 00:00:00)   RSV Vaccine: What You Need to Know Many vaccine information statements are available in Spanish and other languages. See classthemes.se. 1. Why get vaccinated? RSV vaccine can prevent lower respiratory tract disease caused by respiratory syncytial virus (RSV). RSV is a common respiratory virus that usually causes mild, cold-like symptoms. RSV can cause illness in people of all ages but may be especially serious for infants and older adults. RSV is the most common cause of hospitalization in [place] infants. Infants up to 33 months of age (especially those 6 months and younger) and children who were born prematurely, or who have chronic lung or heart disease, or a weakened immune system, are at increased risk of severe RSV disease. RSV infections can be dangerous for certain adults. Adults at highest risk for severe RSV disease include older adults, especially those with chronic heart or lung disease, a weakened immune system, certain other chronic medical conditions, or who live in nursing homes. RSV spreads through direct contact with the virus, such as when droplets from an infected person's cough or sneeze contact your eyes, nose, or mouth. It can also be spread by someone touching a surface, such as a doorknob, that has the virus on it, and then touching your face. Symptoms of RSV infection may include runny nose, decreased appetite, coughing, sneezing, fever, or wheezing. In very young infants, symptoms of RSV may also include irritability (fussiness), decreased activity, or apnea (pauses in breathing for more than 10 seconds). Most people recover in a week or two, but RSV can be more serious, resulting in shortness of breath and low oxygen levels. RSV can cause bronchiolitis (inflammation of the small airways in the lung) and pneumonia (infection of the lungs). RSV can also lead  to worsening of other medical conditions such as asthma, chronic obstructive pulmonary disease (a chronic disease of the lungs that makes it hard to breathe), or heart failure (when the heart cannot pump enough blood and oxygen throughout the body). Infants and older adults who get very sick from RSV may need to be hospitalized. Some may even die. 2. RSV vaccine There are two immunization options available for protecting infants against RSV: maternal vaccine for the pregnant person or preventive antibodies given to the baby. Only one of these options is needed for most babies to be protected. CDC recommends a one-time dose of RSV vaccine for pregnant people from week 32 through week 36 of pregnancy for the prevention of RSV disease in their infants during the first 6 months of life. This vaccine is recommended to be given from September through January for most of the [place] . However, in some locations (for example, the territories, [place] , [place] , and parts of [place] ), the timing of vaccination may differ based on the time of year when RSV circulates in the area. CDC recommends a one-time-dose of RSV vaccine for everyone 75 years and older and for adults 2 through 87 years of age who are at increased risk of severe RSV disease. Adults 8 through 87 years old who are at increased risk include those with chronic heart or lung disease, a weakened immune system, or certain other chronic medical conditions, and those who are residents of nursing homes. RSV vaccine  may be given at the same time as other vaccines. 3. Talk with your health care provider Tell your vaccination provider if the person getting the vaccine: Has had an allergic reaction after a previous dose of RSV vaccine, or has any severe, life-threatening allergies In some cases, your health care provider may decide to postpone RSV vaccination until a future visit.  People with minor illnesses, such as a cold, may be vaccinated. People who  are moderately or severely ill should usually wait until they recover before getting RSV vaccine.  Your health care provider can give you more information. 4. Risks of a vaccine reaction Pain, redness, and swelling where the shot is given, fatigue (feeling tired), fever, headache, nausea, diarrhea, and muscle or joint pain can happen after RSV vaccination. Serious neurologic conditions, including Guillain-Barr syndrome (GBS), have been reported after RSV vaccination in some older adults. At this time, an increased risk of GBS following RSV vaccine among persons aged 56 years and older cannot be confirmed or ruled out. Preterm birth and high blood pressure during pregnancy, including pre-eclampsia, have been reported among pregnant people who received RSV vaccine. It is unclear whether these events were caused by the vaccine. People sometimes faint after medical procedures, including vaccination. Tell your provider if you feel dizzy or have vision changes or ringing in the ears.  As with any medicine, there is a very remote chance of a vaccine causing a severe allergic reaction, other serious injury, or death. V-Safe is a safety monitoring system that lets you share with CDC how you, or your dependent, feel after getting RSV vaccine. You can find information and enroll in V-Safe radarlocations.no. 5. What if there is a serious problem? An allergic reaction could occur after the vaccinated person leaves the clinic. If you see signs of a severe allergic reaction (hives, swelling of the face and throat, difficulty breathing, a fast heartbeat, dizziness, or weakness), call 9-1-1 and get the person to the nearest hospital. For other signs that concern you, call your health care provider.  Adverse reactions should be reported to the Vaccine Adverse Event Reporting System (VAERS). Your health care provider will usually file this report, or you can do it yourself. Visit the VAERS website at www.vaers.lagents.no or  call 408 002 3613. VAERS is only for reporting reactions, and VAERS staff members do not give medical advice. 6. How can I learn more? Ask your health care provider. Call your local or state health department. Visit the website of the Food and Drug Administration (FDA) for vaccine package inserts and additional information at finderlist.no Contact the Centers for Disease Control and Prevention (CDC): Call 505-596-3108 (1-800-CDC-INFO) or Visit CDC's vaccine website at piccapture.uy Source: CDC Vaccine Information Statement RSV (Respiratory Syncytial Virus) Vaccine (02/22/2023) This same material is available at tonerpromos.no for no charge. This information is not intended to replace advice given to you by your health care provider. Make sure you discuss any questions you have with your health care provider. Document Revised: 03/20/2023 Document Reviewed: 05/10/2022 Elsevier Patient Education  2024 [employer].

## 2023-06-14 NOTE — Progress Notes (Signed)
 New Patient Office Visit  Subjective    Patient ID: Sherry Lamb, female    DOB: 1934-12-01  Age: 89 y.o. MRN: [id number]  CC:  Chief Complaint  Patient presents with   Establish Care    HPI Sherry Lamb presents to establish care.  She is here with her daughter today.  Alzheimer's dementia: -Officially diagnosed 6-8 months ago -Currently Aricept  10 mg, Namenda  10 mg, discussing potentially switching to different medications, considering Donanemab  -Following with neurology, last seen 02/19/2023 -PET scan of the brain 3/24 showed brain amyloid and is consistent with moderate to frequent neuritic beta amyloid plaques in the brain  HLD: -Medications: Crestor  5 mg -Patient is compliant with above medications and reports no side effects.  -Last lipid panel: Lipid Panel     Component Value Date/Time   CHOL 198 01/23/2022 1037   TRIG 78.0 01/23/2022 1037   HDL 81.80 01/23/2022 1037   CHOLHDL 2 01/23/2022 1037   VLDL 15.6 01/23/2022 1037   LDLCALC 100 (H) 01/23/2022 1037   Anxiety/Depression: -Having a hard time with recent dementia diagnosis, does work with a therapist [employer] but feels like she is not connecting emotionally with this person and would like to try to find something else.  She is hesitant to discuss medications her daughter feels like an SSRI could help her feel better.  She has never been on mental health medications before.     06/14/2023   11:07 AM 01/23/2023   10:09 AM 10/26/2022   10:29 AM 06/26/2022   11:07 AM 05/25/2022    9:34 AM  Depression screen PHQ 2/9  Decreased Interest 1 0 0 1 2  Down, Depressed, Hopeless 1 0 0 1 2  PHQ - 2 Score 2 0 0 2 4  Altered sleeping  0 0 0 2  Tired, decreased energy  0 0 1 2  Change in appetite  0 0 0 1  Feeling bad or failure about yourself   0 0 0 3  Trouble concentrating  0 0 0 2  Moving slowly or fidgety/restless  0 0 0 0  Suicidal thoughts  0 0 0 0  PHQ-9 Score  0 0 3 14  Difficult doing work/chores  Not  difficult at all Not difficult at all Not difficult at all    Health Maintenance: -Blood work up-to-date -Vaccines up-to-date other than RSV vaccine, discussed she can get this at the pharmacy and information printed for her to review -Patient not interested in pursuing cancer screenings at this time.  Outpatient Encounter Medications as of 06/14/2023  Medication Sig   aspirin  EC 81 MG tablet Take 1 tablet by mouth daily at 12 noon.   donepezil  (ARICEPT ) 10 MG tablet Take 1 tablet (10 mg total) by mouth at bedtime.   memantine  (NAMENDA ) 10 MG tablet Take 1 tablet (10 mg total) by mouth 2 (two) times daily.   rosuvastatin  (CRESTOR ) 5 MG tablet TAKE 1 TABLET (5 MG TOTAL) BY MOUTH DAILY.   sertraline  (ZOLOFT ) 25 MG tablet Take 1 tablet (25 mg total) by mouth daily.   CALCIUM  PO Take by mouth. (Patient not taking: Reported on 06/14/2023)   Cholecalciferol (VITAMIN D ) 50 MCG (2000 UT) CAPS Take 2,000 Units by mouth daily. (Patient not taking: Reported on 06/14/2023)   Turmeric 500 MG CAPS Take 500 mg by mouth daily.   [DISCONTINUED] Polyvinyl Alcohol -Povidone (REFRESH OP) Place 1 drop into both eyes 2 (two) times daily. (Patient not taking: Reported on 06/14/2023)  No facility-administered encounter medications on file as of 06/14/2023.    Past Medical History:  Diagnosis Date   Arthritis    Frequent headaches    Hyperlipidemia    Pneumonia    PONV (postoperative nausea and vomiting)     Past Surgical History:  Procedure Laterality Date   ABDOMINAL HYSTERECTOMY     ANKLE SURGERY     EYE SURGERY     Left hip replacement Left 2013   right hip replacement Right 2000   TONSILLECTOMY AND ADENOIDECTOMY  1943   TOTAL KNEE ARTHROPLASTY Right 02/23/2020   Procedure: TOTAL KNEE ARTHROPLASTY;  Surgeon: Melodi Lerner, MD;  Location: [facility];  Service: Orthopedics;  Laterality: Right;    TOTAL KNEE ARTHROPLASTY Left 04/18/2021   Procedure: TOTAL KNEE ARTHROPLASTY;  Surgeon: Melodi Lerner, MD;   Location: [facility];  Service: Orthopedics;  Laterality: Left;   WISDOM TOOTH EXTRACTION      Family History  Problem Relation Age of Onset   Arthritis Mother    Asthma Mother    Hearing loss Mother    Osteoporosis Mother    Heart disease Father    Hyperlipidemia Father    Kidney disease Father    Depression Brother    Hyperlipidemia Brother    Arrhythmia Brother        visual merchandiser   Dementia Maternal Grandmother    Breast cancer Neg Hx     Social History   Socioeconomic History   Marital status: Married    Spouse name: Lamar   Number of children: 1   Years of education: secretarial school   Highest education level: Not on file  Occupational History    Employer: OTHER  Tobacco Use   Smoking status: Never   Smokeless tobacco: Never  Vaping Use   Vaping status: Never Used  Substance and Sexual Activity   Alcohol  use: Yes    Alcohol /week: 4.0 standard drinks of alcohol     Types: 4 Glasses of wine per week    Comment: 1/2 glass of wine nightly   Drug use: Never   Sexual activity: Yes    Partners: Male    Birth control/protection: Post-menopausal, Surgical    Comment: Hysterectomy  Other Topics Concern   Not on file  Social History Narrative   07/07/20   From: [place], [place] to be near family   Living: with husband Lamar (1966)   Work: retired from music business - [employer]      Family: daughter [place] - no children      Enjoys: play tennis (though cannot play), reading, music      Exercise: walking, tries to do her knee therapy   Diet: generally      Safety   Seat belts: Yes    Guns: No   Safe in relationships: Yes    Social Drivers of Corporate Investment Banker Strain: Low Risk  (01/20/2022)   Overall Financial Resource Strain (CARDIA)    Difficulty of Paying Living Expenses: Not hard at all  Food Insecurity: No Food Insecurity (01/20/2022)   Hunger Vital Sign    Worried About Running Out of Food in the Last Year: Never true     Ran Out of Food in the Last Year: Never true  Transportation Needs: No Transportation Needs (01/20/2022)   PRAPARE - Administrator, Civil Service (Medical): No    Lack of Transportation (Non-Medical): No  Physical Activity: Sufficiently Active (01/20/2022)  Exercise Vital Sign    Days of Exercise per Week: 4 days    Minutes of Exercise per Session: 40 min  Stress: No Stress Concern Present (01/20/2022)   [employer] of Occupational Health - Occupational Stress Questionnaire    Feeling of Stress : Only a little  Social Connections: Moderately Integrated (01/20/2022)   Social Connection and Isolation Panel [NHANES]    Frequency of Communication with Friends and Family: More than three times a week    Frequency of Social Gatherings with Friends and Family: Never    Attends Religious Services: More than 4 times per year    Active Member of [employer] or Organizations: No    Attends Banker Meetings: Never    Marital Status: Married  Catering Manager Violence: Not At Risk (01/20/2022)   Humiliation, Afraid, Rape, and Kick questionnaire    Fear of Current or Ex-Partner: No    Emotionally Abused: No    Physically Abused: No    Sexually Abused: No    Review of Systems  Psychiatric/Behavioral:  The patient is nervous/anxious and has insomnia.         Objective    BP 122/76 (Cuff Size: Normal)   Pulse 84   Temp 97.9 F (36.6 C) (Oral)   Resp 14   Wt 139 lb 12.8 oz (63.4 kg)   SpO2 96%   BMI 24.00 kg/m   Physical Exam Constitutional:      Appearance: Normal appearance.  HENT:     Head: Normocephalic and atraumatic.     Mouth/Throat:     Mouth: Mucous membranes are moist.     Pharynx: Oropharynx is clear.  Eyes:     Extraocular Movements: Extraocular movements intact.     Conjunctiva/sclera: Conjunctivae normal.     Pupils: Pupils are equal, round, and reactive to light.  Neck:     Comments: No thyromegaly Cardiovascular:     Rate and  Rhythm: Normal rate and regular rhythm.  Pulmonary:     Effort: Pulmonary effort is normal.     Breath sounds: Normal breath sounds.  Musculoskeletal:     Cervical back: No tenderness.     Right lower leg: No edema.     Left lower leg: No edema.  Lymphadenopathy:     Cervical: No cervical adenopathy.  Skin:    General: Skin is warm and dry.  Neurological:     General: No focal deficit present.     Mental Status: She is alert. Mental status is at baseline.  Psychiatric:        Mood and Affect: Mood normal.        Behavior: Behavior normal.         Assessment & Plan:  Adjustment reaction with anxiety and depression Assessment & Plan: Discussed at length.  Patient feels like she is not getting any benefit from her current therapy sessions, will place a referral to psychology to try to get her set up with somebody else.  Discussed starting medications, patient is hesitant but does acknowledge that the medicines could help her feel better.  She does have a family friend who did well on Zoloft  and is willing to try low-dose Zoloft .  Information about the medication was printed for the patient to review and Zoloft  25 mg was sent to her pharmacy.  We will follow-up in 6 weeks for recheck.  Orders: -     Ambulatory referral to Psychology -     Sertraline  HCl; Take 1 tablet (  25 mg total) by mouth daily.  Dispense: 30 tablet; Refill: 1  Alzheimer disease (HCC) Assessment & Plan: Following with neurology, reviewed 24 along with PET scan from 3/24.  Patient is currently on Namenda  and Aricept  but may be switching therapies.   Hyperlipidemia, unspecified hyperlipidemia type Assessment & Plan: Plan to recheck labs in the fall, continue Crestor  5 mg.     Return in about 6 weeks (around 07/26/2023).   [name], DO

## 2023-06-14 NOTE — Assessment & Plan Note (Signed)
 Following with neurology, reviewed 24 along with PET scan from 3/24.  Patient is currently on Namenda  and Aricept  but may be switching therapies.

## 2023-06-19 ENCOUNTER — Ambulatory Visit: Payer: Medicare Other | Admitting: Clinical

## 2023-06-26 ENCOUNTER — Ambulatory Visit: Payer: Medicare Other | Admitting: Neurology

## 2023-06-26 VITALS — BP 141/78 | HR 82 | Ht 64.0 in | Wt 136.0 lb

## 2023-06-26 DIAGNOSIS — F0390 Unspecified dementia without behavioral disturbance: Secondary | ICD-10-CM
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
## 2023-06-26 NOTE — Patient Instructions (Addendum)
  Problems with comparing the 2 medications include: - There is no head-to-head data to compare these two therapies and any comparisons are not definitive given different trial designs, different populations, different levels of amyloid burdens and corresponding tau burdens, different prespecified time points, different post-hock analysis. - while these therapies target amyloid there is co-pathology and other influencing factors including TDP 43, alpha-synuclein, vascular disease and neuro inflammatory species. - There is no documented wash out period when stopping one and transitioning to the other medication.    Several differences between the medications::  Lecanemab:  - Every 2 weeks - Dosing is indefinite - The studies did not repeat CT amyloid PET - Primary endpoints were different, primary endpoint of this medication was the Clinical Dementia Rating Scale at 18 months which showed decrease of 27% progression compared to placebo - Different mechanism, stops plaques from forming, beginning to form and prevents further aggregation, may be more effective in earlier stages of disease Donanemab: - Monthly - Patients may be able to stop infusions as studies did show overall a high percentage met criteria to stop at 12 months visually by PET/CT.  However no guidelines on how soon toxic species that are unmeasured bounce back after cessation of treatment and what are the markers of disease/treatment monitoring  - Primary endpoint was Integrated Alzheimer's Disease rating scale at 18 months which is more comprehensive as it test both cognitive and functional aspects of Alzheimer's disease and showed approximately 35% decrease in cognitive decline as compared to placebo - Different mechanism interrupts plaques/aggregated more mature form of plaques so may be more beneficial in later stages of disease - Significantly increased risk of ARIA as compared to Leqembi especially for E4 homozygotes.  . - Given  their increased risk of ARIA there is more intense MRI monitoring

## 2023-07-06 ENCOUNTER — Other Ambulatory Visit: Payer: Self-pay | Admitting: Internal Medicine

## 2023-07-06 DIAGNOSIS — F4323 Adjustment disorder with mixed anxiety and depressed mood: Secondary | ICD-10-CM

## 2023-07-09 NOTE — Telephone Encounter (Signed)
 Requested medications are due for refill today.  o  Requested medications are on the active medications list.  yes  Last refill. 06/14/2023 #30 1 rf  Future visit scheduled.   yes  Notes to clinic.  New medication to this pt. Pt is requesting a 90 day supply.    Requested Prescriptions  Pending Prescriptions Disp Refills   sertraline (ZOLOFT) 25 MG tablet [Pharmacy Med Name: SERTRALINE HCL 25 MG TABLET] 90 tablet 1    Sig: Take 1 tablet (25 mg total) by mouth daily.     Psychiatry:  Antidepressants - SSRI - sertraline Failed - 07/09/2023 10:23 AM      Failed - Valid encounter within last 6 months    Recent Outpatient Visits   None     Future Appointments             In 3 weeks Margarita Mail, DO [facility]   In 6 months Alfonse Alpers, [place], FNP [facility] [place]            Passed - AST in normal range and within 360 days    AST  Date Value Ref Range Status  02/19/2023 32 0 - 40 IU/L Final         Passed - ALT in normal range and within 360 days    ALT  Date Value Ref Range Status  02/19/2023 12 0 - 32 IU/L Final         Passed - Completed PHQ-2 or PHQ-9 in the last 360 days

## 2023-07-12 ENCOUNTER — Ambulatory Visit: Payer: Medicare Other | Admitting: Clinical

## 2023-07-12 ENCOUNTER — Ambulatory Visit: Admitting: Clinical

## 2023-07-12 ENCOUNTER — Other Ambulatory Visit: Payer: Self-pay | Admitting: Internal Medicine

## 2023-07-16 ENCOUNTER — Other Ambulatory Visit: Payer: Self-pay | Admitting: Neurology

## 2023-07-16 ENCOUNTER — Telehealth: Payer: Self-pay | Admitting: *Deleted

## 2023-07-16 DIAGNOSIS — G309 Alzheimer's disease, unspecified: Secondary | ICD-10-CM

## 2023-07-16 DIAGNOSIS — R4189 Other symptoms and signs involving cognitive functions and awareness: Secondary | ICD-10-CM

## 2023-07-16 NOTE — Telephone Encounter (Signed)
 Holly w/ [facility] states per [employer] protocol,  we need to repeat brain MRI. The previous one is now "too old". Need one within past 1 year.

## 2023-07-16 NOTE — Telephone Encounter (Signed)
 [facility] w/ [facility] has been notified that MRI brain has been ordered.

## 2023-07-16 NOTE — Telephone Encounter (Signed)
 Ordered mri brain thanks

## 2023-07-16 NOTE — Telephone Encounter (Signed)
 no auth required sent to GI [phone]

## 2023-07-19 ENCOUNTER — Other Ambulatory Visit: Payer: Self-pay | Admitting: Neurology

## 2023-07-24 NOTE — Telephone Encounter (Signed)
 MRI canceled. I also let [facility] w/ [facility] know.

## 2023-07-24 NOTE — Addendum Note (Signed)
 Addended by: Bertram Savin on: 07/24/2023 01:09 PM   Modules accepted: Orders

## 2023-07-24 NOTE — Progress Notes (Signed)
 MRI brain canceled per Dr Lucia Gaskins.

## 2023-07-30 ENCOUNTER — Other Ambulatory Visit: Payer: Self-pay

## 2023-07-30 ENCOUNTER — Encounter: Payer: Self-pay | Admitting: Internal Medicine

## 2023-07-30 ENCOUNTER — Ambulatory Visit: Payer: Medicare Other | Admitting: Internal Medicine

## 2023-07-30 VITALS — BP 120/76 | HR 94 | Temp 97.9°F | Resp 14 | Ht 64.0 in | Wt 138.1 lb

## 2023-07-30 DIAGNOSIS — F4323 Adjustment disorder with mixed anxiety and depressed mood: Secondary | ICD-10-CM | POA: Diagnosis not present

## 2023-07-30 MED ORDER — SERTRALINE HCL 25 MG PO TABS: 25.0000 mg | ORAL_TABLET | Freq: Every day | ORAL | 1 refills | Status: DC

## 2023-07-30 NOTE — Progress Notes (Signed)
 Established Patient Office Visit  Subjective    Patient ID: Sherry Lamb, female    DOB: [birth year]  Age: 89 y.o. MRN: [id number]  CC:  Chief Complaint  Patient presents with   Follow-up    Starting zoloft 6 week check    HPI Sherry Lamb presents to recheck on medication.   Alzheimer's dementia: -Officially diagnosed in the fall of 2024 -Currently Aricept 10 mg, Namenda 10 mg, discussing potentially switching to different medications, considering Donanemab  -Following with neurology -PET scan of the brain 3/24 showed brain amyloid and is consistent with moderate to frequent neuritic beta amyloid plaques in the brain  HLD: -Medications: Crestor 5 mg -Patient is compliant with above medications and reports no side effects.  -Last lipid panel: Lipid Panel     Component Value Date/Time   CHOL 198 01/23/2022 1037   TRIG 78.0 01/23/2022 1037   HDL 81.80 01/23/2022 1037   CHOLHDL 2 01/23/2022 1037   VLDL 15.6 01/23/2022 1037   LDLCALC 100 (H) 01/23/2022 1037   Anxiety/Depression: -Mood status: stable - patient has not noticed a difference but her daughter and the administrator at [facility] have noticed a positive change -Current treatment: Zoloft 25 mg -Satisfied with current treatment?: yes - for the most part, uncertain if she wants to stay on the same dose -Duration of current treatment :  weeks -Side effects: no Medication compliance: excellent compliance Previous psychiatric medications:     07/30/2023   10:30 AM 06/14/2023   11:07 AM 01/23/2023   10:09 AM 10/26/2022   10:29 AM 06/26/2022   11:07 AM  Depression screen PHQ 2/9  Decreased Interest 0 1 0 0 1  Down, Depressed, Hopeless 0 1 0 0 1  PHQ - 2 Score 0 2 0 0 2  Altered sleeping 0  0 0 0  Tired, decreased energy 0  0 0 1  Change in appetite 0  0 0 0  Feeling bad or failure about yourself  0  0 0 0  Trouble concentrating 0  0 0 0  Moving slowly or fidgety/restless 0  0 0 0  Suicidal thoughts 0  0 0 0   PHQ-9 Score 0  0 0 3  Difficult doing work/chores Not difficult at all  Not difficult at all Not difficult at all Not difficult at all    Health Maintenance: -Blood work up-to-date -Vaccines up-to-date other than RSV vaccine, discussed she can get this at the pharmacy and information printed for her to review -Patient not interested in pursuing cancer screenings at this time.  Outpatient Encounter Medications as of 07/30/2023  Medication Sig   aspirin EC 81 MG tablet Take 1 tablet by mouth daily at 12 noon.   CALCIUM PO Take by mouth.   Cholecalciferol (VITAMIN D) 50 MCG (2000 UT) CAPS Take 2,000 Units by mouth daily.   donepezil (ARICEPT) 10 MG tablet TAKE 1 TABLET BY MOUTH EVERYDAY AT BEDTIME   memantine (NAMENDA) 10 MG tablet Take 1 tablet (10 mg total) by mouth 2 (two) times daily.   rosuvastatin (CRESTOR) 5 MG tablet TAKE 1 TABLET (5 MG TOTAL) BY MOUTH DAILY.   Turmeric 500 MG CAPS Take 500 mg by mouth daily.   [DISCONTINUED] sertraline (ZOLOFT) 25 MG tablet TAKE 1 TABLET (25 MG TOTAL) BY MOUTH DAILY.   sertraline (ZOLOFT) 25 MG tablet Take 1-2 tablets (25-50 mg total) by mouth daily.   No facility-administered encounter medications on file as of 07/30/2023.    Past  Medical History:  Diagnosis Date   Arthritis    Frequent headaches    Hyperlipidemia    Pneumonia    PONV (postoperative nausea and vomiting)     Past Surgical History:  Procedure Laterality Date   ABDOMINAL HYSTERECTOMY     ANKLE SURGERY     EYE SURGERY     Left hip replacement Left 2013   right hip replacement Right 2000   TONSILLECTOMY AND ADENOIDECTOMY  1943   TOTAL KNEE ARTHROPLASTY Right 02/23/2020   Procedure: TOTAL KNEE ARTHROPLASTY;  Surgeon: Ollen Gross, MD;  Location: [facility];  Service: Orthopedics;  Laterality: Right;    TOTAL KNEE ARTHROPLASTY Left 04/18/2021   Procedure: TOTAL KNEE ARTHROPLASTY;  Surgeon: Ollen Gross, MD;  Location: [facility];  Service: Orthopedics;  Laterality: Left;    WISDOM TOOTH EXTRACTION      Family History  Problem Relation Age of Onset   Arthritis Mother    Asthma Mother    Hearing loss Mother    Osteoporosis Mother    Heart disease Father    Hyperlipidemia Father    Kidney disease Father    Depression Brother    Hyperlipidemia Brother    Arrhythmia Brother        Visual merchandiser   Dementia Maternal Grandmother    Breast cancer Neg Hx     Social History   Socioeconomic History   Marital status: Married    Spouse name: Molly Maduro   Number of children: 1   Years of education: secretarial school   Highest education level: Not on file  Occupational History    Employer: OTHER  Tobacco Use   Smoking status: Never   Smokeless tobacco: Never  Vaping Use   Vaping status: Never Used  Substance and Sexual Activity   Alcohol use: Yes    Alcohol/week: 4.0 standard drinks of alcohol    Types: 4 Glasses of wine per week    Comment: 1/2 glass of wine nightly   Drug use: Never   Sexual activity: Yes    Partners: Male    Birth control/protection: Post-menopausal, Surgical    Comment: Hysterectomy  Other Topics Concern   Not on file  Social History Narrative   07/07/20   From: [place], [place] to be near family   Living: with husband Molly Maduro (1966)   Work: retired from music business - [employer]      Family: daughter Victorino Dike - no children      Enjoys: play tennis (though cannot play), reading, music      Exercise: walking, tries to do her knee therapy   Diet: generally      Safety   Seat belts: Yes    Guns: No   Safe in relationships: Yes    Social Drivers of Corporate investment banker Strain: Low Risk  (01/20/2022)   Overall Financial Resource Strain (CARDIA)    Difficulty of Paying Living Expenses: Not hard at all  Food Insecurity: No Food Insecurity (01/20/2022)   Hunger Vital Sign    Worried About Running Out of Food in the Last Year: Never true    Ran Out of Food in the Last Year: Never true   Transportation Needs: No Transportation Needs (01/20/2022)   PRAPARE - Administrator, Civil Service (Medical): No    Lack of Transportation (Non-Medical): No  Physical Activity: Sufficiently Active (01/20/2022)   Exercise Vital Sign    Days of Exercise per Week: 4 days  Minutes of Exercise per Session: 40 min  Stress: No Stress Concern Present (01/20/2022)   [employer] of Occupational Health - Occupational Stress Questionnaire    Feeling of Stress : Only a little  Social Connections: Moderately Integrated (01/20/2022)   Social Connection and Isolation Panel [NHANES]    Frequency of Communication with Friends and Family: More than three times a week    Frequency of Social Gatherings with Friends and Family: Never    Attends Religious Services: More than 4 times per year    Active Member of [employer] or Organizations: No    Attends Banker Meetings: Never    Marital Status: Married  Catering manager Violence: Not At Risk (01/20/2022)   Humiliation, Afraid, Rape, and Kick questionnaire    Fear of Current or Ex-Partner: No    Emotionally Abused: No    Physically Abused: No    Sexually Abused: No    Review of Systems  Psychiatric/Behavioral:  The patient is nervous/anxious and has insomnia.   All other systems reviewed and are negative.       Objective    BP 120/76 (Cuff Size: Normal)   Pulse 94   Temp 97.9 F (36.6 C) (Oral)   Resp 14   Ht 5\' 4"  (1.626 m)   Wt 138 lb 1.6 oz (62.6 kg)   SpO2 98%   BMI 23.70 kg/m   Physical Exam Constitutional:      Appearance: Normal appearance.  HENT:     Head: Normocephalic and atraumatic.  Eyes:     Conjunctiva/sclera: Conjunctivae normal.  Cardiovascular:     Rate and Rhythm: Normal rate and regular rhythm.  Pulmonary:     Effort: Pulmonary effort is normal.     Breath sounds: Normal breath sounds.  Skin:    General: Skin is warm and dry.  Neurological:     General: No focal deficit present.      Mental Status: She is alert. Mental status is at baseline.  Psychiatric:        Mood and Affect: Mood normal.        Behavior: Behavior normal.         Assessment & Plan:  Adjustment reaction with anxiety and depression Assessment & Plan: Doing better on the Zoloft, she is uncertain if she wants to dose to change. Will refill 25 mg tablets, can take 1-2 tablets daily depending on how she is feeling. Follow up in 6 months.   Orders: -     Sertraline HCl; Take 1-2 tablets (25-50 mg total) by mouth daily.  Dispense: 90 tablet; Refill: 1     Return in about 6 months (around 01/30/2024).   Margarita Mail, DO

## 2023-07-30 NOTE — Assessment & Plan Note (Signed)
 Doing better on the Zoloft, she is uncertain if she wants to dose to change. Will refill 25 mg tablets, can take 1-2 tablets daily depending on how she is feeling. Follow up in 6 months.

## 2023-07-31 ENCOUNTER — Ambulatory Visit: Admitting: Clinical

## 2023-07-31 DIAGNOSIS — F4323 Adjustment disorder with mixed anxiety and depressed mood: Secondary | ICD-10-CM

## 2023-07-31 NOTE — Progress Notes (Unsigned)
   Doree Barthel, LCSW

## 2023-07-31 NOTE — Progress Notes (Unsigned)
 [facility] Counselor/Therapist Progress Note  Patient ID: Sherry Lamb, MRN: [id number],    Date: 07/31/2023  Time Spent: 12:33pm - 1:23pm : 50 minutes  Treatment Type: Individual Therapy  Reported Symptoms: Patient reported fluctuations in mood  Mental Status Exam: Appearance:  Neat and Well Groomed     Behavior: Appropriate  Motor: Normal  Speech/Language:  Clear and Coherent and Normal Rate  Affect: Appropriate  Mood: normal  Thought process: normal  Thought content:   WNL  Sensory/Perceptual disturbances:   WNL  Orientation: oriented to person, place, and situation  Attention: Good  Concentration: Good  Memory: Patient exhibits short term memory loss  Fund of knowledge:  Good  Insight:   Fair  Judgment:  Good  Impulse Control: Good   Risk Assessment: Danger to Self:  No Patient denied current suicidal ideation  Self-injurious Behavior: No Danger to Others: No Patient denied current homicidal ideation Duty to Warn:no Physical Aggression / Violence:No  Access to Firearms a concern: No  Gang Involvement:No   Subjective: Patient stated, "up and down" in response to events since last session. Patient stated, "Its just been going along". Patient stated, "pretty good, alright, ok" and stated, "up and down" in response to mood since last session. Patient reported patient's physician recommended medication changes since last session. Patient stated, "since we've been here I've not adapted that much", "I accept it" and "I can't change it" in reference to move to [place].  Patient stated, "I'm not thrilled" in reference to living in [place]. Patient reported patient/husband moved to [place] to live close to patient's daughter. Patient stated, "I don't think so, its not my type of place" in reference to [place]. Patient identified the following positive aspects of living in [place]: close proximity to [place], close to [place] and  [place], can see the [place] from patient's home and patient stated, "oh it's pretty", seeing deer in the yard, view from patient's home, friend in the neighborhood. Patient stated, "I miss the smell of [place]", "I miss the nature", "you could smell the air, the water". Patient stated, "I feel trapped". Patient reported she attends day program at [facility] weekly and reported she enjoys attending. Patient stated, "it gives me something to look forward to" in reference to day program. Patient stated, "I don't know I haven't really started out yet" in response to current mood.   Interventions: Cognitive Behavioral Therapy and supportive therapy . Clinician conducted session in person at clinician's office at [facility]. Reviewed events since last session and assessed for changes. Assessed patient's mood. Examined patient's thoughts/feelings associated with move to [facility]. Assisted patient in exploring and identifying positive aspects of living in [place]. Provided supportive therapy, active listening, and validation as patient discussed living in [facility] and reason for move to [facility].    Collaboration of Care: Other not required at this time   Diagnosis:  Adjustment disorder with mixed anxiety and depressed mood     Plan: Patient is to utilize [employer] Therapy, thought re-framing, positive self talk, and coping strategies to decrease symptoms associated with their diagnosis. Frequency: bi-weekly  Modality: individual      Long-term goal:   Reduce overall level, frequency, and intensity of the feelings of anxiety as evidenced by decrease in anxiety, worry, crying, and feeling uneasy from 3 to 4 days/week to 0 to 1 days/week per client report for at least 3 consecutive months. Target Date: 08/22/23  Progress: progressing    Short-term goal:  Develop and implement effective and healthy communication strategies for patient to utilize to vocalize her  thoughts and feelings to others in a controlled and assertive way  Target Date: 08/22/23  Progress: progressing    Identify negative self talk and negative thoughts used to reinforce low self esteem and replace with positive self talk and positive beliefs Target Date: 08/22/23  Progress: progressing       Doree Barthel, LCSW

## 2023-08-28 ENCOUNTER — Ambulatory Visit: Admitting: Clinical

## 2023-09-03 ENCOUNTER — Encounter: Payer: Self-pay | Admitting: Podiatry

## 2023-09-03 ENCOUNTER — Ambulatory Visit: Payer: Medicare Other | Admitting: Podiatry

## 2023-09-03 VITALS — Ht 64.0 in | Wt 138.0 lb

## 2023-09-03 DIAGNOSIS — M79674 Pain in right toe(s): Secondary | ICD-10-CM | POA: Diagnosis not present

## 2023-09-03 DIAGNOSIS — B351 Tinea unguium: Secondary | ICD-10-CM | POA: Diagnosis not present

## 2023-09-03 DIAGNOSIS — M79675 Pain in left toe(s): Secondary | ICD-10-CM

## 2023-09-03 NOTE — Progress Notes (Signed)
  Subjective:  Patient ID: Sherry Lamb, female    DOB: 16-May-1934,  MRN: [id number]  Chief Complaint  Patient presents with   Nail Problem    Patient is here for a 12 wk F/U for nail fungus    89 y.o. female presents with the above complaint. History confirmed with patient.  Her nails are thickened and elongated and painful.  Debridement has been helpful. Pleased with her current function and pain with regular care  Objective:  Physical Exam: warm, good capillary refill, no trophic changes or ulcerative lesions, normal DP and PT pulses and normal sensory exam.  She has thickened and elongated yellow toenails x10 with subungual debris  Assessment:   1. Pain due to onychomycosis of toenails of both feet       Plan:  Patient was evaluated and treated and all questions answered.   Discussed the etiology and treatment options for the condition in detail with the patient.  Regular intermittent debridement has been helpful in reducing pain and improving function. Recommended debridement of the nails today. Sharp and mechanical debridement performed of all painful and mycotic nails today. Nails debrided in length and thickness using a nail nipper to level of comfort. Discussed treatment options including appropriate shoe gear. Follow up as scheduled for painful nails.    Return in about 12 weeks (around 11/26/2023) for RFC.

## 2023-09-05 ENCOUNTER — Other Ambulatory Visit: Payer: Self-pay | Admitting: Neurology

## 2023-09-10 ENCOUNTER — Other Ambulatory Visit: Payer: Self-pay | Admitting: Neurology

## 2023-09-10 ENCOUNTER — Telehealth: Payer: Self-pay | Admitting: Neurology

## 2023-09-10 DIAGNOSIS — F0390 Unspecified dementia without behavioral disturbance: Secondary | ICD-10-CM

## 2023-09-10 DIAGNOSIS — G309 Alzheimer's disease, unspecified: Secondary | ICD-10-CM

## 2023-09-10 MED ORDER — DONEPEZIL HCL 10 MG PO TABS: 10.0000 mg | ORAL_TABLET | Freq: Every day | ORAL | 4 refills | Status: AC

## 2023-09-10 MED ORDER — MEMANTINE HCL 10 MG PO TABS: 10.0000 mg | ORAL_TABLET | Freq: Two times a day (BID) | ORAL | 4 refills | Status: AC

## 2023-09-10 NOTE — Telephone Encounter (Addendum)
 Dr Tresia Fruit sent refills of Aricept  and Namenda  to pharmacy. I called the pharmacy, spoke with Fabio Holts, and let him know. They will fill.

## 2023-09-10 NOTE — Telephone Encounter (Signed)
 Patient request refill for memantine  (NAMENDA ) 10 MG tablet send to  [facility] [phone]

## 2023-09-10 NOTE — Telephone Encounter (Signed)
 Refill looks like it was sent 4/30 but pharmacy stated hadn't been filled.

## 2023-09-10 NOTE — Telephone Encounter (Signed)
 [facility] Fabio Holts) calling on behalf the family to check on status of refill Informed him refill in the physician's que. If have questions can call back to [phone]

## 2023-09-17 ENCOUNTER — Other Ambulatory Visit: Payer: Self-pay | Admitting: Internal Medicine

## 2023-09-17 DIAGNOSIS — F4323 Adjustment disorder with mixed anxiety and depressed mood: Secondary | ICD-10-CM

## 2023-09-19 NOTE — Telephone Encounter (Signed)
 Too soon for refill, refilled 07/30/23 for 90 and 1 RF.  Requested Prescriptions  Pending Prescriptions Disp Refills   sertraline  (ZOLOFT ) 25 MG tablet [Pharmacy Med Name: SERTRALINE  HCL 25 MG TABLET] 90 tablet 1    Sig: TAKE 1 TABLET (25 MG TOTAL) BY MOUTH DAILY.     Psychiatry:  Antidepressants - SSRI - sertraline  Passed - 09/19/2023  8:57 AM      Passed - AST in normal range and within 360 days    AST  Date Value Ref Range Status  02/19/2023 32 0 - 40 IU/L Final         Passed - ALT in normal range and within 360 days    ALT  Date Value Ref Range Status  02/19/2023 12 0 - 32 IU/L Final         Passed - Completed PHQ-2 or PHQ-9 in the last 360 days      Passed - Valid encounter within last 6 months    Recent Outpatient Visits           1 month ago Adjustment reaction with anxiety and depression   [facility] Rockney Cid, DO   3 months ago Adjustment reaction with anxiety and depression   [facility] Rockney Cid, DO       Future Appointments             In 4 months Rockney Cid, DO [facility], [facility]

## 2023-10-02 ENCOUNTER — Ambulatory Visit: Admitting: Clinical

## 2023-10-02 DIAGNOSIS — F4323 Adjustment disorder with mixed anxiety and depressed mood: Secondary | ICD-10-CM

## 2023-10-02 NOTE — Progress Notes (Signed)
   Doree Barthel, LCSW
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
## 2023-10-22 ENCOUNTER — Other Ambulatory Visit: Payer: Self-pay | Admitting: Family

## 2023-10-24 ENCOUNTER — Other Ambulatory Visit: Payer: Self-pay | Admitting: Internal Medicine

## 2023-10-24 DIAGNOSIS — F4323 Adjustment disorder with mixed anxiety and depressed mood: Secondary | ICD-10-CM

## 2023-10-25 ENCOUNTER — Ambulatory Visit: Admitting: Clinical

## 2023-10-25 DIAGNOSIS — F4323 Adjustment disorder with mixed anxiety and depressed mood: Secondary | ICD-10-CM

## 2023-10-25 NOTE — Progress Notes (Signed)
 [facility] Counselor/Therapist Progress Note  Patient ID: Sherry Lamb, MRN: [id number],    Date: 10/25/2023  Time Spent: 10:35am - 11:20am : 45 minutes   Treatment Type: Individual Therapy  Reported Symptoms: depressed mood  Mental Status Exam: Appearance:  Neat and Well Groomed     Behavior: Appropriate  Motor: Normal  Speech/Language:  Clear and Coherent and Normal Rate  Affect: Appropriate  Mood: depressed  Thought process: normal  Thought content:   WNL  Sensory/Perceptual disturbances:   WNL  Orientation: oriented to person, place, time/date, and situation  Attention: Good  Concentration: Good  Memory: WNL  Fund of knowledge:  Good  Insight:   Fair  Judgment:  Good  Impulse Control: Good   Risk Assessment: Danger to Self:  No Patient denied current suicidal ideation  Self-injurious Behavior: No Danger to Others: No Patient denied current homicidal ideation Duty to Warn:no Physical Aggression / Violence:No  Access to Firearms a concern: No  Gang Involvement:No   Subjective: Patient stated, up and down, not easy, things seem to go up in the air sometimes in response to events since last session. Patient reported patient is concerned about patient's husband and feels husband has been quiet and withdrawn.  Patient reported worry about husband when husband is riding his bicycle alone. Patient stated, I've not been happy, I've been upset with myself. Patient stated, I feel trapped in that house, I don't have the freedom of getting in my car and go, I just sit in my chair, I'm not happy. Patient reported patient enjoyed playing tennis in [facility] and enjoyed playing ping pong. Patient stated, I sit in my sitting room a lot. Patient reported patient enjoys playing Nutritional therapist and previously enjoyed reading. Patient stated, I can't get into the car and go and that was my freedom, your life gets smaller and smaller until you're in a box.   Patient stated,  I've learned to accept what I have.   Interventions: Cognitive Behavioral Therapy. Clinician conducted session in person at clinician's office at [facility]. Reviewed events since last session and assessed for changes. Provided supportive therapy, active listening, and validation as patient discussed patient's concerns related to patient's husband and patient no longer driving. Explored triggers for decline in mood. Provided psycho education related to depressive symptoms and inactivity. Explored and identified activities patient enjoys. Discussed strategies to increase patient's participation in enjoyable activities, such as, playing table tennis at local senior center, asking friend to go to lunch, going to a walk with husband, going to the park, going to the pool, and scheduling activities.    Collaboration of Care: Other not required at this time   Diagnosis:  Adjustment disorder with mixed anxiety and depressed mood     Plan: Patient is to utilize [employer] Therapy, thought re-framing, positive self talk, and coping strategies to decrease symptoms associated with their diagnosis. Frequency: monthly  Modality: individual      Long-term goal:   Reduce overall level, frequency, and intensity of the feelings of anxiety as evidenced by decrease in anxiety, worry, crying, and feeling uneasy from 3 to 4 days/week to 0 to 1 days/week per client report for at least 3 consecutive months. Target Date: 08/21/24  Progress: progressing    Short-term goal:  Develop and implement effective and healthy communication strategies for patient to utilize to vocalize her thoughts and feelings to others in a controlled and assertive way  Target Date: 08/22/23  Progress: goal has been  met per patient    Identify negative self talk and negative thoughts used to reinforce low self esteem and replace with positive self talk and positive beliefs Target Date: 08/21/24   Progress: progressing     Burlene Carpen, LCSW

## 2023-10-25 NOTE — Progress Notes (Signed)
   Sherry Barthel, LCSW

## 2023-10-26 NOTE — Telephone Encounter (Signed)
 Too soon for refill.  Requested Prescriptions  Pending Prescriptions Disp Refills   sertraline  (ZOLOFT ) 25 MG tablet [Pharmacy Med Name: SERTRALINE  HCL 25 MG TABLET] 180 tablet 1    Sig: TAKE 1-2 TABLETS (25-50 MG TOTAL) BY MOUTH DAILY.     Psychiatry:  Antidepressants - SSRI - sertraline  Passed - 10/26/2023  1:07 PM      Passed - AST in normal range and within 360 days    AST  Date Value Ref Range Status  02/19/2023 32 0 - 40 IU/L Final         Passed - ALT in normal range and within 360 days    ALT  Date Value Ref Range Status  02/19/2023 12 0 - 32 IU/L Final         Passed - Completed PHQ-2 or PHQ-9 in the last 360 days      Passed - Valid encounter within last 6 months    Recent Outpatient Visits           2 months ago Adjustment reaction with anxiety and depression   [facility] Rockney Cid, DO   4 months ago Adjustment reaction with anxiety and depression   [facility] Rockney Cid, DO       Future Appointments             In 3 months Rockney Cid, DO [facility], [facility]

## 2023-11-12 ENCOUNTER — Other Ambulatory Visit: Payer: Self-pay | Admitting: Internal Medicine

## 2023-11-12 DIAGNOSIS — F4323 Adjustment disorder with mixed anxiety and depressed mood: Secondary | ICD-10-CM

## 2023-11-14 NOTE — Telephone Encounter (Signed)
 Too soon for refill.  Requested Prescriptions  Pending Prescriptions Disp Refills   sertraline  (ZOLOFT ) 25 MG tablet [Pharmacy Med Name: SERTRALINE  HCL 25 MG TABLET] 90 tablet 1    Sig: TAKE 1-2 TABLETS (25-50 MG TOTAL) BY MOUTH DAILY.     Psychiatry:  Antidepressants - SSRI - sertraline  Passed - 11/14/2023  1:48 PM      Passed - AST in normal range and within 360 days    AST  Date Value Ref Range Status  02/19/2023 32 0 - 40 IU/L Final         Passed - ALT in normal range and within 360 days    ALT  Date Value Ref Range Status  02/19/2023 12 0 - 32 IU/L Final         Passed - Completed PHQ-2 or PHQ-9 in the last 360 days      Passed - Valid encounter within last 6 months    Recent Outpatient Visits           3 months ago Adjustment reaction with anxiety and depression   [facility] Bernardo Fend, DO   5 months ago Adjustment reaction with anxiety and depression   [facility] Bernardo Fend, DO       Future Appointments             In 2 months Bernardo Fend, DO [facility]

## 2023-11-26 ENCOUNTER — Ambulatory Visit: Admitting: Podiatry

## 2023-11-26 DIAGNOSIS — M79675 Pain in left toe(s): Secondary | ICD-10-CM

## 2023-11-26 DIAGNOSIS — M79674 Pain in right toe(s): Secondary | ICD-10-CM | POA: Diagnosis not present

## 2023-11-26 DIAGNOSIS — B351 Tinea unguium: Secondary | ICD-10-CM

## 2023-11-26 DIAGNOSIS — L03031 Cellulitis of right toe: Secondary | ICD-10-CM

## 2023-11-26 MED ORDER — MUPIROCIN 2 % EX OINT: 1.0000 | TOPICAL_OINTMENT | Freq: Two times a day (BID) | CUTANEOUS | 0 refills | Status: AC

## 2023-11-26 MED ORDER — CEPHALEXIN 500 MG PO CAPS: 500.0000 mg | ORAL_CAPSULE | Freq: Three times a day (TID) | ORAL | 0 refills | Status: AC

## 2023-11-26 NOTE — Progress Notes (Signed)
  Subjective:  Patient ID: Sherry Lamb, female    DOB: 1935/04/19,  MRN: [id number]  Chief Complaint  Patient presents with   Nail Problem    RM 9 Patient is here for routine foot care with nail trimming today.    89 y.o. female presents with the above complaint. History confirmed with patient.  Her nails are thickened and elongated and painful.  Debridement has been helpful. Pleased with her current function and pain with regular care  Objective:  Physical Exam: warm, good capillary refill, no trophic changes or ulcerative lesions, normal DP and PT pulses and normal sensory exam.  She has thickened and elongated yellow toenails x10 with subungual debris.  The right hallux has erythema and serous drainage and onycholysis  Assessment:   1. Pain due to onychomycosis of toenails of both feet   2. Paronychia of great toe of right foot       Plan:  Patient was evaluated and treated and all questions answered.   Discussed the etiology and treatment options for the condition in detail with the patient.  Regular intermittent debridement has been helpful in reducing pain and improving function. Recommended debridement of the nails today. Sharp and mechanical debridement performed of all painful and mycotic nails today. Nails debrided in length and thickness using a nail nipper to level of comfort. Discussed treatment options including appropriate shoe gear. Follow up as scheduled for painful nails.   She had a new issue today of an infected right great toenail.  I recommended avulsion of the nail plate which she declined to proceed with today and so I debrided the nail plate back as far as possible it is relatively well attached proximally I placed her on Keflex  recommended Epsom salt soaks and mupirocin  ointment twice daily.  Bandage applied today after debriding the nail there was some iatrogenic bleeding of the nailbed.  Post care instructions given.  Return in 2 weeks to  reevaluate.   Return in about 2 weeks (around 12/10/2023) for R hallux nail re-check.

## 2023-11-26 NOTE — Patient Instructions (Signed)
 Place 1/4 cup of epsom salts in a quart of warm tap water .  Submerge your foot or feet in the solution and soak for 20 minutes.  This soak should be done twice a day.  Next, remove your foot or feet from solution, blot dry the affected area. Apply mupirocin  ointment and cover with a bandaid or leave open to air if instructed by your doctor.   IF YOUR SKIN BECOMES IRRITATED WHILE USING THESE INSTRUCTIONS, IT IS OKAY TO SWITCH TO  WHITE VINEGAR AND WATER .  As another alternative soak, you may use antibacterial soap and water .  Monitor for any signs/symptoms of infection. Call the office immediately if any occur or go directly to the emergency room. Call with any questions/concerns.

## 2023-11-29 ENCOUNTER — Ambulatory Visit: Admitting: Clinical

## 2023-12-10 ENCOUNTER — Ambulatory Visit: Admitting: Podiatry

## 2023-12-10 VITALS — Ht 64.0 in | Wt 138.0 lb

## 2023-12-10 DIAGNOSIS — L03031 Cellulitis of right toe: Secondary | ICD-10-CM

## 2023-12-10 NOTE — Progress Notes (Signed)
  Subjective:  Patient ID: Sherry Lamb, female    DOB: 1934-12-19,  MRN: [id number]  Chief Complaint  Patient presents with   Nail Problem    RM 6 Patient is here for 2 wk f/u R hallux nail recheck. Right hallux nail is thickened and has split open.     89 y.o. female presents with the above complaint. History confirmed with patient.  Doing much better  Objective:  Physical Exam: warm, good capillary refill, no trophic changes or ulcerative lesions, normal DP and PT pulses and normal sensory exam.  She has thickened and elongated yellow toenails x10 with subungual debris.  Infection of right hallux nail has resolved.  Assessment:   1. Paronychia of great toe of right foot       Plan:  Patient was evaluated and treated and all questions answered.   Nail has healed after antibiotic soaking and mupirocin .  May leave open to air I dispensed a silicone toe cap to offload due to the sensitivity.  Follow-up with me on a regular interval or sooner if the nail becomes infected again   Return in about 9 weeks (around 02/11/2024) for RFC.

## 2024-01-14 ENCOUNTER — Encounter: Payer: Self-pay | Admitting: Internal Medicine

## 2024-01-14 ENCOUNTER — Ambulatory Visit: Admitting: Internal Medicine

## 2024-01-14 ENCOUNTER — Other Ambulatory Visit: Payer: Self-pay

## 2024-01-14 VITALS — BP 120/72 | HR 92 | Temp 98.3°F | Resp 14 | Ht 64.0 in | Wt 143.0 lb

## 2024-01-14 DIAGNOSIS — N644 Mastodynia: Secondary | ICD-10-CM | POA: Diagnosis not present

## 2024-01-14 DIAGNOSIS — Z23 Encounter for immunization: Secondary | ICD-10-CM | POA: Diagnosis not present

## 2024-01-14 DIAGNOSIS — F0280 Dementia in other diseases classified elsewhere without behavioral disturbance: Secondary | ICD-10-CM | POA: Diagnosis not present

## 2024-01-14 DIAGNOSIS — G309 Alzheimer's disease, unspecified: Secondary | ICD-10-CM

## 2024-01-14 NOTE — Addendum Note (Signed)
 Addended by: BERNARDO FEND on: 01/14/2024 05:18 PM   Modules accepted: Orders
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
## 2024-01-17 ENCOUNTER — Telehealth: Payer: Self-pay | Admitting: *Deleted

## 2024-01-17 NOTE — Patient Outreach (Signed)
 Phone call to patient's daughter to schedule initial appointment to assess for community resource needs. Appointment scheduled for 01/23/24 at 4:30pm.   Lenn Mean, LCSW [place]  [facility], [facility] Licensed Clinical Social Worker  Direct Dial: [phone]

## 2024-01-21 ENCOUNTER — Other Ambulatory Visit: Payer: Self-pay | Admitting: Internal Medicine

## 2024-01-21 DIAGNOSIS — F4323 Adjustment disorder with mixed anxiety and depressed mood: Secondary | ICD-10-CM

## 2024-01-23 ENCOUNTER — Other Ambulatory Visit: Payer: Self-pay | Admitting: *Deleted

## 2024-01-23 NOTE — Telephone Encounter (Signed)
 Requested Prescriptions  Pending Prescriptions Disp Refills   sertraline  (ZOLOFT ) 25 MG tablet [Pharmacy Med Name: SERTRALINE  HCL 25 MG TABLET] 90 tablet 0    Sig: TAKE 1-2 TABLETS (25-50 MG TOTAL) BY MOUTH DAILY.     Psychiatry:  Antidepressants - SSRI - sertraline  Passed - 01/23/2024 10:25 AM      Passed - AST in normal range and within 360 days    AST  Date Value Ref Range Status  02/19/2023 32 0 - 40 IU/L Final         Passed - ALT in normal range and within 360 days    ALT  Date Value Ref Range Status  02/19/2023 12 0 - 32 IU/L Final         Passed - Completed PHQ-2 or PHQ-9 in the last 360 days      Passed - Valid encounter within last 6 months    Recent Outpatient Visits           1 week ago Breast pain, right   [facility] Bernardo Fend, DO   5 months ago Adjustment reaction with anxiety and depression   [facility] Bernardo Fend, DO   7 months ago Adjustment reaction with anxiety and depression   [facility] Bernardo Fend, DO       Future Appointments             In 1 month Bernardo Fend, DO [facility] [place]
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
## 2024-01-25 ENCOUNTER — Encounter: Payer: Medicare Other | Admitting: Family

## 2024-01-28 ENCOUNTER — Ambulatory Visit: Admitting: Internal Medicine

## 2024-02-03 ENCOUNTER — Other Ambulatory Visit: Payer: Self-pay | Admitting: Family

## 2024-02-03 ENCOUNTER — Other Ambulatory Visit: Payer: Self-pay | Admitting: Internal Medicine

## 2024-02-03 DIAGNOSIS — F4323 Adjustment disorder with mixed anxiety and depressed mood: Secondary | ICD-10-CM

## 2024-02-18 ENCOUNTER — Ambulatory Visit: Admitting: Podiatry

## 2024-03-03 ENCOUNTER — Encounter: Payer: Self-pay | Admitting: Internal Medicine

## 2024-03-03 ENCOUNTER — Ambulatory Visit: Admitting: Internal Medicine

## 2024-03-03 ENCOUNTER — Ambulatory Visit: Admitting: Podiatry

## 2024-03-03 ENCOUNTER — Other Ambulatory Visit: Payer: Self-pay

## 2024-03-03 VITALS — BP 124/78 | HR 68 | Resp 14 | Ht 64.0 in | Wt 141.0 lb

## 2024-03-03 VITALS — Ht 64.0 in | Wt 143.0 lb

## 2024-03-03 DIAGNOSIS — B351 Tinea unguium: Secondary | ICD-10-CM | POA: Diagnosis not present

## 2024-03-03 DIAGNOSIS — M79675 Pain in left toe(s): Secondary | ICD-10-CM

## 2024-03-03 DIAGNOSIS — E559 Vitamin D deficiency, unspecified: Secondary | ICD-10-CM | POA: Diagnosis not present

## 2024-03-03 DIAGNOSIS — M810 Age-related osteoporosis without current pathological fracture: Secondary | ICD-10-CM

## 2024-03-03 DIAGNOSIS — G309 Alzheimer's disease, unspecified: Secondary | ICD-10-CM | POA: Diagnosis not present

## 2024-03-03 DIAGNOSIS — M79674 Pain in right toe(s): Secondary | ICD-10-CM | POA: Diagnosis not present

## 2024-03-03 DIAGNOSIS — E785 Hyperlipidemia, unspecified: Secondary | ICD-10-CM

## 2024-03-03 DIAGNOSIS — F0280 Dementia in other diseases classified elsewhere without behavioral disturbance: Secondary | ICD-10-CM

## 2024-03-03 LAB — CBC WITH DIFFERENTIAL/PLATELET
Absolute Lymphocytes: 1540 {cells}/uL (ref 850–3900)
Absolute Monocytes: 773 {cells}/uL (ref 200–950)
Basophils Absolute: 39 {cells}/uL (ref 0–200)
Basophils Relative: 0.7 %
Eosinophils Absolute: 28 {cells}/uL (ref 15–500)
Eosinophils Relative: 0.5 %
HCT: 41.2 % (ref 35.0–45.0)
Hemoglobin: 13.7 g/dL (ref 11.7–15.5)
MCH: 31.4 pg (ref 27.0–33.0)
MCHC: 33.3 g/dL (ref 32.0–36.0)
MCV: 94.3 fL (ref 80.0–100.0)
MPV: 11.5 fL (ref 7.5–12.5)
Monocytes Relative: 13.8 %
Neutro Abs: 3220 {cells}/uL (ref 1500–7800)
Neutrophils Relative %: 57.5 %
Platelets: 221 Thousand/uL (ref 140–400)
RBC: 4.37 Million/uL (ref 3.80–5.10)
RDW: 12.9 % (ref 11.0–15.0)
Total Lymphocyte: 27.5 %
WBC: 5.6 Thousand/uL (ref 3.8–10.8)

## 2024-03-03 LAB — COMPREHENSIVE METABOLIC PANEL WITH GFR
AG Ratio: 1.3 (calc) (ref 1.0–2.5)
ALT: 15 U/L (ref 6–29)
AST: 29 U/L (ref 10–35)
Albumin: 4 g/dL (ref 3.6–5.1)
Alkaline phosphatase (APISO): 63 U/L (ref 37–153)
BUN/Creatinine Ratio: 45 (calc) — ABNORMAL HIGH (ref 6–22)
BUN: 26 mg/dL — ABNORMAL HIGH (ref 7–25)
CO2: 29 mmol/L (ref 20–32)
Calcium: 9.3 mg/dL (ref 8.6–10.4)
Chloride: 102 mmol/L (ref 98–110)
Creat: 0.58 mg/dL — ABNORMAL LOW (ref 0.60–0.95)
Globulin: 3.1 g/dL (ref 1.9–3.7)
Glucose, Bld: 89 mg/dL (ref 65–99)
Potassium: 4.6 mmol/L (ref 3.5–5.3)
Sodium: 140 mmol/L (ref 135–146)
Total Bilirubin: 0.4 mg/dL (ref 0.2–1.2)
Total Protein: 7.1 g/dL (ref 6.1–8.1)
eGFR: 88 mL/min/1.73m2 (ref >=60)

## 2024-03-03 LAB — LIPID PANEL
Cholesterol: 243 mg/dL — ABNORMAL HIGH (ref <200)
HDL: 98 mg/dL (ref >=50)
LDL Cholesterol (Calc): 125 mg/dL — ABNORMAL HIGH
Non-HDL Cholesterol (Calc): 145 mg/dL — ABNORMAL HIGH (ref <130)
Total CHOL/HDL Ratio: 2.5 (calc) (ref <5.0)
Triglycerides: 98 mg/dL (ref <150)

## 2024-03-03 LAB — VITAMIN D 25 HYDROXY (VIT D DEFICIENCY, FRACTURES): Vit D, 25-Hydroxy: 39 ng/mL (ref 30–100)

## 2024-03-03 NOTE — Progress Notes (Signed)
 Established Patient Office Visit  Subjective    Patient ID: Sherry Lamb, female    DOB: Jan 04, 1935  Age: 89 y.o. MRN: [id number]  CC:  Chief Complaint  Patient presents with   Medical Management of Chronic Issues    6 month recheck    HPI Sherry Lamb presents to recheck on chronic medical conditions. She is here with her daughter and husband.   Discussed the use of AI scribe software for clinical note transcription with the patient, who gave verbal consent to proceed.  History of Present Illness Sherry Lamb is an 89 year old female who presents for a six-month follow-up visit.  She is currently taking sertraline , alternating between 25 mg and 50 mg doses. She is on donepezil  and memantine  for cognitive impairment. There is a discussion about her not taking a statin, despite previous records indicating she was on one. Her father is currently taking a statin, which may have caused some confusion. She has a family history of heart disease. Her vitamin D  was previously on the lower end of normal. No new concerns are expressed regarding her mood.   Alzheimer's dementia: -Officially diagnosed in the fall of 2024 -Currently Aricept  10 mg, Namenda  10 mg, discussing potentially switching to different medications, considering Donanemab  -Following with neurology -PET scan of the brain 3/24 showed brain amyloid and is consistent with moderate to frequent neuritic beta amyloid plaques in the brain  HLD: -Medications: Had been on Crestor  5 mg but no longer taking -Patient is compliant with above medications and reports no side effects.  -Last lipid panel: Lipid Panel     Component Value Date/Time   CHOL 198 01/23/2022 1037   TRIG 78.0 01/23/2022 1037   HDL 81.80 01/23/2022 1037   CHOLHDL 2 01/23/2022 1037   VLDL 15.6 01/23/2022 1037   LDLCALC 100 (H) 01/23/2022 1037   Anxiety/Depression: -Mood status: stable - patient has not noticed a difference but her daughter and the  administrator at [facility] have noticed a positive change -Current treatment: Zoloft  25-50 mg -Satisfied with current treatment?: yes  -Duration of current treatment : months -Side effects: no Medication compliance: excellent compliance Previous psychiatric medications:     07/30/2023   10:30 AM 06/14/2023   11:07 AM 01/23/2023   10:09 AM 10/26/2022   10:29 AM 06/26/2022   11:07 AM  Depression screen PHQ 2/9  Decreased Interest 0 1 0 0 1  Down, Depressed, Hopeless 0 1 0 0 1  PHQ - 2 Score 0 2 0 0 2  Altered sleeping 0  0 0 0  Tired, decreased energy 0  0 0 1  Change in appetite 0  0 0 0  Feeling bad or failure about yourself  0  0 0 0  Trouble concentrating 0  0 0 0  Moving slowly or fidgety/restless 0  0 0 0  Suicidal thoughts 0  0 0 0  PHQ-9 Score 0  0 0 3  Difficult doing work/chores Not difficult at all  Not difficult at all Not difficult at all Not difficult at all    Health Maintenance: -Blood work due -Vaccines up-to-date  and information printed for her to review -Patient not interested in pursuing cancer screenings at this time.  Outpatient Encounter Medications as of 03/03/2024  Medication Sig   aspirin  EC 81 MG tablet Take 1 tablet by mouth daily at 12 noon.   CALCIUM  PO Take by mouth.   Cholecalciferol (VITAMIN D ) 50 MCG (2000 UT) CAPS  Take 2,000 Units by mouth daily.   donepezil  (ARICEPT ) 10 MG tablet Take 1 tablet (10 mg total) by mouth at bedtime.   memantine  (NAMENDA ) 10 MG tablet Take 1 tablet (10 mg total) by mouth 2 (two) times daily.   mupirocin  ointment (BACTROBAN ) 2 % Apply 1 Application topically 2 (two) times daily.   rosuvastatin  (CRESTOR ) 5 MG tablet TAKE 1 TABLET (5 MG TOTAL) BY MOUTH DAILY.   sertraline  (ZOLOFT ) 25 MG tablet TAKE 1-2 TABLETS (25-50 MG TOTAL) BY MOUTH DAILY.   Turmeric 500 MG CAPS Take 500 mg by mouth daily.   No facility-administered encounter medications on file as of 03/03/2024.    Past Medical History:  Diagnosis Date    Arthritis    Frequent headaches    Hyperlipidemia    Pneumonia    PONV (postoperative nausea and vomiting)     Past Surgical History:  Procedure Laterality Date   ABDOMINAL HYSTERECTOMY     ANKLE SURGERY     EYE SURGERY     Left hip replacement Left 2013   right hip replacement Right 2000   TONSILLECTOMY AND ADENOIDECTOMY  1943   TOTAL KNEE ARTHROPLASTY Right 02/23/2020   Procedure: TOTAL KNEE ARTHROPLASTY;  Surgeon: Melodi Lerner, MD;  Location: [facility];  Service: Orthopedics;  Laterality: Right;    TOTAL KNEE ARTHROPLASTY Left 04/18/2021   Procedure: TOTAL KNEE ARTHROPLASTY;  Surgeon: Melodi Lerner, MD;  Location: [facility];  Service: Orthopedics;  Laterality: Left;   WISDOM TOOTH EXTRACTION      Family History  Problem Relation Age of Onset   Arthritis Mother    Asthma Mother    Hearing loss Mother    Osteoporosis Mother    Heart disease Father    Hyperlipidemia Father    Kidney disease Father    Depression Brother    Hyperlipidemia Brother    Arrhythmia Brother        visual merchandiser   Dementia Maternal Grandmother    Breast cancer Neg Hx     Social History   Socioeconomic History   Marital status: Married    Spouse name: Lamar   Number of children: 1   Years of education: secretarial school   Highest education level: Not on file  Occupational History    Employer: OTHER  Tobacco Use   Smoking status: Never   Smokeless tobacco: Never  Vaping Use   Vaping status: Never Used  Substance and Sexual Activity   Alcohol  use: Yes    Alcohol /week: 4.0 standard drinks of alcohol     Types: 4 Glasses of wine per week    Comment: 1/2 glass of wine nightly   Drug use: Never   Sexual activity: Yes    Partners: Male    Birth control/protection: Post-menopausal, Surgical    Comment: Hysterectomy  Other Topics Concern   Not on file  Social History Narrative   07/07/20   From: [place], [place] to be near family   Living: with husband Lamar (1966)   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      Family: daughter [place] - no children      Enjoys: play tennis (though cannot play), reading, music      Exercise: walking, tries to do her knee therapy   Diet: generally      Safety   Seat belts: Yes    Guns: No   Safe in relationships: Yes    Social Drivers of Health  Financial Resource Strain: Low Risk  (01/20/2022)   Overall Financial Resource Strain (CARDIA)    Difficulty of Paying Living Expenses: Not hard at all  Food Insecurity: No Food Insecurity (01/20/2022)   Hunger Vital Sign    Worried About Running Out of Food in the Last Year: Never true    Ran Out of Food in the Last Year: Never true  Transportation Needs: No Transportation Needs (01/20/2022)   PRAPARE - Administrator, Civil Service (Medical): No    Lack of Transportation (Non-Medical): No  Physical Activity: Sufficiently Active (01/20/2022)   Exercise Vital Sign    Days of Exercise per Week: 4 days    Minutes of Exercise per Session: 40 min  Stress: No Stress Concern Present (01/20/2022)   [employer] of Occupational Health - Occupational Stress Questionnaire    Feeling of Stress : Only a little  Social Connections: Moderately Integrated (01/20/2022)   Social Connection and Isolation Panel    Frequency of Communication with Friends and Family: More than three times a week    Frequency of Social Gatherings with Friends and Family: Never    Attends Religious Services: More than 4 times per year    Active Member of [employer] or Organizations: No    Attends Banker Meetings: Never    Marital Status: Married  Catering Manager Violence: Not At Risk (01/20/2022)   Humiliation, Afraid, Rape, and Kick questionnaire    Fear of Current or Ex-Partner: No    Emotionally Abused: No    Physically Abused: No    Sexually Abused: No    Review of Systems  All other systems reviewed and are negative.       Objective    BP 124/78  (Cuff Size: Normal)   Pulse 68   Resp 14   Ht 5' 4 (1.626 m)   Wt 141 lb (64 kg)   SpO2 94%   BMI 24.20 kg/m   Physical Exam Constitutional:      Appearance: Normal appearance.  HENT:     Head: Normocephalic and atraumatic.  Eyes:     Conjunctiva/sclera: Conjunctivae normal.  Cardiovascular:     Rate and Rhythm: Normal rate and regular rhythm.  Pulmonary:     Effort: Pulmonary effort is normal.     Breath sounds: Normal breath sounds.  Skin:    General: Skin is warm and dry.  Neurological:     General: No focal deficit present.     Mental Status: She is alert. Mental status is at baseline.  Psychiatric:        Mood and Affect: Mood normal.        Behavior: Behavior normal.       03/03/2024    4:28 PM 06/26/2023   12:59 PM 02/19/2023   10:31 AM 11/08/2022    3:31 PM 05/24/2022   11:18 AM 01/19/2022   11:44 AM 10/20/2019   10:24 AM  MMSE - Mini Mental State Exam  Orientation to time 5 3 4 4 4 5 5   Orientation to Place 5 4 4 4 3 4 5   Registration 3 3 3 3 3 3 3   Attention/ Calculation 2 1 1 1 1 3 5   Recall 1 3 2 3 3 2 3   Language- name 2 objects 2 2 2 2 2 2    Language- repeat 1 1 0 1 1 1 1   Language- follow 3 step command 3 2 3 3 3 3    Language- read &  follow direction 1 0 1 1 1 1    Write a sentence 1 1 1  0 1 1   Copy design 1 0 0 0 0 0   Total score 25 20 21 22 22 25           Assessment & Plan:   Assessment & Plan Adult Wellness Visit Routine six-month check-up with no acute concerns. - Order routine blood work including kidney function, liver function, electrolytes, anemia screening, cholesterol, and vitamin D  levels.  Hyperlipidemia On statin therapy. Blood work will assess cholesterol levels to determine need for statin therapy. Discussed benefits of statin therapy in reducing cardiovascular risk, given family history of heart disease. - Order cholesterol level as part of blood work. - Consider resuming statin therapy based on cholesterol  results.  Alzheimer's Diease   Continues on donepezil  and Namenda  as prescribed by neurologist. Mini mental score stable at 25 today.   Major depressive disorder Currently on sertraline  25 mg some days and 50 mg on alternate days. No significant mood concerns. No need for dosage adjustment. - Continue current sertraline  regimen of 25 mg some days and 50 mg on alternate days.  Osteoporosis - Recheck Vitamin D  levels.   - CBC w/Diff/Platelet - Comprehensive Metabolic Panel (CMET) - Lipid Profile - Vitamin D  (25 hydroxy)   Return in about 3 months (around 06/03/2024).   [name], DO

## 2024-03-03 NOTE — Progress Notes (Signed)
  Subjective:  Patient ID: Sherry Lamb, female    DOB: 04-Dec-1934,  MRN: [id number]  Chief Complaint  Patient presents with   Nail Problem    RM 6 RFC    89 y.o. female presents with the above complaint. History confirmed with patient. Doing well no new issues. She has pain with nail pressure while walking and when shoes.  Objective:  Physical Exam: warm, good capillary refill, no trophic changes or ulcerative lesions, normal DP and PT pulses and normal sensory exam.  She has thickened and elongated yellow toenails x10 with subungual debris.     Assessment:   1. Pain due to onychomycosis of toenails of both feet        Plan:  Patient was evaluated and treated and all questions answered.   Discussed the etiology and treatment options for the condition in detail with the patient. Recommended debridement of the nails today. Sharp and mechanical debridement performed of all painful and mycotic nails today. Nails debrided in length and thickness using a nail nipper to level of comfort. Follow up as needed for painful nails.    Return in about 10 weeks (around 05/12/2024) for RFC.

## 2024-03-05 ENCOUNTER — Ambulatory Visit: Payer: Self-pay | Admitting: Internal Medicine

## 2024-03-06 ENCOUNTER — Ambulatory Visit: Admitting: Clinical

## 2024-03-06 DIAGNOSIS — F4323 Adjustment disorder with mixed anxiety and depressed mood: Secondary | ICD-10-CM

## 2024-03-06 NOTE — Progress Notes (Signed)
 [facility] Counselor/Therapist Progress Note  Patient ID: Sherry Lamb, MRN: [id number],    Date: 03/06/2024  Time Spent: 1:35pm - 2:31pm : 56 minutes   Treatment Type: Individual Therapy  Reported Symptoms: Patient reported feeling unhappy and confused  Mental Status Exam: Appearance:  Neat and Well Groomed     Behavior: Appropriate  Motor: Normal  Speech/Language:  Clear and Coherent and Normal Rate  Affect: Appropriate  Mood: Patient stated, not happy and confused in response to current mood  Thought process: normal  Thought content:   WNL  Sensory/Perceptual disturbances:   WNL  Orientation: oriented to person, place, and situation  Attention: Good  Concentration: Good  Memory: Memory impairment  Fund of knowledge:  Good  Insight:   Good  Judgment:  Good  Impulse Control: Good   Risk Assessment: Danger to Self:  No Patient denied current suicidal ideation  Self-injurious Behavior: No Danger to Others: No Patient denied current homicidal ideation Duty to Warn:no Physical Aggression / Violence:No  Access to Firearms a concern: No  Gang Involvement:No   Subjective:  Patient stated, we lost a good friend of 56 years and husband reported friend died of cancer. Patient reported patient believes someone took patient's ring out of patient's closet/jewelry cabinet. Patient's husband reported patient's daughter feels patient may have misplaced the ring. Patient reported patient feels cleaning service provider may have stolen patient's ring. Patient reported patient/husband are purchasing a lock for patient's closet and patient's daughter is coming over on Friday to search for patient's ring. Patient reported patient does not want to cleaning service provider to return to patient's home. Patient reported patient has had the ring since patient was 89 years old and the ring has sentimental value. Patient stated, I resent the whole thing and reported feeling a  little disgust in response to ring. Patient stated,  today Ill be in the room, ill be standing guard in reference to cleaning service visiting patient's home. Patient stated, how can I trust this person in the other rooms, they violated my privacy and I resent it. Patient stated, if she comes today I think I'm going to tell her not to come in today. Patient reported patient feels comfortable talking with the company's owner to vocalize patient's preference. Patient stated, its upsetting, this is the worst because this is my home, this is a violation of my home. Patient stated, I feel now I can't trust anything, my trust is tarnished.  Patient stated, not happy, confused re: mood.   Interventions: Cognitive Behavioral Therapy and supportive therapy. Clinician conducted session in person at clinician's office at [facility]. Patient's husband was present during today's session at patient's request and patient provided verbal consent for husband to be present during session. Reviewed events since last session and assessed for changes. Provided supportive therapy, active listening, and validation as patient discussed loss of friend and loss of patient's ring. Explored and identified thoughts and feelings triggered by recent loss of ring.   Collaboration of Care: Other not required at this time   Diagnosis:  Adjustment disorder with mixed anxiety and depressed mood     Plan: Patient is to utilize [employer] Therapy, thought re-framing, positive self talk, and coping strategies to decrease symptoms associated with their diagnosis. Frequency: monthly  Modality: individual      Long-term goal:   Reduce overall level, frequency, and intensity of the feelings of anxiety as evidenced by decrease in anxiety, worry, crying, and feeling uneasy from 3  to 4 days/week to 0 to 1 days/week per client report for at least 3 consecutive months. Target Date: 08/21/24  Progress:  progressing    Short-term goal:  Develop and implement effective and healthy communication strategies for patient to utilize to vocalize her thoughts and feelings to others in a controlled and assertive way  Target Date: 08/22/23  Progress: goal has been met per patient    Identify negative self talk and negative thoughts used to reinforce low self esteem and replace with positive self talk and positive beliefs Target Date: 08/21/24  Progress: progressing     Darice Seats, LCSW

## 2024-03-06 NOTE — Progress Notes (Signed)
   Sherry Seats, LCSW

## 2024-03-24 ENCOUNTER — Other Ambulatory Visit: Payer: Self-pay | Admitting: Internal Medicine

## 2024-03-24 ENCOUNTER — Encounter: Payer: Self-pay | Admitting: Internal Medicine

## 2024-03-24 DIAGNOSIS — F4323 Adjustment disorder with mixed anxiety and depressed mood: Secondary | ICD-10-CM

## 2024-03-24 NOTE — Telephone Encounter (Unsigned)
 Copied from CRM #[id number]. Topic: Clinical - Medication Refill >> Mar 24, 2024 11:27 AM Ivette P wrote: Medication: sertraline  (ZOLOFT ) 25 MG tablet  Has the patient contacted their pharmacy? Yes (Agent: If no, request that the patient contact the pharmacy for the refill. If patient does not wish to contact the pharmacy document the reason why and proceed with request.) (Agent: If yes, when and what did the pharmacy advise?)  This is the patient's preferred pharmacy:  [facility] [phone] [facility] [place] - [address] [place] [address] Phone: [phone] Fax: [phone]  Is this the correct pharmacy for this prescription? Yes If no, delete pharmacy and type the correct one.   Has the prescription been filled recently? No  Is the patient out of the medication? Yes  Has the patient been seen for an appointment in the last year OR does the patient have an upcoming appointment? Yes  Can we respond through MyChart? Yes  Agent: Please be advised that Rx refills may take up to 3 business days. We ask that you follow-up with your pharmacy.

## 2024-03-25 MED ORDER — SERTRALINE HCL 25 MG PO TABS: 25.0000 mg | ORAL_TABLET | Freq: Every day | ORAL | 0 refills | Status: DC

## 2024-03-27 NOTE — Telephone Encounter (Signed)
 Duplicate request, refilled 03/25/24.  Requested Prescriptions  Pending Prescriptions Disp Refills   sertraline  (ZOLOFT ) 25 MG tablet [Pharmacy Med Name: SERTRALINE  HCL 25 MG TABLET] 90 tablet 0    Sig: TAKE 1-2 TABLETS (25-50 MG TOTAL) BY MOUTH DAILY.     Psychiatry:  Antidepressants - SSRI - sertraline  Passed - 03/27/2024  9:06 AM      Passed - AST in normal range and within 360 days    AST  Date Value Ref Range Status  03/03/2024 29 10 - 35 U/L Final         Passed - ALT in normal range and within 360 days    ALT  Date Value Ref Range Status  03/03/2024 15 6 - 29 U/L Final         Passed - Completed PHQ-2 or PHQ-9 in the last 360 days      Passed - Valid encounter within last 6 months    Recent Outpatient Visits           3 weeks ago Alzheimer disease [facility])   [facility] Bernardo Fend, DO   2 months ago Breast pain, right   [facility] Bernardo Fend, DO   8 months ago Adjustment reaction with anxiety and depression   [facility] Bernardo Fend, DO   9 months ago Adjustment reaction with anxiety and depression   [facility] Bernardo Fend, [place]

## 2024-04-01 ENCOUNTER — Encounter: Payer: Self-pay | Admitting: Internal Medicine

## 2024-04-02 ENCOUNTER — Other Ambulatory Visit: Payer: Self-pay | Admitting: Internal Medicine

## 2024-04-02 DIAGNOSIS — E785 Hyperlipidemia, unspecified: Secondary | ICD-10-CM

## 2024-04-02 MED ORDER — ROSUVASTATIN CALCIUM 5 MG PO TABS: 5.0000 mg | ORAL_TABLET | Freq: Every day | ORAL | 3 refills | Status: AC

## 2024-04-08 ENCOUNTER — Telehealth: Payer: Self-pay

## 2024-04-08 NOTE — Telephone Encounter (Signed)
 Paperwork filled out and faxed

## 2024-04-08 NOTE — Telephone Encounter (Signed)
 Copied from CRM #[id number]. Topic: Clinical - Request for Lab/Test Order >> Apr 08, 2024  9:12 AM Logan F wrote: Reason for CRM: Rockie from [facility] called to inform Sherrilyn that the TB test can be done at their facility.   [phone] Rockie

## 2024-04-17 ENCOUNTER — Ambulatory Visit: Admitting: Clinical

## 2024-04-17 DIAGNOSIS — F4323 Adjustment disorder with mixed anxiety and depressed mood: Secondary | ICD-10-CM

## 2024-04-17 NOTE — Progress Notes (Signed)
 [facility] Counselor/Therapist Progress Note  Patient ID: Sherry Lamb, MRN: [id number],    Date: 04/17/2024  Time Spent: 1:37pm - 2:24pm : 49 minutes   Treatment Type: Individual Therapy  Reported Symptoms: patient reported fluctuations in mood  Mental Status Exam: Appearance:  Neat and Well Groomed     Behavior: Appropriate  Motor: Normal  Speech/Language:  Clear and Coherent and Normal Rate  Affect: Appropriate  Mood: normal  Thought process: normal  Thought content:   WNL  Sensory/Perceptual disturbances:   WNL  Orientation: oriented to person, place, time/date, and situation  Attention: Good  Concentration: Good  Memory: Memory impairment  Fund of knowledge:  Good  Insight:   Good  Judgment:  Good  Impulse Control: Good   Risk Assessment: Danger to Self:  No Patient denied current suicidal ideation  Self-injurious Behavior: No Danger to Others: No Patient denied current homicidal ideation Duty to Warn:no Physical Aggression / Violence:No  Access to Firearms a concern: No  Gang Involvement:No   Subjective:  Patient stated, I don't know things seem to get messy, things have been up and they have been down in response to events since last session. Patient stated, I feel like there's just too much coming down on my head right now. Patient stated, I feel there's too much to be done in too little time in reference to holiday events. Patient stated, Im not driving and I feel completely boxed in. Patient reported plans to attend dinner at a neighbor's house for christmas. Patient reported the following tasks to be completed: wrapping presents, mailing holiday cards. Patient stated, I do as much as I can do. Patient stated, I think it's just the weather, the winter weather in reference to feeling boxed in. Patient stated, up and down dependent upon the day in response to mood since last session. Patient stated, its been a sad 2025 due to  sudden loss of two friends. Patient stated, Its really a big loss in reference to loss of friends. Patient stated, ok in response to current mood.  Interventions: Cognitive Behavioral Therapy and supportive therapy. Clinician conducted session in person at clinician's office at [facility]. Reviewed events since last session and assessed for changes. Explored triggers for fluctuations in mood and thoughts/feelings associated with triggers. Explored and identified tasks to be completed. Discussed strategies to complete tasks, such as, writing list of tasks, prioritizing tasks, asking husband to address cards. Discussed strategies to decrease feeling boxed in, such as, going for a walk, going outside to look at lake, asking husband to go for a drive.    Collaboration of Care: Other not required at this time   Diagnosis:  Adjustment disorder with mixed anxiety and depressed mood     Plan: Patient is to utilize [employer] Therapy, thought re-framing, positive self talk, and coping strategies to decrease symptoms associated with their diagnosis. Frequency: monthly  Modality: individual      Long-term goal:   Reduce overall level, frequency, and intensity of the feelings of anxiety as evidenced by decrease in anxiety, worry, crying, and feeling uneasy from 3 to 4 days/week to 0 to 1 days/week per client report for at least 3 consecutive months. Target Date: 08/21/24  Progress: progressing    Short-term goal:  Develop and implement effective and healthy communication strategies for patient to utilize to vocalize her thoughts and feelings to others in a controlled and assertive way  Target Date: 08/22/23  Progress: goal has been  met per patient    Identify negative self talk and negative thoughts used to reinforce low self esteem and replace with positive self talk and positive beliefs Target Date: 08/21/24  Progress: progressing    Darice Seats, LCSW

## 2024-04-17 NOTE — Progress Notes (Signed)
   Darice Seats, LCSW

## 2024-04-29 ENCOUNTER — Ambulatory Visit

## 2024-04-29 VITALS — Ht 64.0 in | Wt 141.0 lb

## 2024-04-29 DIAGNOSIS — Z0000 Encounter for general adult medical examination without abnormal findings: Secondary | ICD-10-CM | POA: Diagnosis not present

## 2024-04-29 NOTE — Patient Instructions (Addendum)
 Sherry Lamb,  Thank you for taking the time for your Medicare Wellness Visit. I appreciate your continued commitment to your health goals. Please review the care plan we discussed, and feel free to reach out if I can assist you further.  Please note that Annual Wellness Visits do not include a physical exam. Some assessments may be limited, especially if the visit was conducted virtually. If needed, we may recommend an in-person follow-up with your provider.  Ongoing Care Seeing your primary care provider every 3 to 6 months helps us  monitor your health and provide consistent, personalized care. Next office visit on 06/04/2023.  Keep up the good work.    Referrals If a referral was made during today's visit and you haven't received any updates within two weeks, please contact the referred provider directly to check on the status.  Recommended Screenings:  Health Maintenance  Topic Date Due   COVID-19 Vaccine (5 - 2025-26 season) 01/07/2024   Medicare Annual Wellness Visit  04/29/2025   Pneumococcal Vaccine for age over 72  Completed   Flu Shot  Completed   Osteoporosis screening with Bone Density Scan  Completed   Zoster (Shingles) Vaccine  Completed   Meningitis B Vaccine  Aged Out   DTaP/Tdap/Td vaccine  Discontinued       04/29/2024   11:31 AM  Advanced Directives  Does Patient Have a Medical Advance Directive? Yes  Type of Estate Agent of [place];Living will  Copy of Healthcare Power of Attorney in Chart? No - copy requested    Vision: Annual vision screenings are recommended for early detection of glaucoma, cataracts, and diabetic retinopathy. These exams can also reveal signs of chronic conditions such as diabetes and high blood pressure.  Dental: Annual dental screenings help detect early signs of oral cancer, gum disease, and other conditions linked to overall health, including heart disease and diabetes.  Please see the attached documents for  additional preventive care recommendations.

## 2024-04-29 NOTE — Progress Notes (Signed)
 "   Chief Complaint  Patient presents with   Medicare Wellness     Subjective:   Sherry Lamb is a 89 y.o. female who presents for a Medicare Annual Wellness Visit.  Visit info / Clinical Intake: Medicare Wellness Visit Type:: Subsequent Annual Wellness Visit Persons participating in visit and providing information:: patient Medicare Wellness Visit Mode:: Telephone If telephone:: video declined Since this visit was completed virtually, some vitals may be partially provided or unavailable. Missing vitals are due to the limitations of the virtual format.: Unable to obtain vitals - no equipment If Telephone or Video please confirm:: I connected with patient using audio/video enable telemedicine. I verified patient identity with two identifiers, discussed telehealth limitations, and patient agreed to proceed. Patient Location:: Home Provider Location:: Home Interpreter Needed?: No Pre-visit prep was completed: yes AWV questionnaire completed by patient prior to visit?: no Living arrangements:: lives with spouse/significant other Patient's Overall Health Status Rating: excellent Typical amount of pain: none Does pain affect daily life?: no Are you currently prescribed opioids?: no  Dietary Habits and Nutritional Risks How many meals a day?: 3 Eats fruit and vegetables daily?: yes Most meals are obtained by: preparing own meals In the last 2 weeks, have you had any of the following?: none Diabetic:: no  Functional Status Activities of Daily Living (to include ambulation/medication): Independent Ambulation: Independent with device- listed below Home Assistive Devices/Equipment: Other (Comment) (has lenses in) Medication Administration: Independent Home Management (perform basic housework or laundry): Independent Manage your own finances?: yes Primary transportation is: driving (patient husband drives her) Concerns about vision?: no *vision screening is required for WTM* Concerns  about hearing?: no  Fall Screening Falls in the past year?: 0 Number of falls in past year: 0 Was there an injury with Fall?: 0 Fall Risk Category Calculator: 0 Patient Fall Risk Level: Low Fall Risk  Fall Risk Patient at Risk for Falls Due to: No Fall Risks Fall risk Follow up: Falls evaluation completed; Falls prevention discussed  Home and Transportation Safety: All rugs have non-skid backing?: yes All stairs or steps have railings?: yes (coming in home) Grab bars in the bathtub or shower?: yes Have non-skid surface in bathtub or shower?: yes Good home lighting?: yes Regular seat belt use?: yes Hospital stays in the last year:: no  Cognitive Assessment Difficulty concentrating, remembering, or making decisions? : yes Will 6CIT or Mini Cog be Completed: no 6CIT or Mini Cog Declined: patient has a diagnosis of dementia or cognitive impairment  Advance Directives (For Healthcare) Does Patient Have a Medical Advance Directive?: Yes Type of Advance Directive: Healthcare Power of [name]; Living will Copy of Healthcare Power of Attorney in Chart?: No - copy requested Copy of Living Will in Chart?: No - copy requested  Reviewed/Updated  Reviewed/Updated: Reviewed All (Medical, Surgical, Family, Medications, Allergies, Care Teams, Patient Goals)    Allergies (verified) Penicillins, Mirtazapine , and Prednisone   Current Medications (verified) Outpatient Encounter Medications as of 04/29/2024  Medication Sig   aspirin  EC 81 MG tablet Take 1 tablet by mouth daily at 12 noon.   CALCIUM  PO Take by mouth.   Cholecalciferol (VITAMIN D ) 50 MCG (2000 UT) CAPS Take 2,000 Units by mouth daily.   donepezil  (ARICEPT ) 10 MG tablet Take 1 tablet (10 mg total) by mouth at bedtime.   memantine  (NAMENDA ) 10 MG tablet Take 1 tablet (10 mg total) by mouth 2 (two) times daily.   mupirocin  ointment (BACTROBAN ) 2 % Apply 1 Application topically 2 (two) times  daily.   rosuvastatin  (CRESTOR ) 5  MG tablet Take 1 tablet (5 mg total) by mouth daily.   sertraline  (ZOLOFT ) 25 MG tablet Take 1-2 tablets (25-50 mg total) by mouth daily.   Turmeric 500 MG CAPS Take 500 mg by mouth daily.   No facility-administered encounter medications on file as of 04/29/2024.    History: Past Medical History:  Diagnosis Date   Arthritis    Frequent headaches    Hyperlipidemia    Pneumonia    PONV (postoperative nausea and vomiting)    Past Surgical History:  Procedure Laterality Date   ABDOMINAL HYSTERECTOMY     ANKLE SURGERY     EYE SURGERY     Left hip replacement Left 2013   right hip replacement Right 2000   TONSILLECTOMY AND ADENOIDECTOMY  1943   TOTAL KNEE ARTHROPLASTY Right 02/23/2020   Procedure: TOTAL KNEE ARTHROPLASTY;  Surgeon: Melodi Lerner, MD;  Location: [facility];  Service: Orthopedics;  Laterality: Right;    TOTAL KNEE ARTHROPLASTY Left 04/18/2021   Procedure: TOTAL KNEE ARTHROPLASTY;  Surgeon: Melodi Lerner, MD;  Location: [facility];  Service: Orthopedics;  Laterality: Left;   WISDOM TOOTH EXTRACTION     Family History  Problem Relation Age of Onset   Arthritis Mother    Asthma Mother    Hearing loss Mother    Osteoporosis Mother    Heart disease Father    Hyperlipidemia Father    Kidney disease Father    Depression Brother    Hyperlipidemia Brother    Arrhythmia Brother        visual merchandiser   Dementia Maternal Grandmother    Breast cancer Neg Hx    Social History   Occupational History    Employer: OTHER   Occupation: RETIRED  Tobacco Use   Smoking status: Never   Smokeless tobacco: Never  Vaping Use   Vaping status: Never Used  Substance and Sexual Activity   Alcohol  use: Yes    Alcohol /week: 4.0 standard drinks of alcohol     Types: 4 Glasses of wine per week    Comment: 1/2 glass of wine nightly   Drug use: Never   Sexual activity: Yes    Partners: Male    Birth control/protection: Post-menopausal, Surgical    Comment: Hysterectomy   Tobacco  Counseling Counseling given: Not Answered  SDOH Screenings   Food Insecurity: No Food Insecurity (04/29/2024)  Housing: Unknown (04/29/2024)  Transportation Needs: No Transportation Needs (04/29/2024)  Utilities: Not At Risk (04/29/2024)  Alcohol  Screen: Low Risk (06/14/2023)  Depression (PHQ2-9): Low Risk (04/29/2024)  Financial Resource Strain: Low Risk (01/20/2022)  Physical Activity: Unknown (04/29/2024)  Social Connections: Socially Integrated (04/29/2024)  Stress: No Stress Concern Present (04/29/2024)  Tobacco Use: Low Risk (04/29/2024)  Health Literacy: Adequate Health Literacy (04/29/2024)   See flowsheets for full screening details  Depression Screen PHQ 2 & 9 Depression Scale- Over the past 2 weeks, how often have you been bothered by any of the following problems? Little interest or pleasure in doing things: 1 Feeling down, depressed, or hopeless (PHQ Adolescent also includes...irritable): 1 PHQ-2 Total Score: 2 Trouble falling or staying asleep, or sleeping too much: 0 Feeling tired or having little energy: 1 Poor appetite or overeating (PHQ Adolescent also includes...weight loss): 0 Feeling bad about yourself - or that you are a failure or have let yourself or your family down: 1 Trouble concentrating on things, such as reading the newspaper or watching television (PHQ Adolescent also includes...like school  work): 0 Moving or speaking so slowly that other people could have noticed. Or the opposite - being so fidgety or restless that you have been moving around a lot more than usual: 0 Thoughts that you would be better off dead, or of hurting yourself in some way: 0 PHQ-9 Total Score: 4 If you checked off any problems, how difficult have these problems made it for you to do your work, take care of things at home, or get along with other people?: Not difficult at all  Depression Treatment Depression Interventions/Treatment : EYV7-0 Score <4 Follow-up Not Indicated      Goals Addressed             This Visit's Progress    Patient Stated       Not at this time/2025             Objective:    Today's Vitals   04/29/24 1117  Weight: 141 lb (64 kg)  Height: 5' 4 (1.626 m)   Body mass index is 24.2 kg/m.  Hearing/Vision screen Hearing Screening - Comments:: Denies hearing difficulties   Vision Screening - Comments:: Has lens/UTD Immunizations and Health Maintenance Health Maintenance  Topic Date Due   COVID-19 Vaccine (5 - 2025-26 season) 01/07/2024   Medicare Annual Wellness (AWV)  04/29/2025   Pneumococcal Vaccine: 50+ Years  Completed   Influenza Vaccine  Completed   Bone Density Scan  Completed   Zoster Vaccines- Shingrix  Completed   Meningococcal B Vaccine  Aged Out   DTaP/Tdap/Td  Discontinued        Assessment/Plan:  This is a routine wellness examination for [facility].  Patient Care Team: Bernardo Fend, DO as PCP - General (Internal Medicine) Reena Roxie CROME, [place] as Physician Assistant (Orthopedic Surgery) Arnaldo Alyce BIRCH, PA-C (Orthopedic Surgery)  I have personally reviewed and noted the following in the patients chart:   Medical and social history Use of alcohol , tobacco or illicit drugs  Current medications and supplements including opioid prescriptions. Functional ability and status Nutritional status Physical activity Advanced directives List of other physicians Hospitalizations, surgeries, and ER visits in previous 12 months Vitals Screenings to include cognitive, depression, and falls Referrals and appointments  No orders of the defined types were placed in this encounter.  In addition, I have reviewed and discussed with patient certain preventive protocols, quality metrics, and best practice recommendations. A written personalized care plan for preventive services as well as general preventive health recommendations were provided to patient.   Tashaun Obey L Jadriel Saxer, CMA   04/29/2024   Return  in 1 year (on 04/29/2025).  After Visit Summary: (MyChart) Due to this being a telephonic visit, the after visit summary with patients personalized plan was offered to patient via MyChart   Nurse Notes: No voiced or noted concerns at this time "

## 2024-05-01 ENCOUNTER — Other Ambulatory Visit: Payer: Self-pay | Admitting: Internal Medicine

## 2024-05-01 DIAGNOSIS — F4323 Adjustment disorder with mixed anxiety and depressed mood: Secondary | ICD-10-CM

## 2024-05-02 NOTE — Telephone Encounter (Signed)
 Requested Prescriptions  Pending Prescriptions Disp Refills   sertraline  (ZOLOFT ) 25 MG tablet [Pharmacy Med Name: SERTRALINE  HCL 25 MG TABLET] 180 tablet 0    Sig: TAKE 1-2 TABLETS (25-50 MG TOTAL) BY MOUTH DAILY.     Psychiatry:  Antidepressants - SSRI - sertraline  Passed - 05/02/2024  4:32 PM      Passed - AST in normal range and within 360 days    AST  Date Value Ref Range Status  03/03/2024 29 10 - 35 U/L Final         Passed - ALT in normal range and within 360 days    ALT  Date Value Ref Range Status  03/03/2024 15 6 - 29 U/L Final         Passed - Completed PHQ-2 or PHQ-9 in the last 360 days      Passed - Valid encounter within last 6 months    Recent Outpatient Visits           2 months ago Alzheimer disease [facility])   [facility] Bernardo Fend, DO   3 months ago Breast pain, right   [facility] Bernardo Fend, DO   9 months ago Adjustment reaction with anxiety and depression   [facility] Bernardo Fend, DO   10 months ago Adjustment reaction with anxiety and depression   [facility] Bernardo Fend, [place]

## 2024-05-12 ENCOUNTER — Ambulatory Visit: Admitting: Podiatry

## 2024-05-12 DIAGNOSIS — M79675 Pain in left toe(s): Secondary | ICD-10-CM

## 2024-05-12 DIAGNOSIS — B351 Tinea unguium: Secondary | ICD-10-CM | POA: Diagnosis not present

## 2024-05-12 DIAGNOSIS — M79674 Pain in right toe(s): Secondary | ICD-10-CM

## 2024-05-12 NOTE — Progress Notes (Signed)
"  °  Subjective:  Patient ID: Sherry Lamb, female    DOB: 15-Aug-1934,  MRN: [id number]  Chief Complaint  Patient presents with   Nail Problem    RM 7 RFC    89 y.o. female presents with the above complaint. History confirmed with patient. Doing well no new issues. She has pain with nail pressure while walking and when shoes.  Reports no other new issues, previous debridements have been helpful in reducing pain and improving function.  Objective:  Physical Exam: warm, good capillary refill, no trophic changes or ulcerative lesions, normal DP and PT pulses and normal sensory exam.  She has thickened and elongated yellow toenails x10 with subungual debris.     Assessment:   1. Pain due to onychomycosis of toenails of both feet        Plan:  Patient was evaluated and treated and all questions answered.   Discussed the etiology and treatment options for the condition in detail with the patient. Recommended debridement of the nails today. Sharp and mechanical debridement performed of all painful and mycotic nails today. Nails debrided in length and thickness using a nail nipper to level of comfort.  They were smoothed with a rotary bur.  Follow up as needed for painful nails.    Return in about 10 weeks (around 07/21/2024) for RFC.  "

## 2024-05-23 ENCOUNTER — Other Ambulatory Visit: Payer: Self-pay | Admitting: Internal Medicine

## 2024-05-23 ENCOUNTER — Telehealth: Payer: Self-pay | Admitting: Internal Medicine

## 2024-05-23 DIAGNOSIS — F4323 Adjustment disorder with mixed anxiety and depressed mood: Secondary | ICD-10-CM

## 2024-05-23 NOTE — Telephone Encounter (Signed)
 Too soon for refill.  Requested Prescriptions  Pending Prescriptions Disp Refills   sertraline  (ZOLOFT ) 25 MG tablet 180 tablet 0    Sig: Take 1-2 tablets (25-50 mg total) by mouth daily.     Psychiatry:  Antidepressants - SSRI - sertraline  Passed - 05/23/2024  3:38 PM      Passed - AST in normal range and within 360 days    AST  Date Value Ref Range Status  03/03/2024 29 10 - 35 U/L Final         Passed - ALT in normal range and within 360 days    ALT  Date Value Ref Range Status  03/03/2024 15 6 - 29 U/L Final         Passed - Completed PHQ-2 or PHQ-9 in the last 360 days      Passed - Valid encounter within last 6 months    Recent Outpatient Visits           2 months ago Alzheimer disease [facility])   [facility] Bernardo Fend, DO   4 months ago Breast pain, right   [facility] Bernardo Fend, DO   9 months ago Adjustment reaction with anxiety and depression   [facility] Bernardo Fend, DO   11 months ago Adjustment reaction with anxiety and depression   [facility] Bernardo Fend, [place]

## 2024-05-23 NOTE — Telephone Encounter (Signed)
 Copied from CRM [id number]. Topic: Clinical - Medication Refill >> May 23, 2024  2:40 PM Lonell PEDLAR wrote: Medication: sertraline  (ZOLOFT ) 25 MG tablet  Has the patient contacted their pharmacy? Yes, no refills marked on pill bottle (Agent: If no, request that the patient contact the pharmacy for the refill. If patient does not wish to contact the pharmacy document the reason why and proceed with request.) (Agent: If yes, when and what did the pharmacy advise?)  This is the patient's preferred pharmacy:  [facility] [address], [place] - [address] [place] [address] Phone: [phone] Fax: [phone]   Is this the correct pharmacy for this prescription? Yes If no, delete pharmacy and type the correct one.   Has the prescription been filled recently? Yes  Is the patient out of the medication? Yes  Has the patient been seen for an appointment in the last year OR does the patient have an upcoming appointment? Yes  Can we respond through MyChart? No  Agent: Please be advised that Rx refills may take up to 3 business days. We ask that you follow-up with your pharmacy.

## 2024-05-23 NOTE — Telephone Encounter (Signed)
 Copied from CRM [id number]. Topic: Clinical - Medication Refill >> May 23, 2024  2:08 PM Wess RAMAN wrote: Medication: sertraline  (ZOLOFT ) 25 MG tablet   Has the patient contacted their pharmacy? No (Agent: If no, request that the patient contact the pharmacy for the refill. If patient does not wish to contact the pharmacy document the reason why and proceed with request.) (Agent: If yes, when and what did the pharmacy advise?)  This is the patient's preferred pharmacy:  [facility] [address] [place] - [address] [place] [address] Phone: [phone] Fax: [phone]   Is this the correct pharmacy for this prescription? Yes If no, delete pharmacy and type the correct one.   Has the prescription been filled recently? Yes  Is the patient out of the medication? Yes  Has the patient been seen for an appointment in the last year OR does the patient have an upcoming appointment? Yes  Can we respond through MyChart? Yes  Agent: Please be advised that Rx refills may take up to 3 business days. We ask that you follow-up with your pharmacy.

## 2024-06-03 ENCOUNTER — Ambulatory Visit: Admitting: Internal Medicine

## 2024-06-03 ENCOUNTER — Ambulatory Visit: Admitting: Clinical

## 2024-07-04 ENCOUNTER — Ambulatory Visit: Admitting: Internal Medicine

## 2024-07-10 ENCOUNTER — Ambulatory Visit: Admitting: Clinical

## 2024-07-21 ENCOUNTER — Ambulatory Visit: Admitting: Podiatry
